# Patient Record
Sex: Female | Born: 1945
Health system: Southern US, Community
[De-identification: ages and names within clinical notes are randomized; demographics above are authoritative.]

## PROBLEM LIST (undated history)

## (undated) DIAGNOSIS — R413 Other amnesia: Secondary | ICD-10-CM

## (undated) DIAGNOSIS — N189 Chronic kidney disease, unspecified: Secondary | ICD-10-CM

## (undated) DIAGNOSIS — R519 Headache, unspecified: Secondary | ICD-10-CM

## (undated) DIAGNOSIS — I5030 Unspecified diastolic (congestive) heart failure: Secondary | ICD-10-CM

## (undated) DIAGNOSIS — I10 Essential (primary) hypertension: Secondary | ICD-10-CM

## (undated) DIAGNOSIS — G479 Sleep disorder, unspecified: Secondary | ICD-10-CM

## (undated) DIAGNOSIS — R51 Headache: Secondary | ICD-10-CM

## (undated) DIAGNOSIS — Z9289 Personal history of other medical treatment: Secondary | ICD-10-CM

## (undated) DIAGNOSIS — D126 Benign neoplasm of colon, unspecified: Secondary | ICD-10-CM

## (undated) DIAGNOSIS — D649 Anemia, unspecified: Secondary | ICD-10-CM

## (undated) DIAGNOSIS — G4700 Insomnia, unspecified: Secondary | ICD-10-CM

## (undated) DIAGNOSIS — I472 Ventricular tachycardia: Secondary | ICD-10-CM

## (undated) DIAGNOSIS — G629 Polyneuropathy, unspecified: Secondary | ICD-10-CM

## (undated) DIAGNOSIS — M069 Rheumatoid arthritis, unspecified: Secondary | ICD-10-CM

## (undated) DIAGNOSIS — E785 Hyperlipidemia, unspecified: Secondary | ICD-10-CM

## (undated) DIAGNOSIS — E119 Type 2 diabetes mellitus without complications: Secondary | ICD-10-CM

## (undated) DIAGNOSIS — F329 Major depressive disorder, single episode, unspecified: Secondary | ICD-10-CM

## (undated) DIAGNOSIS — F32A Depression, unspecified: Secondary | ICD-10-CM

## (undated) DIAGNOSIS — G2581 Restless legs syndrome: Secondary | ICD-10-CM

## (undated) DIAGNOSIS — I4721 Torsades de pointes: Secondary | ICD-10-CM

## (undated) DIAGNOSIS — I4819 Other persistent atrial fibrillation: Secondary | ICD-10-CM

## (undated) DIAGNOSIS — K219 Gastro-esophageal reflux disease without esophagitis: Secondary | ICD-10-CM

## (undated) DIAGNOSIS — I4891 Unspecified atrial fibrillation: Secondary | ICD-10-CM

## (undated) DIAGNOSIS — M1990 Unspecified osteoarthritis, unspecified site: Secondary | ICD-10-CM

## (undated) DIAGNOSIS — I519 Heart disease, unspecified: Secondary | ICD-10-CM

## (undated) HISTORY — DX: Other persistent atrial fibrillation: I48.19

## (undated) HISTORY — DX: Rheumatoid arthritis, unspecified: M06.9

## (undated) HISTORY — DX: Unspecified atrial fibrillation: I48.91

## (undated) HISTORY — DX: Chronic kidney disease, unspecified: N18.9

## (undated) HISTORY — DX: Insomnia, unspecified: G47.00

## (undated) HISTORY — DX: Other amnesia: R41.3

## (undated) HISTORY — DX: Restless legs syndrome: G25.81

## (undated) HISTORY — DX: Polyneuropathy, unspecified: G62.9

## (undated) HISTORY — PX: TUBAL LIGATION: SHX77

## (undated) HISTORY — DX: Unspecified diastolic (congestive) heart failure: I50.30

---

## 1998-02-02 ENCOUNTER — Other Ambulatory Visit: Admission: RE | Admit: 1998-02-02 | Discharge: 1998-02-02 | Payer: Self-pay | Admitting: Internal Medicine

## 2000-04-17 ENCOUNTER — Encounter: Payer: Self-pay | Admitting: Internal Medicine

## 2000-04-17 ENCOUNTER — Encounter: Admission: RE | Admit: 2000-04-17 | Discharge: 2000-04-17 | Payer: Self-pay | Admitting: Internal Medicine

## 2004-09-03 ENCOUNTER — Emergency Department (HOSPITAL_COMMUNITY): Admission: EM | Admit: 2004-09-03 | Discharge: 2004-09-04 | Payer: Self-pay | Admitting: Emergency Medicine

## 2005-06-06 ENCOUNTER — Other Ambulatory Visit: Admission: RE | Admit: 2005-06-06 | Discharge: 2005-06-06 | Payer: Self-pay | Admitting: Internal Medicine

## 2012-08-25 ENCOUNTER — Inpatient Hospital Stay (HOSPITAL_COMMUNITY)
Admission: AD | Admit: 2012-08-25 | Discharge: 2012-08-30 | DRG: 308 | Disposition: A | Discharge status: Home / Self Care | Payer: Medicare Other | Source: Ambulatory Visit | Attending: Cardiovascular Disease | Admitting: Cardiovascular Disease

## 2012-08-25 ENCOUNTER — Encounter (HOSPITAL_COMMUNITY): Payer: Self-pay | Admitting: Cardiology

## 2012-08-25 ENCOUNTER — Inpatient Hospital Stay (HOSPITAL_COMMUNITY): Payer: Medicare Other

## 2012-08-25 DIAGNOSIS — I428 Other cardiomyopathies: Secondary | ICD-10-CM | POA: Diagnosis present

## 2012-08-25 DIAGNOSIS — I10 Essential (primary) hypertension: Secondary | ICD-10-CM | POA: Diagnosis present

## 2012-08-25 DIAGNOSIS — Z833 Family history of diabetes mellitus: Secondary | ICD-10-CM

## 2012-08-25 DIAGNOSIS — I509 Heart failure, unspecified: Secondary | ICD-10-CM | POA: Diagnosis present

## 2012-08-25 DIAGNOSIS — E785 Hyperlipidemia, unspecified: Secondary | ICD-10-CM | POA: Diagnosis present

## 2012-08-25 DIAGNOSIS — I5033 Acute on chronic diastolic (congestive) heart failure: Secondary | ICD-10-CM | POA: Diagnosis present

## 2012-08-25 DIAGNOSIS — I4891 Unspecified atrial fibrillation: Principal | ICD-10-CM | POA: Diagnosis present

## 2012-08-25 DIAGNOSIS — Z79899 Other long term (current) drug therapy: Secondary | ICD-10-CM

## 2012-08-25 DIAGNOSIS — I4819 Other persistent atrial fibrillation: Secondary | ICD-10-CM | POA: Diagnosis present

## 2012-08-25 DIAGNOSIS — Z794 Long term (current) use of insulin: Secondary | ICD-10-CM

## 2012-08-25 DIAGNOSIS — E119 Type 2 diabetes mellitus without complications: Secondary | ICD-10-CM | POA: Diagnosis present

## 2012-08-25 DIAGNOSIS — Z7982 Long term (current) use of aspirin: Secondary | ICD-10-CM

## 2012-08-25 DIAGNOSIS — Z7901 Long term (current) use of anticoagulants: Secondary | ICD-10-CM

## 2012-08-25 DIAGNOSIS — Z8249 Family history of ischemic heart disease and other diseases of the circulatory system: Secondary | ICD-10-CM

## 2012-08-25 HISTORY — DX: Essential (primary) hypertension: I10

## 2012-08-25 HISTORY — DX: Type 2 diabetes mellitus without complications: E11.9

## 2012-08-25 HISTORY — DX: Depression, unspecified: F32.A

## 2012-08-25 HISTORY — DX: Hyperlipidemia, unspecified: E78.5

## 2012-08-25 HISTORY — DX: Heart disease, unspecified: I51.9

## 2012-08-25 HISTORY — DX: Major depressive disorder, single episode, unspecified: F32.9

## 2012-08-25 HISTORY — DX: Gastro-esophageal reflux disease without esophagitis: K21.9

## 2012-08-25 LAB — COMPREHENSIVE METABOLIC PANEL
ALT: 11 U/L (ref 0–35)
AST: 13 U/L (ref 0–37)
Albumin: 3.7 g/dL (ref 3.5–5.2)
Alkaline Phosphatase: 63 U/L (ref 39–117)
BUN: 15 mg/dL (ref 6–23)
CO2: 27 mEq/L (ref 19–32)
Calcium: 9.8 mg/dL (ref 8.4–10.5)
Chloride: 100 mEq/L (ref 96–112)
Creatinine, Ser: 0.82 mg/dL (ref 0.50–1.10)
GFR calc Af Amer: 83 mL/min — ABNORMAL LOW (ref >90)
GFR calc non Af Amer: 72 mL/min — ABNORMAL LOW (ref >90)
Glucose, Bld: 233 mg/dL — ABNORMAL HIGH (ref 70–99)
Potassium: 4.3 mEq/L (ref 3.5–5.1)
Sodium: 137 mEq/L (ref 135–145)
Total Bilirubin: 0.3 mg/dL (ref 0.3–1.2)
Total Protein: 7.6 g/dL (ref 6.0–8.3)

## 2012-08-25 LAB — CBC WITH DIFFERENTIAL/PLATELET
Basophils Absolute: 0 10*3/uL (ref 0.0–0.1)
Basophils Relative: 0 % (ref 0–1)
Eosinophils Absolute: 0.2 10*3/uL (ref 0.0–0.7)
Eosinophils Relative: 2 % (ref 0–5)
HCT: 31.4 % — ABNORMAL LOW (ref 36.0–46.0)
Hemoglobin: 10 g/dL — ABNORMAL LOW (ref 12.0–15.0)
Lymphocytes Relative: 15 % (ref 12–46)
Lymphs Abs: 1.4 10*3/uL (ref 0.7–4.0)
MCH: 26.2 pg (ref 26.0–34.0)
MCHC: 31.8 g/dL (ref 30.0–36.0)
MCV: 82.2 fL (ref 78.0–100.0)
Monocytes Absolute: 0.5 10*3/uL (ref 0.1–1.0)
Monocytes Relative: 5 % (ref 3–12)
Neutro Abs: 7.4 10*3/uL (ref 1.7–7.7)
Neutrophils Relative %: 78 % — ABNORMAL HIGH (ref 43–77)
Platelets: 394 10*3/uL (ref 150–400)
RBC: 3.82 MIL/uL — ABNORMAL LOW (ref 3.87–5.11)
RDW: 16.2 % — ABNORMAL HIGH (ref 11.5–15.5)
WBC: 9.5 10*3/uL (ref 4.0–10.5)

## 2012-08-25 LAB — TROPONIN I
Troponin I: <0.3 ng/mL (ref <0.30)
Troponin I: <0.3 ng/mL (ref <0.30)

## 2012-08-25 LAB — GLUCOSE, CAPILLARY
Glucose-Capillary: 112 mg/dL — ABNORMAL HIGH (ref 70–99)
Glucose-Capillary: 85 mg/dL (ref 70–99)

## 2012-08-25 LAB — MAGNESIUM: Magnesium: 1.8 mg/dL (ref 1.5–2.5)

## 2012-08-25 LAB — HEMOGLOBIN A1C
Hgb A1c MFr Bld: 6.3 % — ABNORMAL HIGH (ref <5.7)
Mean Plasma Glucose: 134 mg/dL — ABNORMAL HIGH (ref <117)

## 2012-08-25 LAB — PROTIME-INR
INR: 1.05 (ref 0.00–1.49)
Prothrombin Time: 13.6 seconds (ref 11.6–15.2)

## 2012-08-25 LAB — TSH: TSH: 0.737 u[IU]/mL (ref 0.350–4.500)

## 2012-08-25 LAB — PRO B NATRIURETIC PEPTIDE: Pro B Natriuretic peptide (BNP): 1190 pg/mL — ABNORMAL HIGH (ref 0–125)

## 2012-08-25 LAB — APTT: aPTT: 31 seconds (ref 24–37)

## 2012-08-25 MED ORDER — ASPIRIN EC 81 MG PO TBEC
81.0000 mg | DELAYED_RELEASE_TABLET | Freq: Every day | ORAL | Status: DC
  Administered 2012-08-26 – 2012-08-30 (×5): 81 mg via ORAL
  Filled 2012-08-25 (×5): qty 1

## 2012-08-25 MED ORDER — AMITRIPTYLINE HCL 25 MG PO TABS
25.0000 mg | ORAL_TABLET | Freq: Every day | ORAL | Status: DC
Start: 2012-08-25 — End: 2012-08-30
  Administered 2012-08-25 – 2012-08-29 (×5): 25 mg via ORAL
  Filled 2012-08-25 (×6): qty 1

## 2012-08-25 MED ORDER — FAMOTIDINE 40 MG PO TABS
40.0000 mg | ORAL_TABLET | Freq: Every day | ORAL | Status: DC
  Administered 2012-08-25 – 2012-08-30 (×6): 40 mg via ORAL
  Filled 2012-08-25 (×6): qty 1

## 2012-08-25 MED ORDER — METOPROLOL TARTRATE 1 MG/ML IV SOLN
5.0000 mg | Freq: Once | INTRAVENOUS | Status: AC
  Administered 2012-08-25: 5 mg via INTRAVENOUS
  Filled 2012-08-25: qty 5

## 2012-08-25 MED ORDER — SERTRALINE HCL 100 MG PO TABS: 100.0000 mg | ORAL_TABLET | Freq: Every day | ORAL | Status: DC

## 2012-08-25 MED ORDER — SODIUM CHLORIDE 0.9 % IV SOLN: 250.0000 mL | INTRAVENOUS | Status: DC | PRN

## 2012-08-25 MED ORDER — METOPROLOL TARTRATE 25 MG PO TABS
25.0000 mg | ORAL_TABLET | Freq: Two times a day (BID) | ORAL | Status: DC
  Administered 2012-08-25 – 2012-08-26 (×3): 25 mg via ORAL
  Filled 2012-08-25 (×4): qty 1

## 2012-08-25 MED ORDER — HEPARIN BOLUS VIA INFUSION
4000.0000 [IU] | Freq: Once | INTRAVENOUS | Status: AC
  Administered 2012-08-25: 4000 [IU] via INTRAVENOUS
  Filled 2012-08-25: qty 4000

## 2012-08-25 MED ORDER — SODIUM CHLORIDE 0.9 % IJ SOLN
3.0000 mL | Freq: Two times a day (BID) | INTRAMUSCULAR | Status: DC
  Administered 2012-08-25 – 2012-08-30 (×7): 3 mL via INTRAVENOUS

## 2012-08-25 MED ORDER — ALPRAZOLAM 0.25 MG PO TABS: 0.2500 mg | ORAL_TABLET | Freq: Two times a day (BID) | ORAL | Status: DC | PRN

## 2012-08-25 MED ORDER — SODIUM CHLORIDE 0.9 % IJ SOLN
3.0000 mL | INTRAMUSCULAR | Status: DC | PRN
  Administered 2012-08-26: 3 mL via INTRAVENOUS

## 2012-08-25 MED ORDER — SERTRALINE HCL 100 MG PO TABS
100.0000 mg | ORAL_TABLET | Freq: Every day | ORAL | Status: DC
  Administered 2012-08-25 – 2012-08-30 (×6): 100 mg via ORAL
  Filled 2012-08-25 (×6): qty 1

## 2012-08-25 MED ORDER — FUROSEMIDE 10 MG/ML IJ SOLN
40.0000 mg | Freq: Two times a day (BID) | INTRAMUSCULAR | Status: DC
  Administered 2012-08-25 – 2012-08-27 (×6): 40 mg via INTRAVENOUS
  Filled 2012-08-25 (×9): qty 4

## 2012-08-25 MED ORDER — ZOLPIDEM TARTRATE 5 MG PO TABS
5.0000 mg | ORAL_TABLET | Freq: Every evening | ORAL | Status: DC | PRN
  Administered 2012-08-26 – 2012-08-29 (×3): 5 mg via ORAL
  Filled 2012-08-25 (×4): qty 1

## 2012-08-25 MED ORDER — ACETAMINOPHEN 325 MG PO TABS: 650.0000 mg | ORAL_TABLET | ORAL | Status: DC | PRN

## 2012-08-25 MED ORDER — INSULIN ASPART 100 UNIT/ML ~~LOC~~ SOLN
0.0000 [IU] | Freq: Three times a day (TID) | SUBCUTANEOUS | Status: DC
  Administered 2012-08-26: 1 [IU] via SUBCUTANEOUS
  Administered 2012-08-26: 2 [IU] via SUBCUTANEOUS
  Administered 2012-08-27 (×2): 1 [IU] via SUBCUTANEOUS
  Administered 2012-08-27: 2 [IU] via SUBCUTANEOUS
  Administered 2012-08-28: 3 [IU] via SUBCUTANEOUS
  Administered 2012-08-28: 1 [IU] via SUBCUTANEOUS
  Administered 2012-08-28 – 2012-08-29 (×2): 2 [IU] via SUBCUTANEOUS
  Administered 2012-08-29 – 2012-08-30 (×3): 1 [IU] via SUBCUTANEOUS

## 2012-08-25 MED ORDER — LORATADINE 10 MG PO TABS
10.0000 mg | ORAL_TABLET | Freq: Every day | ORAL | Status: DC
  Administered 2012-08-25 – 2012-08-30 (×6): 10 mg via ORAL
  Filled 2012-08-25 (×6): qty 1

## 2012-08-25 MED ORDER — SIMVASTATIN 20 MG PO TABS
20.0000 mg | ORAL_TABLET | Freq: Every day | ORAL | Status: DC
  Administered 2012-08-25 – 2012-08-29 (×5): 20 mg via ORAL
  Filled 2012-08-25 (×6): qty 1

## 2012-08-25 MED ORDER — INSULIN ASPART PROT & ASPART (70-30 MIX) 100 UNIT/ML ~~LOC~~ SUSP
25.0000 [IU] | Freq: Two times a day (BID) | SUBCUTANEOUS | Status: DC
  Administered 2012-08-25 – 2012-08-30 (×9): 25 [IU] via SUBCUTANEOUS
  Filled 2012-08-25 (×3): qty 10

## 2012-08-25 MED ORDER — ASPIRIN 81 MG PO CHEW
324.0000 mg | CHEWABLE_TABLET | ORAL | Status: AC
  Administered 2012-08-25: 324 mg via ORAL
  Filled 2012-08-25: qty 4

## 2012-08-25 MED ORDER — ASPIRIN 300 MG RE SUPP
300.0000 mg | RECTAL | Status: AC
  Filled 2012-08-25: qty 1

## 2012-08-25 MED ORDER — ONDANSETRON HCL 4 MG/2ML IJ SOLN
4.0000 mg | Freq: Four times a day (QID) | INTRAMUSCULAR | Status: DC | PRN
Start: 2012-08-25 — End: 2012-08-30

## 2012-08-25 MED ORDER — SIMVASTATIN 20 MG PO TABS: 20.0000 mg | ORAL_TABLET | Freq: Every day | ORAL | Status: DC

## 2012-08-25 MED ORDER — HEPARIN (PORCINE) IN NACL 100-0.45 UNIT/ML-% IJ SOLN
1400.0000 [IU]/h | INTRAMUSCULAR | Status: AC
  Administered 2012-08-25: 1000 [IU]/h via INTRAVENOUS
  Administered 2012-08-26 – 2012-08-27 (×3): 1250 [IU]/h via INTRAVENOUS
  Filled 2012-08-25 (×4): qty 250

## 2012-08-25 MED ORDER — NITROGLYCERIN 0.4 MG SL SUBL: 0.4000 mg | SUBLINGUAL_TABLET | SUBLINGUAL | Status: DC | PRN

## 2012-08-25 NOTE — H&P (Signed)
Pt. Seen and examined. Agree with the NP/PA-C note as written.  Patient seen on arrival, comfortable, HR now in the 90's with a-fib. Heparin gtts has been initiated. She is on lopressor 25 mg BID for rate control. Apparently intolerant of cardizem in the past. May need TEE/Cardioversion, however, Friday would be the earliest. Plan for diuresis - BNP is 1190. Troponin negative x 1. Will need improved BG control.  Chrystie Nose, MD, [facility] Attending Cardiologist [facility]

## 2012-08-25 NOTE — H&P (Signed)
Alison Bell is an 67 y.o. female.   Chief Complaint: SOB HPI: 79 YOWF with hx of PAF and prior anticoagulation with Xarelto, but stopped due to sinus drainage with blood.  She was also intolerant to Cardizem in the past with severe fatigue.   (though no problems with norvasc)  She presented to the office today with 1 week of increasing SOB, orthopnea, DOE.  She has to rest after eating she is so fatigued.   She has been tired for about 2 months but the SOB is in the last week.  Here in the office she is found to be in atrial fib with RVR and acute diastolic HF.   She is on no anticoagulation.    She is unaware of the atrial fib.  She also has chest pain, though she believes this to be indigestion and it does improve with pepcid.     In April of 2013 with episode of atrial fib she had 2 D echo with EF > 55% with suggestion of pseudonormalization, moderately dilated LA, mild MR.    Lexiscan myoview at that time was normal.     Past Medical History  Diagnosis Date  . Dysrhythmia   . Diabetes mellitus without complication   . Acute on chronic diastolic congestive heart failure, secondary to afib with RVR 08/25/2012  . Atrial fibrillation with RVR, symptomatic 08/25/2012  . DM (diabetes mellitus) 08/25/2012  . PAF (paroxysmal atrial fibrillation) 08/25/2012  . HTN (hypertension) 08/25/2012  . Hyperlipidemia 08/25/2012    History reviewed. No pertinent past surgical history.  Family History  Problem Relation Age of Onset  . Heart attack Mother   . Diabetes type II Father    Social History:  reports that she quit smoking about 18 years ago. She does not have any smokeless tobacco history on file. She reports that she does not drink alcohol or use illicit drugs.married , 4 children, 2 grandchildren.  Active usually.  Allergies:  Allergies  Allergen Reactions  . Benicar (Olmesartan) Anaphylaxis  . Cardizem (Diltiazem Hcl) Other (See Comments)    Extreme fatique  . Shrimp (Shellfish Allergy)      Outpatient medications: Metformin 1000 mg BID Famotidine 20 mg 2 daily Amlodipine 5 mg daily sertraline 100 mg daily Simvastatin 20 mg daily Novolin 70/30 50 units am 25 units pm ASA 81 mg daily Cetrizine 10 mg daily Amitriptyline 25 mg every HS    No results found for this or any previous visit (from the past 48 hour(s)). No results found.  Labs pending.  ROS: General:no colds or fevers, no weight changes Skin:no rashes or ulcers HEENT:no blurred vision, no congestion CV:see HPI PUL:see HPI- having to get out of bed or sit on side of bed for SOB during the night GI:no diarrhea constipation or melena, no indigestion GU:no hematuria, no dysuria MS:no joint pain, no claudication Neuro:no syncope, no lightheadedness Endo:+ diabetes recently with hypoglycemia, no thyroid disease   BP: 122.70  P 119 irreg irreg  Wt 203.7 up from 197.6 in 10/2011 Ht 5'2"  BMI 37 PE: General:alert and oriented pleasant affect, obviously SOB with lying back on table  Skin:warm and dry brisk capillary refill HEENT:normocephalic sclera clear Neck:supple, no JVD, no carotid bruits Heart:irreg irreg, no obvious murmurs. Lungs:decreased breath sounds throughout, no wheezes Abd:+ BS, soft non tender Ext:tr edema today, she stated they were more swollen yesterday, 2 + pedal pulses Neuro:alert and oriented, MAE follows commands    Assessment/Plan Principal Problem:  *Acute on  chronic diastolic congestive heart failure, secondary to afib with RVR Active Problems:  Atrial fibrillation with RVR, symptomatic  DM (diabetes mellitus)  PAF (paroxysmal atrial fibrillation)  HTN (hypertension)  Hyperlipidemia  PLAN: Pt was seen and examined by Dr. Herbie Baltimore in the office.  Will admit to slow rate, trying to stay away from cardizem though IV should not be as much of a problem,  Will hold norvasc for now and begin Lopressor.  Will add IV heparin unless negative for MI then will place on eliquis.  Would ask  Dr. Allyson Sabal for input on adding Betapace vs. Rythmol for recurrent a. Fib.    For now rule out MI + family hx. Though negative nuc in April of 2014.  If troponin negative could proceed with eliquis instead of IV Heparin.   If HR not controlled with Lopressor alone ? Add IV cardiazem.   Additionally plan for TEE DCCV on Friday if A fib remains.  Will schedule in anticipation.  Glucose has been low will hold Metformin for now, and decrease dose of 70/30.  Continue to monitor glucose.  Will add IV lasix at 40 BID unless poor diuresing then would increase dose.  CXR, Labs pending.   Seng Fouts R 08/25/2012, 12:11 PM

## 2012-08-25 NOTE — Progress Notes (Signed)
ANTICOAGULATION CONSULT NOTE - Initial Consult  Pharmacy Consult for Heparin Indication: atrial fibrillation  Allergies  Allergen Reactions  . Benicar (Olmesartan) Anaphylaxis  . Cardizem (Diltiazem Hcl) Other (See Comments)    Extreme fatique  . Shrimp (Shellfish Allergy)    Patient Measurements: Height: 5\' 2"  (157.5 cm) Weight: 204 lb (92.534 kg) IBW/kg (Calculated) : 50.1  Heparin Dosing Weight: 63 kg  Vital Signs: Temp: 98.2 F (36.8 C) (02/05 1220) Temp src: Oral (02/05 1220) BP: 112/79 mmHg (02/05 1220) Pulse Rate: 109  (02/05 1220)  Labs: No results found for this basename: HGB:2,HCT:3,PLT:3,APTT:3,LABPROT:3,INR:3,HEPARINUNFRC:3,CREATININE:3,CKTOTAL:3,CKMB:3,TROPONINI:3 in the last 72 hours  CrCl is unknown because no creatinine reading has been taken.  Medical History: Past Medical History  Diagnosis Date  . Dysrhythmia   . Diabetes mellitus without complication   . Acute on chronic diastolic congestive heart failure, secondary to afib with RVR 08/25/2012  . Atrial fibrillation with RVR, symptomatic 08/25/2012  . DM (diabetes mellitus) 08/25/2012  . PAF (paroxysmal atrial fibrillation) 08/25/2012  . HTN (hypertension) 08/25/2012  . Hyperlipidemia 08/25/2012  . Depression   . Shortness of breath   . GERD (gastroesophageal reflux disease)    Assessment: 67 yo female admitted with hx. of PAF, chest pain and fatigue.  At her office visit today, she was found to be in Afib with RVR and acute diastolic HF.  She has not been receiving any anticoagulation.  Spoke with Nada Boozer regarding anticoagulation and she request that we initiate Heparin therapy until w/u for MI is complete.  No baseline labs currently, (ordered).    Goal of Therapy:  Heparin level 0.3-0.7 units/ml Monitor platelets by anticoagulation protocol: Yes   Plan:  1).  Heparin 4000 units IV bolus 2).  Start IV heparin at 1000 units/hr 3).  Obtain a heparin level in 6 hours and adjust accordingly 4).  F/U  admission labs 5).  Daily heparin level/CBC  Nadara Mustard, PharmD., MS Clinical Pharmacist Pager:  [phone] Thank you for allowing pharmacy to be part of this patients care team. 08/25/2012,1:47 PM

## 2012-08-26 LAB — BASIC METABOLIC PANEL
BUN: 20 mg/dL (ref 6–23)
CO2: 29 mEq/L (ref 19–32)
Calcium: 10 mg/dL (ref 8.4–10.5)
Chloride: 98 mEq/L (ref 96–112)
Creatinine, Ser: 1.07 mg/dL (ref 0.50–1.10)
GFR calc Af Amer: 60 mL/min — ABNORMAL LOW (ref >90)
GFR calc non Af Amer: 52 mL/min — ABNORMAL LOW (ref >90)
Glucose, Bld: 107 mg/dL — ABNORMAL HIGH (ref 70–99)
Potassium: 3.8 mEq/L (ref 3.5–5.1)
Sodium: 140 mEq/L (ref 135–145)

## 2012-08-26 LAB — CBC
HCT: 32.8 % — ABNORMAL LOW (ref 36.0–46.0)
Hemoglobin: 10.5 g/dL — ABNORMAL LOW (ref 12.0–15.0)
MCH: 26.1 pg (ref 26.0–34.0)
MCHC: 32 g/dL (ref 30.0–36.0)
MCV: 81.4 fL (ref 78.0–100.0)
Platelets: 436 10*3/uL — ABNORMAL HIGH (ref 150–400)
RBC: 4.03 MIL/uL (ref 3.87–5.11)
RDW: 16.1 % — ABNORMAL HIGH (ref 11.5–15.5)
WBC: 10.3 10*3/uL (ref 4.0–10.5)

## 2012-08-26 LAB — LIPID PANEL
Cholesterol: 112 mg/dL (ref 0–200)
HDL: 34 mg/dL — ABNORMAL LOW (ref >39)
LDL Cholesterol: 56 mg/dL (ref 0–99)
Total CHOL/HDL Ratio: 3.3 RATIO
Triglycerides: 108 mg/dL (ref <150)
VLDL: 22 mg/dL (ref 0–40)

## 2012-08-26 LAB — HEPARIN LEVEL (UNFRACTIONATED)
Heparin Unfractionated: <0.1 IU/mL — ABNORMAL LOW (ref 0.30–0.70)
Heparin Unfractionated: 0.32 IU/mL (ref 0.30–0.70)

## 2012-08-26 LAB — GLUCOSE, CAPILLARY
Glucose-Capillary: 103 mg/dL — ABNORMAL HIGH (ref 70–99)
Glucose-Capillary: 143 mg/dL — ABNORMAL HIGH (ref 70–99)
Glucose-Capillary: 172 mg/dL — ABNORMAL HIGH (ref 70–99)
Glucose-Capillary: 102 mg/dL — ABNORMAL HIGH (ref 70–99)

## 2012-08-26 LAB — PRO B NATRIURETIC PEPTIDE: Pro B Natriuretic peptide (BNP): 1841 pg/mL — ABNORMAL HIGH (ref 0–125)

## 2012-08-26 LAB — TROPONIN I: Troponin I: <0.3 ng/mL (ref <0.30)

## 2012-08-26 MED ORDER — HEPARIN BOLUS VIA INFUSION
2000.0000 [IU] | Freq: Once | INTRAVENOUS | Status: AC
  Administered 2012-08-26: 2000 [IU] via INTRAVENOUS
  Filled 2012-08-26: qty 2000

## 2012-08-26 MED ORDER — OFF THE BEAT BOOK
Freq: Once | Status: AC
  Administered 2012-08-26: 16:00:00
  Filled 2012-08-26: qty 1

## 2012-08-26 MED ORDER — METOPROLOL TARTRATE 50 MG PO TABS
50.0000 mg | ORAL_TABLET | Freq: Two times a day (BID) | ORAL | Status: DC
  Administered 2012-08-26 – 2012-08-27 (×3): 50 mg via ORAL
  Filled 2012-08-26 (×6): qty 1

## 2012-08-26 NOTE — Progress Notes (Signed)
ANTICOAGULATION CONSULT NOTE - Follow UP  Pharmacy Consult for Heparin Indication: atrial fibrillation  Allergies  Allergen Reactions  . Benicar (Olmesartan) Anaphylaxis  . Cardizem (Diltiazem Hcl) Other (See Comments)    Extreme fatique  . Other Itching and Swelling    White meat chicken  To hands.   . Shrimp (Shellfish Allergy) Swelling    Hand swelling   Patient Measurements: Height: 5\' 2"  (157.5 cm) Weight: 194 lb 3.6 oz (88.1 kg) IBW/kg (Calculated) : 50.1  Heparin Dosing Weight: 63 kg  Vital Signs: Temp: 98 F (36.7 C) (02/06 0600) Temp src: Oral (02/06 0600) BP: 103/61 mmHg (02/06 0600) Pulse Rate: 96  (02/06 0600)  Labs:  Basename 08/26/12 0904 08/26/12 0504 08/26/12 0500 08/25/12 2320 08/25/12 2319 08/25/12 1840 08/25/12 1324 08/25/12 1323  HGB -- -- 10.5* -- -- -- 10.0* --  HCT -- -- 32.8* -- -- -- 31.4* --  PLT -- -- 436* -- -- -- 394 --  APTT -- -- -- -- -- -- 31 --  LABPROT -- -- -- -- -- -- 13.6 --  INR -- -- -- -- -- -- 1.05 --  HEPARINUNFRC 0.32 -- -- -- <0.10* -- -- --  CREATININE -- 1.07 -- -- -- -- 0.82 --  CKTOTAL -- -- -- -- -- -- -- --  CKMB -- -- -- -- -- -- -- --  TROPONINI -- -- -- <0.30 -- <0.30 -- <0.30    Estimated Creatinine Clearance: 51.9 ml/min (by C-G formula based on Cr of 1.07).  Medical History: Past Medical History  Diagnosis Date  . Dysrhythmia   . Diabetes mellitus without complication   . Acute on chronic diastolic congestive heart failure, secondary to afib with RVR 08/25/2012  . Atrial fibrillation with RVR, symptomatic 08/25/2012  . DM (diabetes mellitus) 08/25/2012  . PAF (paroxysmal atrial fibrillation) 08/25/2012  . HTN (hypertension) 08/25/2012  . Hyperlipidemia 08/25/2012  . Depression   . Shortness of breath   . GERD (gastroesophageal reflux disease)    Assessment: 67 yo female admitted with hx. of PAF, chest pain and fatigue.  At her office visit today, she was found to be in Afib with RVR and acute diastolic HF.   She has not been receiving any anticoagulation.  Heparin therapy started for cardiac work-up.  Her heparin level is within goal range at 0.32 today.  No noted bleeding complications with her therapy.  Troponin is negative, her BNP is elevated.  Goal of Therapy:  Heparin level 0.3-0.7 units/ml Monitor platelets by anticoagulation protocol: Yes   Plan:  1).  Continue current IV heparin at 1250 units/hr 2).   F/U am heparin level and adjust  Nadara Mustard, PharmD., MS Clinical Pharmacist Pager:  [phone] Thank you for allowing pharmacy to be part of this patients care team. 08/26/2012,10:21 AM

## 2012-08-26 NOTE — Progress Notes (Signed)
  Echocardiogram 2D Echocardiogram has been performed.  Alison Bell 08/26/2012, 3:18 PM

## 2012-08-26 NOTE — Progress Notes (Signed)
[facility]  Subjective: No complaints. Denies palpitations, SOB, CP, feeling lightheaded or dizzy.  Objective: Vital signs in last 24 hours: Temp:  [97.5 F (36.4 C)-98.2 F (36.8 C)] 98 F (36.7 C) (02/06 0600) Pulse Rate:  [88-109] 96  (02/06 0600) Resp:  [18-20] 18  (02/05 2117) BP: (103-115)/(55-79) 103/61 mmHg (02/06 0600) SpO2:  [93 %-96 %] 96 % (02/06 0600) Weight:  [194 lb 3.6 oz (88.1 kg)-204 lb (92.534 kg)] 194 lb 3.6 oz (88.1 kg) (02/06 0600) Last BM Date: 08/25/12  Intake/Output from previous day: 02/05 0701 - 02/06 0700 In: 360 [P.O.:360] Out: 4850 [Urine:4850] Intake/Output this shift:    Medications Current Facility-Administered Medications  Medication Dose Route Frequency Provider Last Rate Last Dose  . 0.9 %  sodium chloride infusion  250 mL Intravenous PRN Nada Boozer, NP      . acetaminophen (TYLENOL) tablet 650 mg  650 mg Oral Q4H PRN Nada Boozer, NP      . ALPRAZolam Prudy Feeler) tablet 0.25 mg  0.25 mg Oral BID PRN Nada Boozer, NP      . amitriptyline (ELAVIL) tablet 25 mg  25 mg Oral QHS Nada Boozer, NP   25 mg at 08/25/12 2157  . aspirin EC tablet 81 mg  81 mg Oral Daily Nada Boozer, NP      . famotidine (PEPCID) tablet 40 mg  40 mg Oral Daily Nada Boozer, NP   40 mg at 08/25/12 1357  . furosemide (LASIX) injection 40 mg  40 mg Intravenous Q12H Nada Boozer, NP   40 mg at 08/26/12 0033  . heparin ADULT infusion 100 units/mL (25000 units/250 mL)  1,250 Units/hr Intravenous Continuous Colleen Can, PHARMD 12.5 mL/hr at 08/26/12 4098 1,250 Units/hr at 08/26/12 1191  . insulin aspart (novoLOG) injection 0-9 Units  0-9 Units Subcutaneous TID WC Nada Boozer, NP      . insulin aspart protamine-insulin aspart (NOVOLOG 70/30) injection 25 Units  25 Units Subcutaneous BID WC Nada Boozer, NP   25 Units at 08/26/12 [phone]  . loratadine (CLARITIN) tablet 10 mg  10 mg Oral Daily Nada Boozer, NP   10 mg at 08/25/12 1736  .  metoprolol tartrate (LOPRESSOR) tablet 25 mg  25 mg Oral BID Nada Boozer, NP   25 mg at 08/25/12 2157  . nitroGLYCERIN (NITROSTAT) SL tablet 0.4 mg  0.4 mg Sublingual Q5 Min x 3 PRN Nada Boozer, NP      . ondansetron [facility]) injection 4 mg  4 mg Intravenous Q6H PRN Nada Boozer, NP      . sertraline (ZOLOFT) tablet 100 mg  100 mg Oral Daily Nada Boozer, NP   100 mg at 08/25/12 1357  . simvastatin (ZOCOR) tablet 20 mg  20 mg Oral q1800 Nada Boozer, NP   20 mg at 08/25/12 1736  . sodium chloride 0.9 % injection 3 mL  3 mL Intravenous Q12H Nada Boozer, NP   3 mL at 08/25/12 1357  . sodium chloride 0.9 % injection 3 mL  3 mL Intravenous PRN Nada Boozer, NP      . zolpidem [facility]) tablet 5 mg  5 mg Oral QHS PRN Nada Boozer, NP   5 mg at 08/26/12 0205    PE: General appearance: alert, cooperative, no distress and sitting up on edge of bed Neck: no JVD Lungs: clear to auscultation bilaterally Heart: irregularly irregular rhythm Extremities: no LEE Pulses: 2+ and symmetric Skin: warm and dry Neurologic: Grossly normal  Lab Results:  Basename 08/26/12 0500 08/25/12 1324  WBC 10.3 9.5  HGB 10.5* 10.0*  HCT 32.8* 31.4*  PLT 436* 394   BMET  Basename 08/26/12 0504 08/25/12 1324  NA 140 137  K 3.8 4.3  CL 98 100  CO2 29 27  GLUCOSE 107* 233*  BUN 20 15  CREATININE 1.07 0.82  CALCIUM 10.0 9.8   PT/INR  Basename 08/25/12 1324  LABPROT 13.6  INR 1.05   Cholesterol  Basename 08/26/12 0504  CHOL 112   Cardiac Panel (last 3 results)  Basename 08/25/12 2320 08/25/12 1840 08/25/12 1323  CKTOTAL -- -- --  CKMB -- -- --  TROPONINI <0.30 <0.30 <0.30  RELINDX -- -- --   Studies:CXR 2/5 RADIOLOGY REPORT*  Clinical Data: Shortness of breath, CHF, atrial fibrillation  PORTABLE CHEST - 1 VIEW  Comparison: Portable exam 1259 hours without priors for comparison  Findings:  Enlargement of cardiac silhouette with pulmonary vascular  congestion.  Mildly tortuous thoracic  aorta.  Peribronchial thickening and accentuation perihilar markings.  Mild atelectasis versus infiltrate at medial right lung base.  Remaining lungs grossly clear.  No pleural effusion, pneumothorax or acute osseous findings.  IMPRESSION:  Enlargement of cardiac silhouette with pulmonary vascular  congestion.  Bronchitic changes with mild atelectasis versus infiltrate at  medial right lung base.  Original Report Authenticated By: Ulyses Southward, M.D.    Assessment/Plan  Principal Problem:  *Acute on chronic diastolic congestive heart failure, secondary to afib with RVR Active Problems:  Atrial fibrillation with RVR, symptomatic  DM (diabetes mellitus)  PAF (paroxysmal atrial fibrillation)  HTN (hypertension)  Hyperlipidemia   Plan: Pt was admitted directly from [facility] to [facility], 1 day ago, for A/C diastolic CHF, secondary to a-fib w/ RVR. She is on 25 mg of Lopressor BID. She has been intolerant of cardizem in the past. She remains in a-fib on telemetry. She had a max HR rate in the 170's earlier this am. She is currently in the 110's. She has been asymptomatic. Her nurse reports that her HR increases when she ambulates, then quickly comes back down. Can possibly consider increasing BB for better rate control, as BP allows. Her most recent BP was 124/73. TEE and DCCV also considered for tomorrow. Prior to this admission, she was not on any anticoagulation. She was on Xarelto in the past, but apparently stopped due to sinus drainage w/ blood. She was placed on IV heparin yesterday. She has ruled out for MI w/ negative troponins x 3. Can consider switching from IV heparin to Eliquis. Renal function is normal. Cr today is 1.07. Cr was 0.82 at time of admission, but she has received IV lasix.  With Cr <1.5, age <80 and weight >60 kg, can start regular dose of 5 mg BID. MD to make final decision regarding AC.    In regards to acute HF, she has diuresed 4.49 L since admission yesterday. Lungs sound  clear on exam and no significant LEE or JVD noted. SOB has resolved. Pro BNP is slightly up from 1 day ago at 1,841 (1,190 yesterday). ? The need to switch from IV to PO Lasix.  There was also concern yesterday about low glucose levels. Metformin was held. No hypoglycemia today with glucose of 107. Can continue to monitor. MD to follow with further recommendation.     LOS: 1 day    Brittainy M. Delmer Islam 08/26/2012 10:25 AM    Patient seen and examined. Agree with assessment and plan. No chest pain. Remains  in AF in 90's; will titrate beta-blocker 50 mg bid of metoprolol. For TEE guided cardioversion tomorrow if still in AF. BNP elevated at 1190. Continue diuresis.   Lennette Bihari, MD, [facility] 08/26/2012 11:38 AM

## 2012-08-26 NOTE — Progress Notes (Signed)
ANTICOAGULATION CONSULT NOTE - Follow Up Consult  Pharmacy Consult for heparin Indication: atrial fibrillation  Labs:  Basename 08/25/12 2320 08/25/12 2319 08/25/12 1840 08/25/12 1324 08/25/12 1323  HGB -- -- -- 10.0* --  HCT -- -- -- 31.4* --  PLT -- -- -- 394 --  APTT -- -- -- 31 --  LABPROT -- -- -- 13.6 --  INR -- -- -- 1.05 --  HEPARINUNFRC -- <0.10* -- -- --  CREATININE -- -- -- 0.82 --  CKTOTAL -- -- -- -- --  CKMB -- -- -- -- --  TROPONINI <0.30 -- <0.30 -- <0.30    Assessment: 67yo female undetectable on heparin with initial dosing for Afib; no gtt issues per RN.  Goal of Therapy:  Heparin level 0.3-0.7 units/ml   Plan:  Will rebolus with 2000 units of heparin and increase gtt by 4 units/kg/hr to 1250 units/hr and check level in 6hr.  Colleen Can PharmD BCPS 08/26/2012,1:48 AM

## 2012-08-27 LAB — CBC
HCT: 33.6 % — ABNORMAL LOW (ref 36.0–46.0)
Hemoglobin: 10.9 g/dL — ABNORMAL LOW (ref 12.0–15.0)
MCH: 26.5 pg (ref 26.0–34.0)
MCHC: 32.4 g/dL (ref 30.0–36.0)
MCV: 81.6 fL (ref 78.0–100.0)
Platelets: 447 10*3/uL — ABNORMAL HIGH (ref 150–400)
RBC: 4.12 MIL/uL (ref 3.87–5.11)
RDW: 16.3 % — ABNORMAL HIGH (ref 11.5–15.5)
WBC: 11.3 10*3/uL — ABNORMAL HIGH (ref 4.0–10.5)

## 2012-08-27 LAB — BASIC METABOLIC PANEL
BUN: 23 mg/dL (ref 6–23)
CO2: 28 mEq/L (ref 19–32)
Calcium: 9.8 mg/dL (ref 8.4–10.5)
Chloride: 95 mEq/L — ABNORMAL LOW (ref 96–112)
Creatinine, Ser: 1 mg/dL (ref 0.50–1.10)
GFR calc Af Amer: 66 mL/min — ABNORMAL LOW (ref >90)
GFR calc non Af Amer: 57 mL/min — ABNORMAL LOW (ref >90)
Glucose, Bld: 118 mg/dL — ABNORMAL HIGH (ref 70–99)
Potassium: 4 mEq/L (ref 3.5–5.1)
Sodium: 135 mEq/L (ref 135–145)

## 2012-08-27 LAB — GLUCOSE, CAPILLARY
Glucose-Capillary: 129 mg/dL — ABNORMAL HIGH (ref 70–99)
Glucose-Capillary: 137 mg/dL — ABNORMAL HIGH (ref 70–99)
Glucose-Capillary: 165 mg/dL — ABNORMAL HIGH (ref 70–99)
Glucose-Capillary: 238 mg/dL — ABNORMAL HIGH (ref 70–99)

## 2012-08-27 LAB — HEPARIN LEVEL (UNFRACTIONATED): Heparin Unfractionated: 0.23 IU/mL — ABNORMAL LOW (ref 0.30–0.70)

## 2012-08-27 MED ORDER — APIXABAN 5 MG PO TABS
5.0000 mg | ORAL_TABLET | Freq: Two times a day (BID) | ORAL | Status: DC
  Administered 2012-08-27 – 2012-08-30 (×7): 5 mg via ORAL
  Filled 2012-08-27 (×9): qty 1

## 2012-08-27 MED ORDER — DIGOXIN 250 MCG PO TABS
0.2500 mg | ORAL_TABLET | Freq: Every day | ORAL | Status: DC
  Administered 2012-08-28 – 2012-08-30 (×3): 0.25 mg via ORAL
  Filled 2012-08-27 (×3): qty 1

## 2012-08-27 MED ORDER — DIGOXIN 250 MCG PO TABS
0.5000 mg | ORAL_TABLET | Freq: Once | ORAL | Status: AC
  Administered 2012-08-27: 0.5 mg via ORAL
  Filled 2012-08-27: qty 2

## 2012-08-27 MED ORDER — POTASSIUM CHLORIDE CRYS ER 20 MEQ PO TBCR
20.0000 meq | EXTENDED_RELEASE_TABLET | Freq: Once | ORAL | Status: AC
  Administered 2012-08-27: 20 meq via ORAL
  Filled 2012-08-27: qty 1

## 2012-08-27 MED ORDER — HEPARIN BOLUS VIA INFUSION
1000.0000 [IU] | Freq: Once | INTRAVENOUS | Status: AC
  Administered 2012-08-27: 1000 [IU] via INTRAVENOUS
  Filled 2012-08-27: qty 1000

## 2012-08-27 NOTE — Progress Notes (Signed)
ANTICOAGULATION CONSULT NOTE - Follow UP  Pharmacy Consult for Heparin Indication: atrial fibrillation  Allergies  Allergen Reactions  . Benicar (Olmesartan) Anaphylaxis  . Cardizem (Diltiazem Hcl) Other (See Comments)    Extreme fatique  . Other Itching and Swelling    White meat chicken  To hands.   . Shrimp (Shellfish Allergy) Swelling    Hand swelling   Patient Measurements: Height: 5\' 2"  (157.5 cm) Weight: 194 lb 4.8 oz (88.134 kg) IBW/kg (Calculated) : 50.1  Heparin Dosing Weight: 63 kg  Vital Signs: Temp: 97.9 F (36.6 C) (02/07 0530) Temp src: Oral (02/07 0530) BP: 121/82 mmHg (02/07 0530) Pulse Rate: 94  (02/07 0530)  Labs:  Basename 08/27/12 0525 08/26/12 0904 08/26/12 0504 08/26/12 0500 08/25/12 2320 08/25/12 2319 08/25/12 1840 08/25/12 1324 08/25/12 1323  HGB 10.9* -- -- 10.5* -- -- -- -- --  HCT 33.6* -- -- 32.8* -- -- -- 31.4* --  PLT 447* -- -- 436* -- -- -- 394 --  APTT -- -- -- -- -- -- -- 31 --  LABPROT -- -- -- -- -- -- -- 13.6 --  INR -- -- -- -- -- -- -- 1.05 --  HEPARINUNFRC 0.23* 0.32 -- -- -- <0.10* -- -- --  CREATININE -- -- 1.07 -- -- -- -- 0.82 --  CKTOTAL -- -- -- -- -- -- -- -- --  CKMB -- -- -- -- -- -- -- -- --  TROPONINI -- -- -- -- <0.30 -- <0.30 -- <0.30    Estimated Creatinine Clearance: 51.9 ml/min (by C-G formula based on Cr of 1.07).  Medical History: Past Medical History  Diagnosis Date  . Dysrhythmia   . Diabetes mellitus without complication   . Acute on chronic diastolic congestive heart failure, secondary to afib with RVR 08/25/2012  . Atrial fibrillation with RVR, symptomatic 08/25/2012  . DM (diabetes mellitus) 08/25/2012  . PAF (paroxysmal atrial fibrillation) 08/25/2012  . HTN (hypertension) 08/25/2012  . Hyperlipidemia 08/25/2012  . Depression   . Shortness of breath   . GERD (gastroesophageal reflux disease)    Assessment: 67 yo female admitted with hx. of PAF, chest pain and fatigue.  At her office visit today, she  was found to be in Afib with RVR and acute diastolic HF.  She has not been receiving any anticoagulation.  Heparin therapy started for cardiac work-up.    Heparin level (0.23) is below-goal on 1250 units/hr. No problem with line / infusion and no bleeding per RN.   Goal of Therapy:  Heparin level 0.3-0.7 units/ml Monitor platelets by anticoagulation protocol: Yes   Plan:  1. Heparin IV bolus 1000 units x 1, then increase IV infusion to 1400 units/hr.  2. Heparin level in 6 hours.   Lorre Munroe, PharmD 08/27/2012,6:50 AM

## 2012-08-27 NOTE — Progress Notes (Signed)
[facility]  Subjective: No Complaints. She felt a bit lightheaded last night while ambulating to the restroom. Sensation was relieved with rest. No further episodes/ is asymptomatic.  Objective: Vital signs in last 24 hours: Temp:  [97.2 F (36.2 C)-98.2 F (36.8 C)] 97.9 F (36.6 C) (02/07 0530) Pulse Rate:  [94-113] 94  (02/07 0530) Resp:  [18] 18  (02/06 1400) BP: (113-124)/(73-82) 121/82 mmHg (02/07 0530) SpO2:  [93 %] 93 % (02/07 0530) Weight:  [194 lb 4.8 oz (88.134 kg)] 194 lb 4.8 oz (88.134 kg) (02/07 0530) Last BM Date: 08/25/12  Intake/Output from previous day: 02/06 0701 - 02/07 0700 In: 3 [I.V.:3] Out: 3550 [Urine:3550] Intake/Output this shift:    Medications Current Facility-Administered Medications  Medication Dose Route Frequency Provider Last Rate Last Dose  . 0.9 %  sodium chloride infusion  250 mL Intravenous PRN Nada Boozer, NP      . acetaminophen (TYLENOL) tablet 650 mg  650 mg Oral Q4H PRN Nada Boozer, NP      . ALPRAZolam Prudy Feeler) tablet 0.25 mg  0.25 mg Oral BID PRN Nada Boozer, NP      . amitriptyline (ELAVIL) tablet 25 mg  25 mg Oral QHS Nada Boozer, NP   25 mg at 08/26/12 2225  . aspirin EC tablet 81 mg  81 mg Oral Daily Nada Boozer, NP   81 mg at 08/27/12 0951  . famotidine (PEPCID) tablet 40 mg  40 mg Oral Daily Nada Boozer, NP   40 mg at 08/27/12 0951  . furosemide (LASIX) injection 40 mg  40 mg Intravenous Q12H Nada Boozer, NP   40 mg at 08/26/12 2356  . heparin ADULT infusion 100 units/mL (25000 units/250 mL)  1,400 Units/hr Intravenous Continuous Runell Gess, MD 14 mL/hr at 08/27/12 0728 1,400 Units/hr at 08/27/12 0728  . insulin aspart (novoLOG) injection 0-9 Units  0-9 Units Subcutaneous TID WC Nada Boozer, NP   1 Units at 08/27/12 0914  . insulin aspart protamine-insulin aspart (NOVOLOG 70/30) injection 25 Units  25 Units Subcutaneous BID WC Nada Boozer, NP   25 Units at 08/27/12 0950  .  loratadine (CLARITIN) tablet 10 mg  10 mg Oral Daily Nada Boozer, NP   10 mg at 08/27/12 0951  . metoprolol (LOPRESSOR) tablet 50 mg  50 mg Oral BID Brittainy Simmons, PA-C   50 mg at 08/26/12 2225  . nitroGLYCERIN (NITROSTAT) SL tablet 0.4 mg  0.4 mg Sublingual Q5 Min x 3 PRN Nada Boozer, NP      . ondansetron [facility]) injection 4 mg  4 mg Intravenous Q6H PRN Nada Boozer, NP      . sertraline (ZOLOFT) tablet 100 mg  100 mg Oral Daily Nada Boozer, NP   100 mg at 08/27/12 0951  . simvastatin (ZOCOR) tablet 20 mg  20 mg Oral q1800 Nada Boozer, NP   20 mg at 08/26/12 1719  . sodium chloride 0.9 % injection 3 mL  3 mL Intravenous Q12H Nada Boozer, NP   3 mL at 08/25/12 1357  . sodium chloride 0.9 % injection 3 mL  3 mL Intravenous PRN Nada Boozer, NP   3 mL at 08/26/12 1202  . zolpidem (AMBIEN) tablet 5 mg  5 mg Oral QHS PRN Nada Boozer, NP   5 mg at 08/27/12 0148    PE: General appearance: alert, cooperative and no distress Lungs: clear to auscultation bilaterally Heart: irregularly irregular rhythm Pulses: 2+ and symmetric Skin: no LEE Neurologic:  Grossly normal  Lab Results:   Basename 08/27/12 0525 08/26/12 0500 08/25/12 1324  WBC 11.3* 10.3 9.5  HGB 10.9* 10.5* 10.0*  HCT 33.6* 32.8* 31.4*  PLT 447* 436* 394   BMET  Basename 08/26/12 0504 08/25/12 1324  NA 140 137  K 3.8 4.3  CL 98 100  CO2 29 27  GLUCOSE 107* 233*  BUN 20 15  CREATININE 1.07 0.82  CALCIUM 10.0 9.8   PT/INR  Basename 08/25/12 1324  LABPROT 13.6  INR 1.05   Cholesterol  Basename 08/26/12 0504  CHOL 112   Studies - 2D Echo 2/6 Study Conclusions  - Left ventricle: The cavity size was normal. Wall thickness was normal. Systolic function was mildly reduced. The estimated ejection fraction was in the range of 45% to 50%. Diffuse hypokinesis. Although no diagnostic regional wall motion abnormality was identified, this possibility cannot be completely excluded on the basis of this study. -  Left atrium: The atrium was moderately dilated. - Right atrium: The atrium was mildly dilated. - Atrial septum: No defect or patent foramen ovale was identified. - Pulmonary arteries: PA peak pressure: 36mm Hg (S). Transthoracic echocardiography. M-mode, complete 2D, spectral Doppler, and color Doppler. Height: Height: 157.5cm. Height: 62in. Weight: Weight: 88kg. Weight: 193.6lb. Body mass index: BMI: 35.5kg/m^2. Body surface area: BSA: 1.61m^2. Blood pressure: 124/73. Patient status: Inpatient. Location: Bedside.   Assessment/Plan  Principal Problem:  *Acute on chronic diastolic congestive heart failure, secondary to afib with RVR Active Problems:  Atrial fibrillation with RVR, symptomatic  DM (diabetes mellitus)  PAF (paroxysmal atrial fibrillation)  HTN (hypertension)  Hyperlipidemia  Plan: Pt remains in A-fib on telemetry. Rate is controlled w/ rates currently in the upper 90's -low 100's. Much improved from yesterday. Lopressor was increased yesterday to 50 mg BID. Her BP has remained stable. Most recent BP was 120/60. TEE cardioversion could not be arranged for today due to scheduling. May have to anticoagulate for 4 weeks then have cardioversion done as an OP. Eliquis is being considered for [facility]. Renal function is normal. With Cr <1.5, age <80 and weight >60 kg, can start regular dose of 5 mg BID. MD to make final decision regarding AC. Overall, the patient is stable and asymptomatic. Will need to continue to monitor to ensure that HR is adequately controlled/BP stable. If ok, may be able to consider d/c home with high dose BB for rate control and anticoagulant for stroke prophylaxis. Can also consider starting on an AAD if not fully responsive to rate control. MD to follow with further recommendation.   In regards to HF, she has diuresed well with IV lasix. ProBNP was 1,841 yesterday. Will continue with IV lasix today and will switch to PO tomorrow. Will order BNP for tomorrow.  Nearly no LEE on exam. K was 3.8 yesterday. Will give 20 mEq K-dur today. Cr. yesterday was WNL at 1.07. Will recheck BMP this afternoon. 2D echo yesterday revealed mildly reduced systolic dysfunction w/ an EF of 45-50%. There was diffuse hypokinesis. The left atria was moderately dilated. With reduced systolic function, will add digoxin for a-fib.    LOS: 2 days    Brittainy M. Delmer Islam 08/27/2012 9:54 AM  I have seen and evaluated the patient this AM along with Boyce Medici. I agree with her findings, examination as well as impression recommendations. Acute on Chronic (mostly) Diastolic HF exacerbated by recurrence of Atrial Fibrillation with RVR.  Looks & feels much better.  Rate has improved, but remains in  Afib.  Diuresed very well - lungs clear & edema all but gone. Unable to get TEE scheduled for today -- ? If EF down somewhat due to Afib, or if this is her baseline.   Convert to PO Lasix in AM; replete K, was drifting down yesterday.  Recheck proBNP in AM.  Convert AC to Eliquis (pharmacy to assist with Heparin transition)  Add digoxin for additional rate control given mildly reduced EF -- if unable to adequately rate control with improvement of Sx, would need ot consider converting to Sotalol (amiodarone or dofetilide), but for now will try for rate control & anticoagulation for next 4 wks -- with plan for OP DCCV if still in Afib  BP controlled  Anticipate 2-3 more days in-pt.  Would like to see how she does with rate control & PO Lasix tomorrow.  Marykay Lex, M.D., M.S. [facility] [address] [place], [place]  [address]  [phone] Pager # [phone] 08/27/2012 10:49 AM

## 2012-08-28 ENCOUNTER — Encounter (HOSPITAL_COMMUNITY): Payer: Self-pay | Admitting: Cardiology

## 2012-08-28 DIAGNOSIS — I519 Heart disease, unspecified: Secondary | ICD-10-CM

## 2012-08-28 DIAGNOSIS — I428 Other cardiomyopathies: Secondary | ICD-10-CM | POA: Diagnosis present

## 2012-08-28 HISTORY — DX: Heart disease, unspecified: I51.9

## 2012-08-28 LAB — BASIC METABOLIC PANEL
BUN: 27 mg/dL — ABNORMAL HIGH (ref 6–23)
CO2: 22 mEq/L (ref 19–32)
Calcium: 9.7 mg/dL (ref 8.4–10.5)
Chloride: 96 mEq/L (ref 96–112)
Creatinine, Ser: 0.89 mg/dL (ref 0.50–1.10)
GFR calc Af Amer: 75 mL/min — ABNORMAL LOW (ref >90)
GFR calc non Af Amer: 65 mL/min — ABNORMAL LOW (ref >90)
Glucose, Bld: 288 mg/dL — ABNORMAL HIGH (ref 70–99)
Potassium: 4.4 mEq/L (ref 3.5–5.1)
Sodium: 134 mEq/L — ABNORMAL LOW (ref 135–145)

## 2012-08-28 LAB — CBC
HCT: 34.6 % — ABNORMAL LOW (ref 36.0–46.0)
Hemoglobin: 10.7 g/dL — ABNORMAL LOW (ref 12.0–15.0)
MCH: 25.3 pg — ABNORMAL LOW (ref 26.0–34.0)
MCHC: 30.9 g/dL (ref 30.0–36.0)
MCV: 81.8 fL (ref 78.0–100.0)
Platelets: 440 10*3/uL — ABNORMAL HIGH (ref 150–400)
RBC: 4.23 MIL/uL (ref 3.87–5.11)
RDW: 16.1 % — ABNORMAL HIGH (ref 11.5–15.5)
WBC: 9.6 10*3/uL (ref 4.0–10.5)

## 2012-08-28 LAB — GLUCOSE, CAPILLARY
Glucose-Capillary: 138 mg/dL — ABNORMAL HIGH (ref 70–99)
Glucose-Capillary: 223 mg/dL — ABNORMAL HIGH (ref 70–99)
Glucose-Capillary: 162 mg/dL — ABNORMAL HIGH (ref 70–99)
Glucose-Capillary: 88 mg/dL (ref 70–99)

## 2012-08-28 LAB — PRO B NATRIURETIC PEPTIDE: Pro B Natriuretic peptide (BNP): 1301 pg/mL — ABNORMAL HIGH (ref 0–125)

## 2012-08-28 MED ORDER — FUROSEMIDE 40 MG PO TABS: 40.0000 mg | ORAL_TABLET | Freq: Two times a day (BID) | ORAL | Status: DC

## 2012-08-28 MED ORDER — FUROSEMIDE 40 MG PO TABS
40.0000 mg | ORAL_TABLET | Freq: Two times a day (BID) | ORAL | Status: DC
  Administered 2012-08-28 – 2012-08-30 (×5): 40 mg via ORAL
  Filled 2012-08-28 (×7): qty 1

## 2012-08-28 MED ORDER — SOTALOL HCL 80 MG PO TABS
80.0000 mg | ORAL_TABLET | Freq: Two times a day (BID) | ORAL | Status: DC
  Administered 2012-08-28 – 2012-08-30 (×5): 80 mg via ORAL
  Filled 2012-08-28 (×6): qty 1

## 2012-08-28 MED ORDER — POTASSIUM CHLORIDE CRYS ER 20 MEQ PO TBCR
20.0000 meq | EXTENDED_RELEASE_TABLET | Freq: Two times a day (BID) | ORAL | Status: DC
  Administered 2012-08-28 – 2012-08-30 (×5): 20 meq via ORAL
  Filled 2012-08-28 (×6): qty 1

## 2012-08-28 MED ORDER — METFORMIN HCL 500 MG PO TABS
1000.0000 mg | ORAL_TABLET | Freq: Two times a day (BID) | ORAL | Status: DC
  Administered 2012-08-28 – 2012-08-30 (×4): 1000 mg via ORAL
  Filled 2012-08-28 (×6): qty 2

## 2012-08-28 NOTE — Progress Notes (Signed)
I have seen and evaluated the patient this AM along with Nada Boozer, NP. I agree with her findings, examination as well as impression recommendations.  She symptomatically improved from the A on C HF (diastolic), but feels quite lethargic with the increased does of BB.  HRs still jump up into the 110s-140s with moving around.   Convert to PO Lasix today, follow K+ (she notes some cramping, but levels have been stable)  Will change from Lopressor to Sotalol today @ 80mg  bid -- ECG monitoring daily to follow QTc for ~2 days; prefer to use Sotalol vs. Amiodarone or Dofetilide.  If still in Afib on Monday - will try to schedule TEE-DCCV.  Converted to Eliquis yesterday.  Ok to continue low dose ASA.  Ambulate in hall -- monitoring HR response.  Marykay Lex, M.D., M.S. [facility] [address] [place], [place]  [address]  [phone] Pager # [phone] 08/28/2012 10:35 AM

## 2012-08-28 NOTE — Progress Notes (Signed)
Subjective: overall HR improved now only to 140 with ambulation.   Feels very tired with BB  Objective: Vital signs in last 24 hours: Temp:  [97.5 F (36.4 C)-98 F (36.7 C)] 98 F (36.7 C) (02/08 0500) Pulse Rate:  [84-96] 84 (02/08 0500) Resp:  [18] 18 (02/08 0500) BP: (104-114)/(59-77) 104/59 mmHg (02/08 0500) SpO2:  [93 %-95 %] 93 % (02/08 0500) Weight:  [87.68 kg (193 lb 4.8 oz)] 87.68 kg (193 lb 4.8 oz) (02/08 0500) Weight change: -0.454 kg (-1 lb) Last BM Date: 08/25/12 Intake/Output from previous day: -850  Wt 87.6 down from 92.5 at admit  approx 9.5 pounds 02/07 0701 - 02/08 0700 In: 600 [P.O.:600] Out: 1450 [Urine:1450] Intake/Output this shift: Total I/O In: 240 [P.O.:240] Out: -   PE:  General:alert and oriented, complains of increased fatigue Heart:irreg irreg Lungs:improved breath sounds no rales Abd:+ BS soft non tender Ext:no edema     Lab Results:  Recent Labs  08/27/12 0525 08/28/12 0518  WBC 11.3* 9.6  HGB 10.9* 10.7*  HCT 33.6* 34.6*  PLT 447* 440*   BMET  Recent Labs  08/26/12 0504 08/27/12 1302  NA 140 135  K 3.8 4.0  CL 98 95*  CO2 29 28  GLUCOSE 107* 118*  BUN 20 23  CREATININE 1.07 1.00  CALCIUM 10.0 9.8    Recent Labs  08/25/12 1840 08/25/12 2320  TROPONINI <0.30 <0.30    Lab Results  Component Value Date   CHOL 112 08/26/2012   HDL 34* 08/26/2012   LDLCALC 56 08/26/2012   TRIG 108 08/26/2012   CHOLHDL 3.3 08/26/2012   Lab Results  Component Value Date   HGBA1C 6.3* 08/25/2012     Lab Results  Component Value Date   TSH 0.737 08/25/2012    Hepatic Function Panel  Recent Labs  08/25/12 1324  PROT 7.6  ALBUMIN 3.7  AST 13  ALT 11  ALKPHOS 63  BILITOT 0.3    Recent Labs  08/26/12 0504  CHOL 112     Studies/Results: Echo 08/27/12: Left ventricle: The cavity size was normal. Wall thickness was normal. Systolic function was mildly reduced. The estimated ejection fraction was in the range of 45% to 50%.  Diffuse hypokinesis. Although no diagnostic regional wall motion abnormality was identified, this possibility cannot be completely excluded on the basis of this study. - Left atrium: The atrium was moderately dilated. - Right atrium: The atrium was mildly dilated. - Atrial septum: No defect or patent foramen ovale was identified. - Pulmonary arteries: PA peak pressure: 36mm Hg (S).   Medications: I have reviewed the patient's current medications. Marland Kitchen amitriptyline  25 mg Oral QHS  . apixaban  5 mg Oral BID  . aspirin EC  81 mg Oral Daily  . digoxin  0.25 mg Oral Daily  . famotidine  40 mg Oral Daily  . furosemide  40 mg Intravenous Q12H  . insulin aspart  0-9 Units Subcutaneous TID WC  . insulin aspart protamine-insulin aspart  25 Units Subcutaneous BID WC  . loratadine  10 mg Oral Daily  . metoprolol tartrate  50 mg Oral BID  . sertraline  100 mg Oral Daily  . simvastatin  20 mg Oral q1800  . sodium chloride  3 mL Intravenous Q12H   Assessment/Plan: Principal Problem:   Acute on chronic diastolic congestive heart failure, secondary to afib with RVR Active Problems:   Atrial fibrillation with RVR, symptomatic   DM (diabetes mellitus)   PAF (  paroxysmal atrial fibrillation)   HTN (hypertension)   Hyperlipidemia   LV dysfunction, EF 45-50% due to a. fib per echo 08/27/12   PLAN: Improving control of VR with AFib.  Pro BNP today 1301 down form high of 1841 at peak.  Wt down 9.5 pounds.  On eliquis for anticoagulation.  ? Stop asa? Lasix at 40 mg IV BID, continue vs. Change to po 40 mg daily? EF down somewhat possible related to A fib with RVR -Will monitor lytes with addition of betapace. , daily EKG Stop BB.  See Dr. Elissa Hefty note.  If continues in A fib possible TEE DCCV on Monday?    LOS: 3 days   INGOLD,LAURA R 08/28/2012, 10:02 AM

## 2012-08-29 LAB — BASIC METABOLIC PANEL
BUN: 31 mg/dL — ABNORMAL HIGH (ref 6–23)
CO2: 30 mEq/L (ref 19–32)
Calcium: 10.2 mg/dL (ref 8.4–10.5)
Chloride: 94 mEq/L — ABNORMAL LOW (ref 96–112)
Creatinine, Ser: 1.07 mg/dL (ref 0.50–1.10)
GFR calc Af Amer: 60 mL/min — ABNORMAL LOW (ref >90)
GFR calc non Af Amer: 52 mL/min — ABNORMAL LOW (ref >90)
Glucose, Bld: 127 mg/dL — ABNORMAL HIGH (ref 70–99)
Potassium: 4.8 mEq/L (ref 3.5–5.1)
Sodium: 135 mEq/L (ref 135–145)

## 2012-08-29 LAB — CBC
HCT: 38.7 % (ref 36.0–46.0)
Hemoglobin: 12.6 g/dL (ref 12.0–15.0)
MCH: 26.5 pg (ref 26.0–34.0)
MCHC: 32.6 g/dL (ref 30.0–36.0)
MCV: 81.5 fL (ref 78.0–100.0)
Platelets: 516 10*3/uL — ABNORMAL HIGH (ref 150–400)
RBC: 4.75 MIL/uL (ref 3.87–5.11)
RDW: 15.8 % — ABNORMAL HIGH (ref 11.5–15.5)
WBC: 12.6 10*3/uL — ABNORMAL HIGH (ref 4.0–10.5)

## 2012-08-29 LAB — PRO B NATRIURETIC PEPTIDE: Pro B Natriuretic peptide (BNP): 932.8 pg/mL — ABNORMAL HIGH (ref 0–125)

## 2012-08-29 LAB — GLUCOSE, CAPILLARY
Glucose-Capillary: 186 mg/dL — ABNORMAL HIGH (ref 70–99)
Glucose-Capillary: 103 mg/dL — ABNORMAL HIGH (ref 70–99)
Glucose-Capillary: 143 mg/dL — ABNORMAL HIGH (ref 70–99)
Glucose-Capillary: 112 mg/dL — ABNORMAL HIGH (ref 70–99)

## 2012-08-29 MED ORDER — SODIUM CHLORIDE 0.9 % IV SOLN: INTRAVENOUS | Status: DC

## 2012-08-29 NOTE — Progress Notes (Signed)
[facility]  [name], female   DOB: [birth year], 67 y.o.   MRN: [id number] YOWF with hx of PAF and prior anticoagulation with Xarelto, but stopped due to sinus drainage with blood. She was also intolerant to Cardizem in the past with severe fatigue. (though no problems with norvasc) She presented to the office today with 1 week of increasing SOB, orthopnea, DOE. She has to rest after eating she is so fatigued. She has been tired for about 2 months but the SOB is in the last week. Found to be in Afib with RVR. Has been anticoagulated -- Heparin converted to Eliquis.  Acute on Chronic (mostly diastolic) HF - dyspnea is much improved with diuresis. Initial attempts @ rate control with increasing BB lead to extreme fatigue & lethargy, but still had rates up to 140s --> converted to Sotalol yesterday.  Subjective: Feels better this AM -- just did not sleep well.  Less lethargic.  Breathing well.    Objective: Vital signs in last 24 hours: Temp:  [97.8 F (36.6 C)-98.1 F (36.7 C)] 97.8 F (36.6 C) (02/09 0500) Pulse Rate:  [72-91] 72 (02/09 0500) Resp:  [18] 18 (02/09 0500) BP: (120-140)/(69-83) 140/69 mmHg (02/09 0500) SpO2:  [92 %-95 %] 92 % (02/09 0500) Weight:  [87.68 kg (193 lb 4.8 oz)] 87.68 kg (193 lb 4.8 oz) (02/09 0500) Last BM Date: 08/28/12  Intake/Output from previous day: 02/08 0701 - 02/09 0700 In: 363 [P.O.:360; I.V.:3] Out: 2300 [Urine:2300] Intake/Output this shift:    Medications Current Facility-Administered Medications  Medication Dose Route Frequency Provider Last Rate Last Dose  . 0.9 %  sodium chloride infusion  250 mL Intravenous PRN Nada Boozer, NP      . acetaminophen (TYLENOL) tablet 650 mg  650 mg Oral Q4H PRN Nada Boozer, NP      . ALPRAZolam Prudy Feeler) tablet 0.25 mg  0.25 mg Oral BID PRN Nada Boozer, NP      . amitriptyline (ELAVIL) tablet 25 mg  25 mg Oral QHS Nada Boozer, NP   25 mg at 08/28/12 2258  .  apixaban (ELIQUIS) tablet 5 mg  5 mg Oral BID Brittainy Simmons, PA-C   5 mg at 08/28/12 2258  . aspirin EC tablet 81 mg  81 mg Oral Daily Nada Boozer, NP   81 mg at 08/28/12 1053  . digoxin (LANOXIN) tablet 0.25 mg  0.25 mg Oral Daily Brittainy Simmons, PA-C   0.25 mg at 08/28/12 1053  . famotidine (PEPCID) tablet 40 mg  40 mg Oral Daily Nada Boozer, NP   40 mg at 08/28/12 1052  . furosemide (LASIX) tablet 40 mg  40 mg Oral BID Nada Boozer, NP   40 mg at 08/28/12 1814  . insulin aspart (novoLOG) injection 0-9 Units  0-9 Units Subcutaneous TID WC Nada Boozer, NP   2 Units at 08/28/12 1815  . insulin aspart protamine-insulin aspart (NOVOLOG 70/30) injection 25 Units  25 Units Subcutaneous BID WC Nada Boozer, NP   25 Units at 08/28/12 1814  . loratadine (CLARITIN) tablet 10 mg  10 mg Oral Daily Nada Boozer, NP   10 mg at 08/28/12 1052  . metFORMIN (GLUCOPHAGE) tablet 1,000 mg  1,000 mg Oral BID WC Nada Boozer, NP   1,000 mg at 08/28/12 1814  . nitroGLYCERIN (NITROSTAT) SL tablet 0.4 mg  0.4 mg Sublingual Q5 Min x 3 PRN Nada Boozer, NP      . ondansetron [facility]) injection  4 mg  4 mg Intravenous Q6H PRN Nada Boozer, NP      . potassium chloride SA (K-DUR,KLOR-CON) CR tablet 20 mEq  20 mEq Oral BID Nada Boozer, NP   20 mEq at 08/28/12 2257  . sertraline (ZOLOFT) tablet 100 mg  100 mg Oral Daily Nada Boozer, NP   100 mg at 08/28/12 1052  . simvastatin (ZOCOR) tablet 20 mg  20 mg Oral q1800 Nada Boozer, NP   20 mg at 08/28/12 1814  . sodium chloride 0.9 % injection 3 mL  3 mL Intravenous Q12H Nada Boozer, NP   3 mL at 08/28/12 2259  . sodium chloride 0.9 % injection 3 mL  3 mL Intravenous PRN Nada Boozer, NP   3 mL at 08/26/12 1202  . sotalol (BETAPACE) tablet 80 mg  80 mg Oral Q12H Nada Boozer, NP   80 mg at 08/28/12 2258  . zolpidem (AMBIEN) tablet 5 mg  5 mg Oral QHS PRN Nada Boozer, NP   5 mg at 08/27/12 0148   PE: General appearance: alert, cooperative, appears stated age and no  distress Neck: no adenopathy, no carotid bruit and no JVD Lungs: clear to auscultation bilaterally, normal percussion bilaterally and non-labored Heart: regular rate and rhythm, no S3 or S4, no rub and no Murmur or gallop Abdomen: soft, non-tender; bowel sounds normal; no masses,  no organomegaly and obese Extremities: extremities normal, atraumatic, no cyanosis or edema Pulses: 2+ and symmetric Neurologic: Grossly normal  Lab Results:   Recent Labs  08/27/12 0525 08/28/12 0518 08/29/12 0535  WBC 11.3* 9.6 12.6*  HGB 10.9* 10.7* 12.6  HCT 33.6* 34.6* 38.7  PLT 447* 440* 516*   BMET  Recent Labs  08/27/12 1302 08/28/12 1045 08/29/12 0535  NA 135 134* 135  K 4.0 4.4 4.8  CL 95* 96 94*  CO2 28 22 30   GLUCOSE 118* 288* 127*  BUN 23 27* 31*  CREATININE 1.00 0.89 1.07  CALCIUM 9.8 9.7 10.2   Studies - 2D Echo 2/6 -- EF 45-50% (difficult to assess with Afib & unable to assess Diastolic Function).  Assessment/Plan  Principal Problem:   Acute on chronic diastolic congestive heart failure, secondary to afib with RVR Active Problems:   Atrial fibrillation with RVR, symptomatic   DM (diabetes mellitus)   PAF (paroxysmal atrial fibrillation)   HTN (hypertension)   Hyperlipidemia   LV dysfunction, EF 45-50% due to a. fib per echo 08/27/12    LOS: 4 days   Pt remains in A-fib on telemetry. Rate is controlled w/ rates currently in the upper 70's -90's. Much improved from yesterday.  Continues to diurese on PO Lasix with stable renal Fx.   Plan is TEE DCCV in AM if still in Afib.  She symptomatically improved from the A on C HF (diastolic), but feels quite lethargic with the increased does of BB. HRs still jump up into the 110s-140s with moving around.  Convert to PO Lasix today, follow K+ (she notes some cramping, but levels have been stable)  Will change from Lopressor to Sotalol today @ 80mg  bid -- ECG monitoring daily to follow QTc for ~2 days; prefer to use Sotalol vs.  Amiodarone or Dofetilide.  If still in Afib on Monday - will try to schedule TEE-DCCV.  Converted to Eliquis yesterday. Ok to continue low dose ASA. Ambulate in hall -- monitoring HR response.  Anticipate 1-2 more days in-pt.  Would like to see how she does with rate control &  PO Lasix tomorrow.  Marykay Lex, M.D., M.S. [facility] [address] [place], [place]  [address]  [phone] Pager # [phone] 08/29/2012 8:51 AM

## 2012-08-30 LAB — GLUCOSE, CAPILLARY
Glucose-Capillary: 148 mg/dL — ABNORMAL HIGH (ref 70–99)
Glucose-Capillary: 137 mg/dL — ABNORMAL HIGH (ref 70–99)

## 2012-08-30 LAB — CBC
HCT: 38.8 % (ref 36.0–46.0)
Hemoglobin: 12.3 g/dL (ref 12.0–15.0)
MCH: 25.6 pg — ABNORMAL LOW (ref 26.0–34.0)
MCHC: 31.7 g/dL (ref 30.0–36.0)
MCV: 80.7 fL (ref 78.0–100.0)
Platelets: 490 10*3/uL — ABNORMAL HIGH (ref 150–400)
RBC: 4.81 MIL/uL (ref 3.87–5.11)
RDW: 15.6 % — ABNORMAL HIGH (ref 11.5–15.5)
WBC: 11.8 10*3/uL — ABNORMAL HIGH (ref 4.0–10.5)

## 2012-08-30 LAB — BASIC METABOLIC PANEL
BUN: 36 mg/dL — ABNORMAL HIGH (ref 6–23)
CO2: 28 mEq/L (ref 19–32)
Calcium: 10.2 mg/dL (ref 8.4–10.5)
Chloride: 97 mEq/L (ref 96–112)
Creatinine, Ser: 1.13 mg/dL — ABNORMAL HIGH (ref 0.50–1.10)
GFR calc Af Amer: 57 mL/min — ABNORMAL LOW (ref >90)
GFR calc non Af Amer: 49 mL/min — ABNORMAL LOW (ref >90)
Glucose, Bld: 147 mg/dL — ABNORMAL HIGH (ref 70–99)
Potassium: 4.6 mEq/L (ref 3.5–5.1)
Sodium: 137 mEq/L (ref 135–145)

## 2012-08-30 MED ORDER — APIXABAN 5 MG PO TABS: 5.0000 mg | ORAL_TABLET | Freq: Two times a day (BID) | ORAL | Status: DC

## 2012-08-30 MED ORDER — DIGOXIN 250 MCG PO TABS: 0.2500 mg | ORAL_TABLET | Freq: Every day | ORAL | Status: DC

## 2012-08-30 MED ORDER — FUROSEMIDE 40 MG PO TABS: 40.0000 mg | ORAL_TABLET | Freq: Every day | ORAL | Status: DC

## 2012-08-30 MED ORDER — POTASSIUM CHLORIDE CRYS ER 20 MEQ PO TBCR: 20.0000 meq | EXTENDED_RELEASE_TABLET | Freq: Every day | ORAL | Status: DC

## 2012-08-30 MED ORDER — SOTALOL HCL 80 MG PO TABS: 80.0000 mg | ORAL_TABLET | Freq: Two times a day (BID) | ORAL | Status: DC

## 2012-08-30 NOTE — Discharge Summary (Signed)
Physician Discharge Summary  [name] MRN: [id number] DOB/AGE: 01/04/1946 68 y.o.  Admit date: 08/25/2012 Discharge date: 08/31/2012  Admission Diagnoses:  Acute on chronic diastolic HF secondary to Afib RVR  Discharge Diagnoses:  Principal Problem:   Acute on chronic diastolic congestive heart failure, secondary to afib with RVR Active Problems:   Atrial fibrillation with RVR, symptomatic   DM (diabetes mellitus)   PAF (paroxysmal atrial fibrillation)   HTN (hypertension)   Hyperlipidemia   LV dysfunction, EF 45-50% due to a. fib per echo 08/27/12    Discharged Condition: stable  Hospital Course:   71 YOWF with hx of PAF and prior anticoagulation with Xarelto, but stopped due to sinus drainage with blood. She was also intolerant to Cardizem in the past with severe fatigue. (though no problems with norvasc) She presented to the office with 1 week of increasing SOB, orthopnea, DOE. She has to rest after eating she was so fatigued. She has been tired for about 2 months but the SOB is in the last week.  In the office she is found to be in atrial fib with RVR and acute diastolic HF. She is on no anticoagulation.   She was unaware of the atrial fib. She also had chest pain, though she believed this to be indigestion and it does improve with pepcid.   In April of 2013 she had an episode of atrial fib.  2 D echo showed EF > 55% with suggestion of pseudonormalization, moderately dilated LA, mild MR. Lexiscan myoview at that time was normal.   She was admitted for rate control and started on IV heparin then later placed on Eliquis.  Lopressor was added along with 40mg  of IV lasix bid.  Lopressor was titrated to 50mg  Bid was ultimately changed to Sotalol.  Hr had been getting as high as 170 with ambulation but came under control with the sotalol.  QTc prior to starting Betapace was and currently .  She was changed to PO diuretic and ultimately had net fluids of -12L, however, it  appears complete intake may not have been recorded.  TEE/DCCV will be scheduled as an outpatient procedure.  She was seen by Dr. Tresa Endo who felt she was stable for DC home.  DC weight: ~193 pounds.  Consults: None  Significant Diagnostic Studies:   CBC    Component Value Date/Time   WBC 11.8* 08/30/2012 0614   RBC 4.81 08/30/2012 0614   HGB 12.3 08/30/2012 0614   HCT 38.8 08/30/2012 0614   PLT 490* 08/30/2012 0614   MCV 80.7 08/30/2012 0614   MCH 25.6* 08/30/2012 0614   MCHC 31.7 08/30/2012 0614   RDW 15.6* 08/30/2012 0614   LYMPHSABS 1.4 08/25/2012 1324   MONOABS 0.5 08/25/2012 1324   EOSABS 0.2 08/25/2012 1324   BASOSABS 0.0 08/25/2012 1324    BMET    Component Value Date/Time   NA 137 08/30/2012 0614   K 4.6 08/30/2012 0614   CL 97 08/30/2012 0614   CO2 28 08/30/2012 0614   GLUCOSE 147* 08/30/2012 0614   BUN 36* 08/30/2012 0614   CREATININE 1.13* 08/30/2012 0614   CALCIUM 10.2 08/30/2012 0614   GFRNONAA 49* 08/30/2012 0614   GFRAA 57* 08/30/2012 0614   PORTABLE CHEST - 1 VIEW  Comparison: Portable exam 1259 hours without priors for comparison  Findings:  Enlargement of cardiac silhouette with pulmonary vascular  congestion.  Mildly tortuous thoracic aorta.  Peribronchial thickening and accentuation perihilar markings.  Mild atelectasis  versus infiltrate at medial right lung base.  Remaining lungs grossly clear.  No pleural effusion, pneumothorax or acute osseous findings.  IMPRESSION:  Enlargement of cardiac silhouette with pulmonary vascular  congestion.  Bronchitic changes with mild atelectasis versus infiltrate at  medial right lung base.    Treatments: Elliquis, Sotalol, Lasix  Discharge Exam: Blood pressure 125/76, pulse 85, temperature 97.9 F (36.6 C), temperature source Oral, resp. rate 16, height 5\' 2"  (1.575 m), weight 87.771 kg (193 lb 8 oz), SpO2 96.00%.   Disposition: 01-Home or Self Care      Discharge Orders   Future Orders Complete By Expires     Diet -  low sodium heart healthy  As directed     Increase activity slowly  As directed         Medication List    STOP taking these medications       amLODipine 5 MG tablet  Commonly known as:  NORVASC      TAKE these medications       amitriptyline 25 MG tablet  Commonly known as:  ELAVIL  Take 25 mg by mouth at bedtime.     apixaban 5 MG Tabs tablet  Commonly known as:  ELIQUIS  Take 1 tablet (5 mg total) by mouth 2 (two) times daily.     aspirin EC 81 MG tablet  Take 81 mg by mouth daily.     cetirizine 10 MG tablet  Commonly known as:  ZYRTEC  Take 10 mg by mouth daily.     digoxin 0.25 MG tablet  Commonly known as:  LANOXIN  Take 1 tablet (0.25 mg total) by mouth daily.     famotidine 20 MG tablet  Commonly known as:  PEPCID  Take 20 mg by mouth 2 (two) times daily.     furosemide 40 MG tablet  Commonly known as:  LASIX  Take 1 tablet (40 mg total) by mouth daily.     insulin aspart protamine-insulin aspart (70-30) 100 UNIT/ML injection  Commonly known as:  NOVOLOG 70/30  Inject 25-50 Units into the skin 2 (two) times daily. 50 units in the morning  25 units at night     metFORMIN 1000 MG tablet  Commonly known as:  GLUCOPHAGE  Take 1,000 mg by mouth 2 (two) times daily with a meal.     potassium chloride SA 20 MEQ tablet  Commonly known as:  K-DUR,KLOR-CON  Take 1 tablet (20 mEq total) by mouth daily.     sertraline 100 MG tablet  Commonly known as:  ZOLOFT  Take 100 mg by mouth daily.     simvastatin 20 MG tablet  Commonly known as:  ZOCOR  Take 20 mg by mouth daily.     sotalol 80 MG tablet  Commonly known as:  BETAPACE  Take 1 tablet (80 mg total) by mouth every 12 (twelve) hours.         SignedWilburt Finlay 08/31/2012, 7:46 AM

## 2012-08-30 NOTE — Progress Notes (Signed)
[facility]  Subjective: No SOB, CP, dizziness  Objective: Vital signs in last 24 hours: Temp:  [97.7 F (36.5 C)-98 F (36.7 C)] 97.7 F (36.5 C) (02/10 0500) Pulse Rate:  [75-92] 85 (02/10 0940) Resp:  [16-17] 16 (02/10 0500) BP: (107-149)/(67-80) 149/80 mmHg (02/10 0940) SpO2:  [93 %-97 %] 95 % (02/10 0500) Weight:  [87.771 kg (193 lb 8 oz)] 87.771 kg (193 lb 8 oz) (02/10 0500) Last BM Date: 08/29/12  Intake/Output from previous day: 02/09 0701 - 02/10 0700 In: 3 [I.V.:3] Out: 1800 [Urine:1800] Intake/Output this shift: Total I/O In: 360 [P.O.:360] Out: -   Medications Current Facility-Administered Medications  Medication Dose Route Frequency Provider Last Rate Last Dose  . 0.9 %  sodium chloride infusion  250 mL Intravenous PRN Nada Boozer, NP      . 0.9 %  sodium chloride infusion   Intravenous Continuous Nada Boozer, NP      . acetaminophen (TYLENOL) tablet 650 mg  650 mg Oral Q4H PRN Nada Boozer, NP      . ALPRAZolam Prudy Feeler) tablet 0.25 mg  0.25 mg Oral BID PRN Nada Boozer, NP      . amitriptyline (ELAVIL) tablet 25 mg  25 mg Oral QHS Nada Boozer, NP   25 mg at 08/29/12 2233  . apixaban (ELIQUIS) tablet 5 mg  5 mg Oral BID Brittainy Simmons, PA-C   5 mg at 08/30/12 0940  . aspirin EC tablet 81 mg  81 mg Oral Daily Nada Boozer, NP   81 mg at 08/30/12 0940  . digoxin (LANOXIN) tablet 0.25 mg  0.25 mg Oral Daily Brittainy Simmons, PA-C   0.25 mg at 08/30/12 0940  . famotidine (PEPCID) tablet 40 mg  40 mg Oral Daily Nada Boozer, NP   40 mg at 08/30/12 0940  . furosemide (LASIX) tablet 40 mg  40 mg Oral BID Nada Boozer, NP   40 mg at 08/30/12 1610  . insulin aspart (novoLOG) injection 0-9 Units  0-9 Units Subcutaneous TID WC Nada Boozer, NP   1 Units at 08/30/12 [id number]  . insulin aspart protamine-insulin aspart (NOVOLOG 70/30) injection 25 Units  25 Units Subcutaneous BID WC Nada Boozer, NP   25 Units at 08/30/12 0800  . loratadine  (CLARITIN) tablet 10 mg  10 mg Oral Daily Nada Boozer, NP   10 mg at 08/30/12 0940  . metFORMIN (GLUCOPHAGE) tablet 1,000 mg  1,000 mg Oral BID WC Nada Boozer, NP   1,000 mg at 08/30/12 0800  . nitroGLYCERIN (NITROSTAT) SL tablet 0.4 mg  0.4 mg Sublingual Q5 Min x 3 PRN Nada Boozer, NP      . ondansetron [facility]) injection 4 mg  4 mg Intravenous Q6H PRN Nada Boozer, NP      . potassium chloride SA (K-DUR,KLOR-CON) CR tablet 20 mEq  20 mEq Oral BID Nada Boozer, NP   20 mEq at 08/30/12 0940  . sertraline (ZOLOFT) tablet 100 mg  100 mg Oral Daily Nada Boozer, NP   100 mg at 08/30/12 0940  . simvastatin (ZOCOR) tablet 20 mg  20 mg Oral q1800 Nada Boozer, NP   20 mg at 08/29/12 1802  . sodium chloride 0.9 % injection 3 mL  3 mL Intravenous Q12H Nada Boozer, NP   3 mL at 08/30/12 1000  . sodium chloride 0.9 % injection 3 mL  3 mL Intravenous PRN Nada Boozer, NP   3 mL at 08/26/12 1202  . sotalol (BETAPACE) tablet 80  mg  80 mg Oral Q12H Nada Boozer, NP   80 mg at 08/30/12 0940  . zolpidem (AMBIEN) tablet 5 mg  5 mg Oral QHS PRN Nada Boozer, NP   5 mg at 08/29/12 2235    PE: General appearance: alert, cooperative and no distress Lungs: clear to auscultation bilaterally Heart: irregularly irregular rhythm and No MM Extremities: No LEE Pulses: 2+ and symmetric Skin: Warm and Dry Neurologic: Grossly normal  Lab Results:   Recent Labs  08/28/12 0518 08/29/12 0535 08/30/12 0614  WBC 9.6 12.6* 11.8*  HGB 10.7* 12.6 12.3  HCT 34.6* 38.7 38.8  PLT 440* 516* 490*   BMET  Recent Labs  08/28/12 1045 08/29/12 0535 08/30/12 0614  NA 134* 135 137  K 4.4 4.8 4.6  CL 96 94* 97  CO2 22 30 28   GLUCOSE 288* 127* 147*  BUN 27* 31* 36*  CREATININE 0.89 1.07 1.13*  CALCIUM 9.7 10.2 10.2    Assessment/Plan  Principal Problem:   Acute on chronic diastolic congestive heart failure, secondary to afib with RVR Active Problems:   Atrial fibrillation with RVR, symptomatic   DM  (diabetes mellitus)   PAF (paroxysmal atrial fibrillation)   HTN (hypertension)   Hyperlipidemia   LV dysfunction, EF 45-50% due to a. fib per echo 08/27/12   Plan:  Sotalol added and rate is well controlled.  On Elliquis.  BP stable.  Can arrange OP TEE/cardioversion.  Unable to do today.   LOS: 5 days    HAGER, BRYAN 08/30/2012 10:45 AM   Patient seen and examined. Agree with assessment and plan. Seems to be tolerating sotolol initiation.  Will check ECG today and follow QTc interval. Probably can be dc/d later today with plans for outpatient TEE guided cardioversion since schedules precludes this today versus delayed cardioversion after several weeks of eloquis anticoagulation.   Lennette Bihari, MD, [facility] 08/30/2012 11:33 AM

## 2012-08-30 NOTE — Progress Notes (Signed)
Pt discharged to home per MD order. Pt received and reviewed all discharge instructions and medication information including follow-up appointments and prescriptions. Pt also reviewed heart failure education materials.  Pt verbalized understanding.  Pt alert and oriented at discharge with no complaints of pain.  Pt escorted to private vehicle via wheelchair by nurse tech. Efraim Kaufmann

## 2012-08-30 NOTE — Progress Notes (Signed)
Patient evaluated for long-term disease management services with [facility] Care Management Program. Spoke with patient at bedside to explain [facility] Care Management services. Although appreciative of visit, patient reports she does not have any needs at this time. Agreeable to post transition of care call however. Telephone number confirmed. Left contact information and [facility] Care Management brochure with patient. Patient will receive a post discharge transition of care call.  Raiford Noble, MSN-Ed, RN,BSN, [facility], [phone]

## 2012-08-30 NOTE — Care Management Note (Signed)
    Page 1 of 1   08/30/2012     12:25:28 PM   CARE MANAGEMENT NOTE 08/30/2012  Patient:  Alison Bell, Alison Bell   Account Number:  [id number]  Date Initiated:  08/30/2012  Documentation initiated by:  GRAVES-BIGELOW,Jaliyah Fotheringham  Subjective/Objective Assessment:   Pt admitted with SOB and afib. Possible d/c today and f/u with outpatient TEE guided cardioversion.     Action/Plan:   No needs from CM at this time.   Anticipated DC Date:  08/30/2012   Anticipated DC Plan:  HOME/SELF CARE      DC Planning Services  CM consult      Choice offered to / List presented to:             Status of service:  Completed, signed off Medicare Important Message given?   (If response is "NO", the following Medicare IM given date fields will be blank) Date Medicare IM given:   Date Additional Medicare IM given:    Discharge Disposition:  HOME/SELF CARE  Per UR Regulation:  Reviewed for med. necessity/level of care/duration of stay  If discussed at Long Length of Stay Meetings, dates discussed:    Comments:

## 2012-08-31 ENCOUNTER — Encounter: Payer: Self-pay | Admitting: *Deleted

## 2012-09-05 MED ORDER — SODIUM CHLORIDE 0.9 % IV SOLN
INTRAVENOUS | Status: DC
  Administered 2012-09-06: 11:00:00 via INTRAVENOUS

## 2012-09-06 ENCOUNTER — Ambulatory Visit (HOSPITAL_COMMUNITY)
Admission: RE | Admit: 2012-09-06 | Discharge: 2012-09-06 | Disposition: A | Discharge status: Home / Self Care | Payer: Medicare Other | Source: Ambulatory Visit | Attending: Internal Medicine | Admitting: Internal Medicine

## 2012-09-06 ENCOUNTER — Ambulatory Visit (HOSPITAL_COMMUNITY): Payer: Medicare Other | Admitting: *Deleted

## 2012-09-06 ENCOUNTER — Encounter (HOSPITAL_COMMUNITY): Payer: Self-pay | Admitting: *Deleted

## 2012-09-06 ENCOUNTER — Encounter (HOSPITAL_COMMUNITY)
Admission: RE | Disposition: A | Discharge status: Home / Self Care | Payer: Self-pay | Source: Ambulatory Visit | Attending: Internal Medicine

## 2012-09-06 DIAGNOSIS — I4891 Unspecified atrial fibrillation: Secondary | ICD-10-CM | POA: Insufficient documentation

## 2012-09-06 DIAGNOSIS — I059 Rheumatic mitral valve disease, unspecified: Secondary | ICD-10-CM | POA: Insufficient documentation

## 2012-09-06 HISTORY — PX: TEE WITHOUT CARDIOVERSION: SHX5443

## 2012-09-06 HISTORY — PX: CARDIOVERSION: SHX1299

## 2012-09-06 LAB — GLUCOSE, CAPILLARY: Glucose-Capillary: 101 mg/dL — ABNORMAL HIGH (ref 70–99)

## 2012-09-06 SURGERY — ECHOCARDIOGRAM, TRANSESOPHAGEAL
Anesthesia: Monitor Anesthesia Care

## 2012-09-06 MED ORDER — LIDOCAINE VISCOUS 2 % MT SOLN
OROMUCOSAL | Status: AC
  Filled 2012-09-06: qty 15

## 2012-09-06 MED ORDER — PROPOFOL 10 MG/ML IV BOLUS
INTRAVENOUS | Status: DC | PRN
  Administered 2012-09-06: 50 mg via INTRAVENOUS

## 2012-09-06 MED ORDER — MIDAZOLAM HCL 10 MG/2ML IJ SOLN
INTRAMUSCULAR | Status: DC | PRN
  Administered 2012-09-06 (×3): 1 mg via INTRAVENOUS

## 2012-09-06 MED ORDER — SODIUM CHLORIDE 0.9 % IV SOLN
INTRAVENOUS | Status: DC
  Administered 2012-09-06: 10:00:00 via INTRAVENOUS

## 2012-09-06 MED ORDER — FENTANYL CITRATE 0.05 MG/ML IJ SOLN
INTRAMUSCULAR | Status: DC | PRN
  Administered 2012-09-06 (×2): 25 ug via INTRAVENOUS

## 2012-09-06 MED ORDER — MIDAZOLAM HCL 5 MG/ML IJ SOLN
INTRAMUSCULAR | Status: AC
  Filled 2012-09-06: qty 2

## 2012-09-06 MED ORDER — FENTANYL CITRATE 0.05 MG/ML IJ SOLN
INTRAMUSCULAR | Status: AC
  Filled 2012-09-06: qty 2

## 2012-09-06 MED ORDER — BUTAMBEN-TETRACAINE-BENZOCAINE 2-2-14 % EX AERO
INHALATION_SPRAY | CUTANEOUS | Status: DC | PRN
  Administered 2012-09-06: 2 via TOPICAL

## 2012-09-06 MED ORDER — LIDOCAINE VISCOUS 2 % MT SOLN
OROMUCOSAL | Status: DC | PRN
  Administered 2012-09-06: 10 mL via OROMUCOSAL

## 2012-09-06 NOTE — H&P (Signed)
     [facility]          INTERVAL PROCEDURE H&P   History and Physical Interval Note:  09/06/2012 10:03 AM  Alison Bell has presented today for their planned procedure. The various methods of treatment have been discussed with the patient and family. After consideration of risks, benefits and other options for treatment, the patient has consented to the procedure.  The patients' outpatient history has been reviewed, patient examined, and no change in status from most recent office note within the past 30 days. I have reviewed the patients' chart and labs and will proceed as planned. Questions were answered to the patient's satisfaction.   Alison Nose, MD, Alison Bell Attending Cardiologist [facility]  Alison Bell C 09/06/2012, 10:03 AM

## 2012-09-06 NOTE — CV Procedure (Signed)
[facility]  TEE/CARDIOVERSION NOTE   TRANSESOPHAGEAL ECHOCARDIOGRAM (TEE):  Indictation: Atrial Fibrillation  Consent:   Informed consent was obtained prior to the procedure. The risks, benefits and alternatives for the procedure were discussed and the patient comprehended these risks.  Risks include, but are not limited to, cough, sore throat, vomiting, nausea, somnolence, esophageal and stomach trauma or perforation, bleeding, low blood pressure, aspiration, pneumonia, infection, trauma to the teeth and death.    Time Out: Verified patient identification, verified procedure, site/side was marked, verified correct patient position, special equipment/implants available, medications/allergies/relevent history reviewed, required imaging and test results available. Performed  Procedure:  After a procedural time-out, the patient was given 3 mg versed and 50 mcg fentanyl for moderate sedation.  The oropharynx was anesthetized 10 cc of topical 1% viscous lidocaine and 1 cetacaine spray.  The transesophageal probe was inserted in the esophagus and stomach without difficulty and multiple views were obtained.  The patient was kept under observation until the patient left the procedure room.  The patient left the procedure room in stable condition.   Agitated microbubble saline contrast was not administered.  Complications:    Complications: None Patient did tolerate procedure well.  Findings:  1. LEFT VENTRICLE: The left ventricular wall thickness is moderately hypertrophied.  The left ventricular cavity is normal in size. Wall motion is low normal.  LVEF is 50-55%.  2. RIGHT VENTRICLE:  The right ventricle is normal in structure and function without any thrombus or masses.    3. LEFT ATRIUM:  The left atrium is dilated in size without any thrombus or masses.  There is spontaneous echo contrast ("smoke") in the left atrium consistent with a low flow  state.  4. LEFT ATRIAL APPENDAGE:  The left atrial appendage is free of any thrombus or masses. The appendage has single lobes. Pulse doppler indicates low flow in the appendage. There is coarse trabeculation of the appendage and a bright "coumadin" ridge shadow.  5. ATRIAL SEPTUM:  The atrial septum appears intact and is free of thrombus and/or masses.  There is no evidence for interatrial shunting by color doppler and saline microbubble.  6. RIGHT ATRIUM:  The right atrium is normal in size and function without any thrombus or masses.  7. MITRAL VALVE:  The mitral valve is normal in structure and function with Mild regurgitation.  There were no vegetations or stenosis.  8. AORTIC VALVE:  The aortic valve is normal in structure and function with No regurgitation.  There were no vegetations or stenosis  9. TRICUSPID VALVE:  The tricuspid valve is normal in structure and function with Trace regurgitation.  There were no vegetations or stenosis  10.  PULMONIC VALVE:  The tricuspid valve is normal in structure and function with No regurgitation.  There were no vegetations or stenosis.   11. AORTIC ARCH, ASCENDING AND DESCENDING AORTA:  The ascending aorta, aortic arch, and proximal descending aorta are tortuous and were not well visualized.  CARDIOVERSION:     Second Time Out: Verified patient identification, verified procedure, site/side was marked, verified correct patient position, special equipment/implants available, medications/allergies/relevent history reviewed, required imaging and test results available.  Performed  Procedure:  1. Patient placed on cardiac monitor, pulse oximetry, supplemental oxygen as necessary.  2. Sedation administered per anesthesia 3. Pacer pads placed anterior and posterior chest. 4. Cardioverted 2 time(s).  5. Cardioverted at 150J biphasic and 200J biphasic.  Complications:  Complications: None Patient did tolerate procedure well.  Cardioversion was  not successful at establishing sinus rhythm.  Impression:  1. No LAA thrombus. 2. LVEF 50-55% 3. Dilated LA with smoke 4. Mild MR  Recommendations:  1. Unsuccessful cardioversion. She remains in rate-controlled atrial fibrillation, however, there appears to have been interval improvement her LVEF.  She reports being fatigued "all the time", but was not aware of tachypalpitations. The fatigue may be her symptom of a-fib, therefore, we could consider other strategies to establish sinus rhythm.  Also, the sotalol may be contributing to her fatigue.  I would consider an alternative antiarrythmic such as tikosyn which is more efficacious and may not cause fatigue or referral to cardiac EP for evaluation for ablation.  Time Spent Directly with the Patient:  60 minutes   Chrystie Nose, MD, [facility] Attending Cardiologist [facility]  09/06/2012, 10:21 AM

## 2012-09-06 NOTE — Anesthesia Preprocedure Evaluation (Addendum)
Anesthesia Evaluation  Patient identified by MRN, date of birth, ID band Patient awake    Reviewed: Allergy & Precautions, H&P , NPO status , Patient's Chart, lab work & pertinent test results  Airway Mallampati: II TM Distance: >3 FB Neck ROM: Full    Dental  (+) Dental Advisory Given   Pulmonary shortness of breath,          Cardiovascular hypertension, +CHF + dysrhythmias     Neuro/Psych    GI/Hepatic GERD-  ,  Endo/Other  diabetes  Renal/GU      Musculoskeletal   Abdominal   Peds  Hematology   Anesthesia Other Findings   Reproductive/Obstetrics                          Anesthesia Physical Anesthesia Plan  ASA: III  Anesthesia Plan: MAC   Post-op Pain Management:    Induction: Intravenous  Airway Management Planned: Mask  Additional Equipment:   Intra-op Plan:   Post-operative Plan:   Informed Consent: I have reviewed the patients History and Physical, chart, labs and discussed the procedure including the risks, benefits and alternatives for the proposed anesthesia with the patient or authorized representative who has indicated his/her understanding and acceptance.   Dental advisory given  Plan Discussed with: CRNA  Anesthesia Plan Comments:         Anesthesia Quick Evaluation

## 2012-09-06 NOTE — Progress Notes (Signed)
  Echocardiogram 2D Echocardiogram has been performed.  Alison Bell 09/06/2012, 11:06 AM

## 2012-09-06 NOTE — Transfer of Care (Signed)
Immediate Anesthesia Transfer of Care Note  Patient: Alison Bell  Procedure(s) Performed: Procedure(s): TRANSESOPHAGEAL ECHOCARDIOGRAM (TEE) (N/A) CARDIOVERSION (N/A)  Patient Location: PACU  Anesthesia Type:MAC  Level of Consciousness: awake, alert  and oriented  Airway & Oxygen Therapy: Patient Spontanous Breathing and Patient connected to nasal cannula oxygen  Post-op Assessment: Report given to PACU RN and Post -op Vital signs reviewed and stable  Post vital signs: Reviewed and stable  Complications: No apparent anesthesia complications

## 2012-09-06 NOTE — Anesthesia Postprocedure Evaluation (Signed)
  Anesthesia Post-op Note  Patient: Alison Bell  Procedure(s) Performed: Procedure(s): TRANSESOPHAGEAL ECHOCARDIOGRAM (TEE) (N/A) CARDIOVERSION (N/A)  Patient Location: PACU  Anesthesia Type:MAC  Level of Consciousness: awake, alert  and oriented  Airway and Oxygen Therapy: Patient Spontanous Breathing and Patient connected to nasal cannula oxygen  Post-op Pain: none  Post-op Assessment: Post-op Vital signs reviewed  Post-op Vital Signs: Reviewed and stable  Complications: No apparent anesthesia complications

## 2012-09-06 NOTE — Preoperative (Signed)
Beta Blockers   Reason not to administer Beta Blockers:Not Applicable 

## 2012-09-07 ENCOUNTER — Encounter (HOSPITAL_COMMUNITY): Payer: Self-pay | Admitting: Internal Medicine

## 2012-10-12 ENCOUNTER — Encounter: Payer: Self-pay | Admitting: Internal Medicine

## 2012-10-20 ENCOUNTER — Ambulatory Visit (INDEPENDENT_AMBULATORY_CARE_PROVIDER_SITE_OTHER): Payer: Medicare Other | Admitting: Internal Medicine

## 2012-10-20 ENCOUNTER — Encounter: Payer: Self-pay | Admitting: Internal Medicine

## 2012-10-20 ENCOUNTER — Encounter: Payer: Self-pay | Admitting: *Deleted

## 2012-10-20 VITALS — BP 149/96 | HR 78 | Ht 62.0 in | Wt 192.8 lb

## 2012-10-20 DIAGNOSIS — I4891 Unspecified atrial fibrillation: Secondary | ICD-10-CM

## 2012-10-20 DIAGNOSIS — I4819 Other persistent atrial fibrillation: Secondary | ICD-10-CM

## 2012-10-20 DIAGNOSIS — I10 Essential (primary) hypertension: Secondary | ICD-10-CM

## 2012-10-20 NOTE — Progress Notes (Signed)
Primary Care Physician: Londell Moh, MD Referring Physician:  Dr Darden Amber P Alison Bell is a 67 y.o. female with a h/o persistent atrial fibrillation who presents today for EP consultation.  She has a known history of atrial fibrillation for which she has taken sotalol.  Recently she presented with symptoms of fatigue and was found to be in afib.  She underwent attempted cardioversion by Dr Rennis Golden which was unsuccessful.  She is adequately anticoagulated with eliquis.  Her fatigue is felt to be due to both afib and sotalol.  She is presently in sinus rhythm and reports that she feels "better".  She would like to stop sotalol long term.  Today, she denies symptoms of palpitations, chest pain, shortness of breath, orthopnea, PND, lower extremity edema, dizziness, presyncope, syncope, or neurologic sequela. The patient is tolerating medications without difficulties and is otherwise without complaint today.   Past Medical History  Diagnosis Date  . Diabetes mellitus without complication   . Persistent atrial fibrillation 08/25/2012  . HTN (hypertension) 08/25/2012  . Hyperlipidemia 08/25/2012  . Depression   . Shortness of breath   . GERD (gastroesophageal reflux disease)   . LV dysfunction, EF 45-50% due to a. fib per echo 08/27/12  08/28/2012  . Atrial enlargement, left    Past Surgical History  Procedure Laterality Date  . Tubal ligation    . Tee without cardioversion N/A 09/06/2012    Procedure: TRANSESOPHAGEAL ECHOCARDIOGRAM (TEE);  Surgeon: Chrystie Nose, MD;  Location: [facility] ENDOSCOPY;  Service: Cardiovascular;  Laterality: N/A;  . Cardioversion N/A 09/06/2012    Procedure: CARDIOVERSION;  Surgeon: Chrystie Nose, MD;  Location: [facility] ENDOSCOPY;  Service: Cardiovascular;  Laterality: N/A;    Current Outpatient Prescriptions  Medication Sig Dispense Refill  . amitriptyline (ELAVIL) 25 MG tablet Take 25 mg by mouth at bedtime.      Marland Kitchen apixaban (ELIQUIS) 5 MG TABS tablet Take 1 tablet (5  mg total) by mouth 2 (two) times daily.  60 tablet  11  . aspirin EC 81 MG tablet Take 81 mg by mouth daily.      . cetirizine (ZYRTEC) 10 MG tablet Take 10 mg by mouth daily.      . famotidine (PEPCID) 20 MG tablet Take 20 mg by mouth 2 (two) times daily.      . furosemide (LASIX) 40 MG tablet Take 1 tablet (40 mg total) by mouth daily.  30 tablet  5  . insulin aspart protamine-insulin aspart (NOVOLOG 70/30) (70-30) 100 UNIT/ML injection Inject 25-50 Units into the skin 2 (two) times daily. 50 units in the morning 25 units at night      . metFORMIN (GLUCOPHAGE) 1000 MG tablet Take 1,000 mg by mouth 2 (two) times daily with a meal.      . potassium chloride SA (K-DUR,KLOR-CON) 20 MEQ tablet Take 1 tablet (20 mEq total) by mouth daily.  30 tablet  5  . Prenatal Vit-Fe Fumarate-FA (M-VIT) tablet Take 1 tablet by mouth daily.      . sertraline (ZOLOFT) 100 MG tablet Take 100 mg by mouth daily.      . simvastatin (ZOCOR) 20 MG tablet Take 20 mg by mouth daily.      . sotalol (BETAPACE) 80 MG tablet Take 1 tablet (80 mg total) by mouth every 12 (twelve) hours.  60 tablet  5  . zolpidem (AMBIEN) 10 MG tablet        No current facility-administered medications for this visit.  Allergies  Allergen Reactions  . Benicar (Olmesartan) Anaphylaxis  . Cardizem (Diltiazem Hcl) Other (See Comments)    Extreme fatique  . Other Itching and Swelling    White meat chicken  To hands.     History   Social History  . Marital Status: Married    Spouse Name: N/A    Number of Children: N/A  . Years of Education: N/A   Occupational History  . Not on file.   Social History Main Topics  . Smoking status: Former Smoker    Quit date: 07/21/1994  . Smokeless tobacco: Never Used  . Alcohol Use: No  . Drug Use: No  . Sexually Active: Yes   Other Topics Concern  . Not on file   Social History Narrative   Lives in [place] with spouse.  Retired          Family History  Problem Relation Age  of Onset  . Heart attack Mother   . Diabetes type II Father     ROS- All systems are reviewed and negative except as per the HPI above  Physical Exam: Filed Vitals:   10/20/12 1054  BP: 149/96  Pulse: 78  Height: 5\' 2"  (1.575 m)  Weight: 192 lb 12.8 oz (87.454 kg)    GEN- The patient is well appearing, alert and oriented x 3 today.   Head- normocephalic, atraumatic Eyes-  Sclera clear, conjunctiva pink Ears- hearing intact Oropharynx- clear Neck- supple, no JVP Lymph- no cervical lymphadenopathy Lungs- Clear to ausculation bilaterally, normal work of breathing Heart- Regular rate and rhythm, no murmurs, rubs or gallops, PMI not laterally displaced GI- soft, NT, ND, + BS Extremities- no clubbing, cyanosis, or edema MS- no significant deformity or atrophy Skin- no rash or lesion Psych- euthymic mood, full affect Neuro- strength and sensation are intact  EKG today reveals sinus rhythm 66 bpm with RBBB, frequent PACs, nonspecific ST/T changes, Qtc 507 TEE 09/06/12- EF 50-55%, mild MR, dilated LA, moderate LVH myoview reveals normal perfusion,  More than 50 pages of records were reviewed from [facility]  Assessment and Plan:

## 2012-10-20 NOTE — Patient Instructions (Addendum)

## 2012-10-21 ENCOUNTER — Other Ambulatory Visit: Payer: Self-pay | Admitting: *Deleted

## 2012-10-21 DIAGNOSIS — I4891 Unspecified atrial fibrillation: Secondary | ICD-10-CM

## 2012-10-25 ENCOUNTER — Encounter (HOSPITAL_COMMUNITY): Payer: Self-pay | Admitting: Pharmacy Technician

## 2012-10-26 ENCOUNTER — Other Ambulatory Visit (INDEPENDENT_AMBULATORY_CARE_PROVIDER_SITE_OTHER): Payer: Medicare Other

## 2012-10-26 DIAGNOSIS — I4891 Unspecified atrial fibrillation: Secondary | ICD-10-CM

## 2012-10-26 LAB — CBC WITH DIFFERENTIAL/PLATELET
Basophils Absolute: 0 10*3/uL (ref 0.0–0.1)
Basophils Relative: 0.1 % (ref 0.0–3.0)
Eosinophils Absolute: 0.2 10*3/uL (ref 0.0–0.7)
Eosinophils Relative: 1.9 % (ref 0.0–5.0)
HCT: 33.9 % — ABNORMAL LOW (ref 36.0–46.0)
Hemoglobin: 11.1 g/dL — ABNORMAL LOW (ref 12.0–15.0)
Lymphocytes Relative: 23.3 % (ref 12.0–46.0)
Lymphs Abs: 2.7 10*3/uL (ref 0.7–4.0)
MCHC: 32.9 g/dL (ref 30.0–36.0)
MCV: 78.8 fl (ref 78.0–100.0)
Monocytes Absolute: 0.7 10*3/uL (ref 0.1–1.0)
Monocytes Relative: 5.9 % (ref 3.0–12.0)
Neutro Abs: 8.1 10*3/uL — ABNORMAL HIGH (ref 1.4–7.7)
Neutrophils Relative %: 68.8 % (ref 43.0–77.0)
Platelets: 485 10*3/uL — ABNORMAL HIGH (ref 150.0–400.0)
RBC: 4.3 Mil/uL (ref 3.87–5.11)
RDW: 18.1 % — ABNORMAL HIGH (ref 11.5–14.6)
WBC: 11.7 10*3/uL — ABNORMAL HIGH (ref 4.5–10.5)

## 2012-10-26 LAB — BASIC METABOLIC PANEL
BUN: 21 mg/dL (ref 6–23)
CO2: 30 mEq/L (ref 19–32)
Calcium: 9.7 mg/dL (ref 8.4–10.5)
Chloride: 99 mEq/L (ref 96–112)
Creatinine, Ser: 1 mg/dL (ref 0.4–1.2)
GFR: 58.42 mL/min — ABNORMAL LOW (ref >60.00)
Glucose, Bld: 101 mg/dL — ABNORMAL HIGH (ref 70–99)
Potassium: 4.7 mEq/L (ref 3.5–5.1)
Sodium: 138 mEq/L (ref 135–145)

## 2012-10-29 ENCOUNTER — Telehealth: Payer: Self-pay | Admitting: *Deleted

## 2012-10-29 NOTE — Telephone Encounter (Signed)
Patient called cath lab today asking about time of TEE on Monday.  According to endo, TEE scheduled for 10AM.  Patient should arrive at [facility].  NPO after midnight Sunday night.  Hold diabetes medications morning of procedure.  I spoke with Brett Canales, patient's husband, who is aware and agrees with plan.

## 2012-10-30 NOTE — Assessment & Plan Note (Signed)
Stable No change required today  

## 2012-10-30 NOTE — Assessment & Plan Note (Signed)
The patient has symptomatic persistent atrial fibrillation.  She has failed medical therapy with tikosyn.  Given her QT, I would not recommend increasing sotalol or tikosyn at this time.  She is appropriately anticoagulated with eliquis. Therapeutic strategies for afib including medicine and ablation were discussed in detail with the patient today. Risk, benefits, and alternatives to EP study and radiofrequency ablation for afib were also discussed in detail today. These risks include but are not limited to stroke, bleeding, vascular damage, tamponade, perforation, damage to the esophagus, lungs, and other structures, pulmonary vein stenosis, worsening renal function, and death. The patient understands these risk and wishes to proceed.  We will therefore proceed with catheter ablation at the next available time.  I will stop ASA as dual anticoagulation therapy has been shown to have increased bleeding risks without added benefit.

## 2012-11-01 ENCOUNTER — Encounter (HOSPITAL_COMMUNITY)
Admission: RE | Disposition: A | Discharge status: Home / Self Care | Payer: Self-pay | Source: Ambulatory Visit | Attending: Cardiology

## 2012-11-01 ENCOUNTER — Ambulatory Visit (HOSPITAL_COMMUNITY)
Admission: RE | Admit: 2012-11-01 | Discharge: 2012-11-01 | Disposition: A | Discharge status: Home / Self Care | Payer: Medicare Other | Source: Ambulatory Visit | Attending: Cardiology | Admitting: Cardiology

## 2012-11-01 ENCOUNTER — Encounter (HOSPITAL_COMMUNITY): Payer: Self-pay | Admitting: *Deleted

## 2012-11-01 DIAGNOSIS — Z794 Long term (current) use of insulin: Secondary | ICD-10-CM | POA: Insufficient documentation

## 2012-11-01 DIAGNOSIS — I079 Rheumatic tricuspid valve disease, unspecified: Secondary | ICD-10-CM | POA: Insufficient documentation

## 2012-11-01 DIAGNOSIS — Z7901 Long term (current) use of anticoagulants: Secondary | ICD-10-CM | POA: Insufficient documentation

## 2012-11-01 DIAGNOSIS — I4891 Unspecified atrial fibrillation: Secondary | ICD-10-CM

## 2012-11-01 DIAGNOSIS — Z8249 Family history of ischemic heart disease and other diseases of the circulatory system: Secondary | ICD-10-CM | POA: Insufficient documentation

## 2012-11-01 DIAGNOSIS — Z888 Allergy status to other drugs, medicaments and biological substances status: Secondary | ICD-10-CM | POA: Insufficient documentation

## 2012-11-01 DIAGNOSIS — K219 Gastro-esophageal reflux disease without esophagitis: Secondary | ICD-10-CM | POA: Insufficient documentation

## 2012-11-01 DIAGNOSIS — Z87891 Personal history of nicotine dependence: Secondary | ICD-10-CM | POA: Insufficient documentation

## 2012-11-01 DIAGNOSIS — I519 Heart disease, unspecified: Secondary | ICD-10-CM | POA: Insufficient documentation

## 2012-11-01 DIAGNOSIS — I10 Essential (primary) hypertension: Secondary | ICD-10-CM | POA: Insufficient documentation

## 2012-11-01 DIAGNOSIS — E119 Type 2 diabetes mellitus without complications: Secondary | ICD-10-CM | POA: Insufficient documentation

## 2012-11-01 DIAGNOSIS — Z79899 Other long term (current) drug therapy: Secondary | ICD-10-CM | POA: Insufficient documentation

## 2012-11-01 DIAGNOSIS — Z7982 Long term (current) use of aspirin: Secondary | ICD-10-CM | POA: Insufficient documentation

## 2012-11-01 DIAGNOSIS — I517 Cardiomegaly: Secondary | ICD-10-CM | POA: Insufficient documentation

## 2012-11-01 HISTORY — PX: TEE WITHOUT CARDIOVERSION: SHX5443

## 2012-11-01 LAB — GLUCOSE, CAPILLARY: Glucose-Capillary: 152 mg/dL — ABNORMAL HIGH (ref 70–99)

## 2012-11-01 SURGERY — ECHOCARDIOGRAM, TRANSESOPHAGEAL
Anesthesia: Moderate Sedation

## 2012-11-01 MED ORDER — MIDAZOLAM HCL 10 MG/2ML IJ SOLN
INTRAMUSCULAR | Status: DC | PRN
  Administered 2012-11-01: 2 mg via INTRAVENOUS
  Administered 2012-11-01: 1 mg via INTRAVENOUS
  Administered 2012-11-01: 2 mg via INTRAVENOUS

## 2012-11-01 MED ORDER — SODIUM CHLORIDE 0.9 % IV SOLN
INTRAVENOUS | Status: DC
  Administered 2012-11-01: 09:00:00 via INTRAVENOUS

## 2012-11-01 MED ORDER — FENTANYL CITRATE 0.05 MG/ML IJ SOLN
INTRAMUSCULAR | Status: AC
  Filled 2012-11-01: qty 2

## 2012-11-01 MED ORDER — FENTANYL CITRATE 0.05 MG/ML IJ SOLN
INTRAMUSCULAR | Status: DC | PRN
  Administered 2012-11-01 (×3): 25 ug via INTRAVENOUS

## 2012-11-01 MED ORDER — BUTAMBEN-TETRACAINE-BENZOCAINE 2-2-14 % EX AERO
INHALATION_SPRAY | CUTANEOUS | Status: DC | PRN
  Administered 2012-11-01: 2 via TOPICAL

## 2012-11-01 MED ORDER — MIDAZOLAM HCL 5 MG/ML IJ SOLN
INTRAMUSCULAR | Status: AC
  Filled 2012-11-01: qty 2

## 2012-11-01 NOTE — CV Procedure (Signed)
Procedure performed with sedation with 5 mg IV versed and 75 micrograms of IV Fentanyl. No complications.  Preliminary findings:  Mild TR, trace MR.  Otherwise normal study.   No thrombus seen.   Full report to follow.  Joneric Streight [place] MD, [facility]

## 2012-11-01 NOTE — H&P (View-Only) (Signed)
 Primary Care Physician: PHARR,WALTER DAVIDSON, MD Referring Physician:  Dr Berry   Alison Bell is a 67 y.o. female with a h/o persistent atrial fibrillation who presents today for EP consultation.  She has a known history of atrial fibrillation for which she has taken sotalol.  Recently she presented with symptoms of fatigue and was found to be in afib.  She underwent attempted cardioversion by Dr Hilty which was unsuccessful.  She is adequately anticoagulated with eliquis.  Her fatigue is felt to be due to both afib and sotalol.  She is presently in sinus rhythm and reports that she feels "better".  She would like to stop sotalol long term.  Today, she denies symptoms of palpitations, chest pain, shortness of breath, orthopnea, PND, lower extremity edema, dizziness, presyncope, syncope, or neurologic sequela. The patient is tolerating medications without difficulties and is otherwise without complaint today.   Past Medical History  Diagnosis Date  . Diabetes mellitus without complication   . Persistent atrial fibrillation 08/25/2012  . HTN (hypertension) 08/25/2012  . Hyperlipidemia 08/25/2012  . Depression   . Shortness of breath   . GERD (gastroesophageal reflux disease)   . LV dysfunction, EF 45-50% due to a. fib per echo 08/27/12  08/28/2012  . Atrial enlargement, left    Past Surgical History  Procedure Laterality Date  . Tubal ligation    . Tee without cardioversion N/A 09/06/2012    Procedure: TRANSESOPHAGEAL ECHOCARDIOGRAM (TEE);  Surgeon: Kenneth C. Hilty, MD;  Location: [facility];  Service: Cardiovascular;  Laterality: N/A;  . Cardioversion N/A 09/06/2012    Procedure: CARDIOVERSION;  Surgeon: Kenneth C. Hilty, MD;  Location: [facility];  Service: Cardiovascular;  Laterality: N/A;    Current Outpatient Prescriptions  Medication Sig Dispense Refill  . amitriptyline (ELAVIL) 25 MG tablet Take 25 mg by mouth at bedtime.      . apixaban (ELIQUIS) 5 MG TABS tablet Take 1 tablet (5  mg total) by mouth 2 (two) times daily.  60 tablet  11  . aspirin EC 81 MG tablet Take 81 mg by mouth daily.      . cetirizine (ZYRTEC) 10 MG tablet Take 10 mg by mouth daily.      . famotidine (PEPCID) 20 MG tablet Take 20 mg by mouth 2 (two) times daily.      . furosemide (LASIX) 40 MG tablet Take 1 tablet (40 mg total) by mouth daily.  30 tablet  5  . insulin aspart protamine-insulin aspart (NOVOLOG 70/30) (70-30) 100 UNIT/ML injection Inject 25-50 Units into the skin 2 (two) times daily. 50 units in the morning 25 units at night      . metFORMIN (GLUCOPHAGE) 1000 MG tablet Take 1,000 mg by mouth 2 (two) times daily with a meal.      . potassium chloride SA (K-DUR,KLOR-CON) 20 MEQ tablet Take 1 tablet (20 mEq total) by mouth daily.  30 tablet  5  . Prenatal Vit-Fe Fumarate-FA (M-VIT) tablet Take 1 tablet by mouth daily.      . sertraline (ZOLOFT) 100 MG tablet Take 100 mg by mouth daily.      . simvastatin (ZOCOR) 20 MG tablet Take 20 mg by mouth daily.      . sotalol (BETAPACE) 80 MG tablet Take 1 tablet (80 mg total) by mouth every 12 (twelve) hours.  60 tablet  5  . zolpidem (AMBIEN) 10 MG tablet        No current facility-administered medications for this visit.      Allergies  Allergen Reactions  . Benicar (Olmesartan) Anaphylaxis  . Cardizem (Diltiazem Hcl) Other (See Comments)    Extreme fatique  . Other Itching and Swelling    White meat chicken  To hands.     History   Social History  . Marital Status: Married    Spouse Name: N/A    Number of Children: N/A  . Years of Education: N/A   Occupational History  . Not on file.   Social History Main Topics  . Smoking status: Former Smoker    Quit date: 07/21/1994  . Smokeless tobacco: Never Used  . Alcohol Use: No  . Drug Use: No  . Sexually Active: Yes   Other Topics Concern  . Not on file   Social History Narrative   Lives in [place] with spouse.  Retired          Family History  Problem Relation Age  of Onset  . Heart attack Mother   . Diabetes type II Father     ROS- All systems are reviewed and negative except as per the HPI above  Physical Exam: Filed Vitals:   10/20/12 1054  BP: 149/96  Pulse: 78  Height: 5' 2" (1.575 m)  Weight: 192 lb 12.8 oz (87.454 kg)    GEN- The patient is well appearing, alert and oriented x 3 today.   Head- normocephalic, atraumatic Eyes-  Sclera clear, conjunctiva pink Ears- hearing intact Oropharynx- clear Neck- supple, no JVP Lymph- no cervical lymphadenopathy Lungs- Clear to ausculation bilaterally, normal work of breathing Heart- Regular rate and rhythm, no murmurs, rubs or gallops, PMI not laterally displaced GI- soft, NT, ND, + BS Extremities- no clubbing, cyanosis, or edema MS- no significant deformity or atrophy Skin- no rash or lesion Psych- euthymic mood, full affect Neuro- strength and sensation are intact  EKG today reveals sinus rhythm 66 bpm with RBBB, frequent PACs, nonspecific ST/T changes, Qtc 507 TEE 09/06/12- EF 50-55%, mild MR, dilated LA, moderate LVH myoview reveals normal perfusion,  More than 50 pages of records were reviewed from [facility]  Assessment and Plan:  

## 2012-11-01 NOTE — Progress Notes (Signed)
  Echocardiogram Echocardiogram Transesophageal has been performed.  Alison Bell 11/01/2012, 11:16 AM

## 2012-11-01 NOTE — Interval H&P Note (Signed)
History and Physical Interval Note:  11/01/2012 10:05 AM  Alison Bell  has presented today for surgery, with the diagnosis of pre-ablation for afib  The various methods of treatment have been discussed with the patient and family. After consideration of risks, benefits and other options for treatment, the patient has consented to  Procedure(s): TRANSESOPHAGEAL ECHOCARDIOGRAM (TEE) (N/A) as a surgical intervention .  The patient's history has been reviewed, patient examined, no change in status, stable for surgery.  I have reviewed the patient's chart and labs.  Questions were answered to the patient's satisfaction.     Theron Arista [facility] 11/01/2012 10:05 AM

## 2012-11-02 ENCOUNTER — Encounter (HOSPITAL_COMMUNITY): Payer: Self-pay | Admitting: Anesthesiology

## 2012-11-02 ENCOUNTER — Encounter (HOSPITAL_COMMUNITY): Payer: Self-pay | Admitting: Cardiology

## 2012-11-02 ENCOUNTER — Ambulatory Visit (HOSPITAL_COMMUNITY): Payer: Medicare Other | Admitting: Anesthesiology

## 2012-11-02 ENCOUNTER — Ambulatory Visit (HOSPITAL_COMMUNITY)
Admission: RE | Admit: 2012-11-02 | Discharge: 2012-11-03 | Disposition: A | Discharge status: Home / Self Care | Payer: Medicare Other | Source: Ambulatory Visit | Attending: Internal Medicine | Admitting: Internal Medicine

## 2012-11-02 ENCOUNTER — Encounter (HOSPITAL_COMMUNITY)
Admission: RE | Disposition: A | Discharge status: Home / Self Care | Payer: Self-pay | Source: Ambulatory Visit | Attending: Internal Medicine

## 2012-11-02 DIAGNOSIS — E785 Hyperlipidemia, unspecified: Secondary | ICD-10-CM | POA: Insufficient documentation

## 2012-11-02 DIAGNOSIS — I4891 Unspecified atrial fibrillation: Secondary | ICD-10-CM | POA: Diagnosis present

## 2012-11-02 DIAGNOSIS — I4819 Other persistent atrial fibrillation: Secondary | ICD-10-CM | POA: Diagnosis present

## 2012-11-02 DIAGNOSIS — R9389 Abnormal findings on diagnostic imaging of other specified body structures: Secondary | ICD-10-CM

## 2012-11-02 DIAGNOSIS — K219 Gastro-esophageal reflux disease without esophagitis: Secondary | ICD-10-CM | POA: Insufficient documentation

## 2012-11-02 DIAGNOSIS — E119 Type 2 diabetes mellitus without complications: Secondary | ICD-10-CM | POA: Insufficient documentation

## 2012-11-02 DIAGNOSIS — I10 Essential (primary) hypertension: Secondary | ICD-10-CM | POA: Insufficient documentation

## 2012-11-02 HISTORY — PX: ATRIAL FIBRILLATION ABLATION: SHX5456

## 2012-11-02 LAB — POCT ACTIVATED CLOTTING TIME
Activated Clotting Time: 241 seconds
Activated Clotting Time: 285 seconds
Activated Clotting Time: 279 seconds
Activated Clotting Time: 175 seconds

## 2012-11-02 LAB — MRSA PCR SCREENING: MRSA by PCR: NEGATIVE

## 2012-11-02 LAB — GLUCOSE, CAPILLARY
Glucose-Capillary: 86 mg/dL (ref 70–99)
Glucose-Capillary: 121 mg/dL — ABNORMAL HIGH (ref 70–99)
Glucose-Capillary: 119 mg/dL — ABNORMAL HIGH (ref 70–99)

## 2012-11-02 SURGERY — ATRIAL FIBRILLATION ABLATION
Anesthesia: Monitor Anesthesia Care

## 2012-11-02 MED ORDER — AMITRIPTYLINE HCL 25 MG PO TABS
25.0000 mg | ORAL_TABLET | Freq: Every day | ORAL | Status: DC
  Administered 2012-11-02: 25 mg via ORAL
  Filled 2012-11-02 (×2): qty 1

## 2012-11-02 MED ORDER — ARTIFICIAL TEARS OP OINT
TOPICAL_OINTMENT | OPHTHALMIC | Status: DC | PRN
  Administered 2012-11-02: 1 via OPHTHALMIC

## 2012-11-02 MED ORDER — SIMVASTATIN 20 MG PO TABS
20.0000 mg | ORAL_TABLET | Freq: Every day | ORAL | Status: DC
  Administered 2012-11-02 – 2012-11-03 (×2): 20 mg via ORAL
  Filled 2012-11-02 (×2): qty 1

## 2012-11-02 MED ORDER — LACTATED RINGERS IV SOLN
INTRAVENOUS | Status: DC | PRN
  Administered 2012-11-02 (×2): via INTRAVENOUS

## 2012-11-02 MED ORDER — SODIUM CHLORIDE 0.9 % IV SOLN: 250.0000 mL | INTRAVENOUS | Status: DC | PRN

## 2012-11-02 MED ORDER — PROTAMINE SULFATE 10 MG/ML IV SOLN
INTRAVENOUS | Status: DC | PRN
  Administered 2012-11-02 (×3): 10 mg via INTRAVENOUS

## 2012-11-02 MED ORDER — PROPOFOL 10 MG/ML IV BOLUS
INTRAVENOUS | Status: DC | PRN
  Administered 2012-11-02: 20 mg via INTRAVENOUS
  Administered 2012-11-02: 30 mg via INTRAVENOUS
  Administered 2012-11-02: 150 mg via INTRAVENOUS

## 2012-11-02 MED ORDER — SOTALOL HCL 80 MG PO TABS
80.0000 mg | ORAL_TABLET | Freq: Two times a day (BID) | ORAL | Status: DC
  Administered 2012-11-02 – 2012-11-03 (×3): 80 mg via ORAL
  Filled 2012-11-02 (×4): qty 1

## 2012-11-02 MED ORDER — HYDROXYUREA 500 MG PO CAPS
ORAL_CAPSULE | ORAL | Status: AC
  Filled 2012-11-02: qty 1

## 2012-11-02 MED ORDER — ROCURONIUM BROMIDE 100 MG/10ML IV SOLN
INTRAVENOUS | Status: DC | PRN
  Administered 2012-11-02: 20 mg via INTRAVENOUS
  Administered 2012-11-02: 3 mg via INTRAVENOUS
  Administered 2012-11-02: 30 mg via INTRAVENOUS

## 2012-11-02 MED ORDER — NEOSTIGMINE METHYLSULFATE 1 MG/ML IJ SOLN
INTRAMUSCULAR | Status: DC | PRN
  Administered 2012-11-02: 4 mg via INTRAVENOUS

## 2012-11-02 MED ORDER — SODIUM CHLORIDE 0.9 % IJ SOLN: 3.0000 mL | INTRAMUSCULAR | Status: DC | PRN

## 2012-11-02 MED ORDER — ZOLPIDEM TARTRATE 5 MG PO TABS
5.0000 mg | ORAL_TABLET | Freq: Every evening | ORAL | Status: DC | PRN
  Administered 2012-11-02: 5 mg via ORAL
  Filled 2012-11-02: qty 1

## 2012-11-02 MED ORDER — HEPARIN SODIUM (PORCINE) 1000 UNIT/ML IJ SOLN
INTRAMUSCULAR | Status: AC
  Filled 2012-11-02: qty 1

## 2012-11-02 MED ORDER — FENTANYL CITRATE 0.05 MG/ML IJ SOLN
INTRAMUSCULAR | Status: DC | PRN
  Administered 2012-11-02: 50 ug via INTRAVENOUS
  Administered 2012-11-02: 100 ug via INTRAVENOUS

## 2012-11-02 MED ORDER — ACETAMINOPHEN 325 MG PO TABS: 650.0000 mg | ORAL_TABLET | ORAL | Status: DC | PRN

## 2012-11-02 MED ORDER — ONDANSETRON HCL 4 MG/2ML IJ SOLN
INTRAMUSCULAR | Status: DC | PRN
  Administered 2012-11-02: 4 mg via INTRAVENOUS

## 2012-11-02 MED ORDER — INSULIN ASPART PROT & ASPART (70-30 MIX) 100 UNIT/ML ~~LOC~~ SUSP
50.0000 [IU] | Freq: Every day | SUBCUTANEOUS | Status: DC
  Administered 2012-11-03: 50 [IU] via SUBCUTANEOUS
  Filled 2012-11-02: qty 10

## 2012-11-02 MED ORDER — SODIUM CHLORIDE 0.9 % IV SOLN
10.0000 mg | INTRAVENOUS | Status: DC | PRN
  Administered 2012-11-02: 20 ug/min via INTRAVENOUS

## 2012-11-02 MED ORDER — FAMOTIDINE 20 MG PO TABS
20.0000 mg | ORAL_TABLET | Freq: Two times a day (BID) | ORAL | Status: DC
  Administered 2012-11-02 – 2012-11-03 (×3): 20 mg via ORAL
  Filled 2012-11-02 (×4): qty 1

## 2012-11-02 MED ORDER — SODIUM CHLORIDE 0.9 % IJ SOLN
3.0000 mL | Freq: Two times a day (BID) | INTRAMUSCULAR | Status: DC
  Administered 2012-11-02 – 2012-11-03 (×3): 3 mL via INTRAVENOUS

## 2012-11-02 MED ORDER — HEPARIN SODIUM (PORCINE) 1000 UNIT/ML IJ SOLN
INTRAMUSCULAR | Status: DC | PRN
  Administered 2012-11-02: 10000 [IU] via INTRAVENOUS
  Administered 2012-11-02: 2000 [IU] via INTRAVENOUS
  Administered 2012-11-02: 5000 [IU] via INTRAVENOUS

## 2012-11-02 MED ORDER — ONDANSETRON HCL 4 MG/2ML IJ SOLN: 4.0000 mg | Freq: Four times a day (QID) | INTRAMUSCULAR | Status: DC | PRN

## 2012-11-02 MED ORDER — HYDROCODONE-ACETAMINOPHEN 5-325 MG PO TABS
1.0000 | ORAL_TABLET | ORAL | Status: DC | PRN
  Administered 2012-11-03: 2 via ORAL
  Filled 2012-11-02: qty 2

## 2012-11-02 MED ORDER — APIXABAN 5 MG PO TABS
5.0000 mg | ORAL_TABLET | Freq: Two times a day (BID) | ORAL | Status: DC
  Administered 2012-11-02 – 2012-11-03 (×2): 5 mg via ORAL
  Filled 2012-11-02 (×3): qty 1

## 2012-11-02 MED ORDER — SERTRALINE HCL 100 MG PO TABS
100.0000 mg | ORAL_TABLET | Freq: Every day | ORAL | Status: DC
  Administered 2012-11-02 – 2012-11-03 (×2): 100 mg via ORAL
  Filled 2012-11-02 (×2): qty 1

## 2012-11-02 MED ORDER — ALBUTEROL SULFATE HFA 108 (90 BASE) MCG/ACT IN AERS
INHALATION_SPRAY | RESPIRATORY_TRACT | Status: DC | PRN
  Administered 2012-11-02: 2 via RESPIRATORY_TRACT

## 2012-11-02 MED ORDER — DEXTROSE 5 % IV SOLN
INTRAVENOUS | Status: AC
  Filled 2012-11-02: qty 250

## 2012-11-02 MED ORDER — HEPARIN SODIUM (PORCINE) 1000 UNIT/ML IJ SOLN: INTRAMUSCULAR | Status: DC | PRN

## 2012-11-02 MED ORDER — POTASSIUM CHLORIDE CRYS ER 20 MEQ PO TBCR
20.0000 meq | EXTENDED_RELEASE_TABLET | Freq: Every day | ORAL | Status: DC
  Administered 2012-11-02 – 2012-11-03 (×2): 20 meq via ORAL
  Filled 2012-11-02 (×2): qty 1

## 2012-11-02 MED ORDER — GLYCOPYRROLATE 0.2 MG/ML IJ SOLN
INTRAMUSCULAR | Status: DC | PRN
  Administered 2012-11-02: 0.6 mg via INTRAVENOUS

## 2012-11-02 MED ORDER — INSULIN ASPART PROT & ASPART (70-30 MIX) 100 UNIT/ML ~~LOC~~ SUSP
25.0000 [IU] | Freq: Every day | SUBCUTANEOUS | Status: DC
  Administered 2012-11-02: 25 [IU] via SUBCUTANEOUS
  Filled 2012-11-02: qty 10

## 2012-11-02 MED ORDER — INSULIN ASPART PROT & ASPART (70-30 MIX) 100 UNIT/ML ~~LOC~~ SUSP: 25.0000 [IU] | Freq: Two times a day (BID) | SUBCUTANEOUS | Status: DC

## 2012-11-02 NOTE — Discharge Summary (Signed)
ELECTROPHYSIOLOGY DISCHARGE SUMMARY    [name],  MRN: [id number], DOB/AGE: 11/15/1945 67 y.o.  Admit date: 11/02/2012 Discharge date: 11/03/2012  Primary Care Physician: Alison Brunette, MD Primary Cardiologist: Alison Batty, MD  Primary Discharge Diagnosis:  1. Persistent AF s/p EPS +RF ablation for AF  Secondary Discharge Diagnoses:  1. Mild LV dysfunction, EF 45-50% by echo 08/27/2012 2. HTN 3. DM 4. Dyslipidemia 5. GERD  Procedures This Admission:   1. Comprehensive electrophysiologic study  2. Coronary sinus pacing and recording 3. Three-dimensional mapping of atrial fibrillation  4. Ablation of atrial fibrillation  5. Intracardiac echocardiography  6. Transseptal puncture of an intact septum 7. Rotational Angiography with processing at an independent workstation  8. Arrhythmia induction with pacing with isuprel infusion CONCLUSIONS:  1. Sinus rhythm upon presentation.  2. Rotational Angiography reveals a moderate sized left atrium with four pulmonary veins and a common ostium to the left superior and inferior pulmonary veins.  3. Successful electrical isolation and anatomical encircling of all four pulmonary veins with radiofrequency current with a WACA technique.  4. No inducible arrhythmias following ablation both on and off of Isuprel.  5. No early apparent complications.  History and Hospital Course:  Alison Bell is a 67 year old woman with persistent atrial fibrillation who was initially diagnosed with atrial fibrillation after presenting with symptoms of fatgiue. [name] has experienced increasing frequency and duration of atrial fibrillation since that time. She has failed medical therapy with diltiazem and sotalol and therefore presented yesterday for EPS +RF ablation of atrial fibrillation. Please see details of the procedure as outlined above. [name] tolerated this procedure well without any immediate complication. She remains hemodynamically  stable and afebrile. Her groin site is intact without significant bleeding or hematoma. Telemetry shows SR. She has been ambulating without difficulty. Sotalol was discontinued otherwise there were no changes made to her medications. She has been given discharge instructions including wound care and activity restrictions. She will follow-up in clinic in 12 weeks. She has been seen, examined and deemed stable for discharge today by Dr. Hillis Bell.  Physical Exam: Vitals: Blood pressure 114/52, pulse 70, temperature 99.2 F (37.3 C), temperature source Oral, resp. rate 19, height 5\' 2"  (1.575 m), weight 192 lb (87.091 kg), SpO2 93.00%.  General: WD, WN 67 year old female in no acute distress. Heart: RRR. No murmur, rub, S3 or S4.  Lungs: CTA bilaterally. No wheezes, rales or rhonchi. Abdomen: Soft, nondistended. Extremities: No cyanosis, clubbing or edema. Right groin site intact without bleeding or hematoma.  Labs: Lab Results  Component Value Date   WBC 11.7* 10/26/2012   HGB 11.1* 10/26/2012   HCT 33.9* 10/26/2012   MCV 78.8 10/26/2012   PLT 485.0* 10/26/2012   No results found for this basename: NA, K, CL, CO2, BUN, CREATININE, CALCIUM, LABALBU, PROT, BILITOT, ALKPHOS, ALT, AST, GLUCOSE,  in the last 168 hours   Disposition:  The patient is being discharged in stable condition.  Follow-up:     Follow-up Information   Follow up with Alison Range, MD On 02/02/2013. (At 9:00 AM)    Contact information:   [address] [place] [address] [phone]     Discharge Medications:    Medication List    STOP taking these medications       sotalol 80 MG tablet  Commonly known as:  BETAPACE      TAKE these medications       amitriptyline  25 MG tablet  Commonly known as:  ELAVIL  Take 25 mg by mouth at bedtime.     apixaban 5 MG Tabs tablet  Commonly known as:  ELIQUIS  Take 1 tablet (5 mg total) by mouth 2 (two) times daily.     cetirizine 10 MG tablet    Commonly known as:  ZYRTEC  Take 10 mg by mouth daily.     famotidine 20 MG tablet  Commonly known as:  PEPCID  Take 20 mg by mouth 2 (two) times daily.     furosemide 40 MG tablet  Commonly known as:  LASIX  Take 1 tablet (40 mg total) by mouth daily.     insulin aspart protamine-insulin aspart (70-30) 100 UNIT/ML injection  Commonly known as:  NOVOLOG 70/30  Inject 25-50 Units into the skin 2 (two) times daily. 50 units in the morning  25 units at night     metFORMIN 1000 MG tablet  Commonly known as:  GLUCOPHAGE  Take 1,000 mg by mouth 2 (two) times daily with a meal.     multivitamin with minerals Tabs  Take 1 tablet by mouth daily.     potassium chloride SA 20 MEQ tablet  Commonly known as:  K-DUR,KLOR-CON  Take 1 tablet (20 mEq total) by mouth daily.     sertraline 100 MG tablet  Commonly known as:  ZOLOFT  Take 100 mg by mouth daily.     simvastatin 20 MG tablet  Commonly known as:  ZOCOR  Take 20 mg by mouth daily.     zolpidem 10 MG tablet  Commonly known as:  AMBIEN  Take 10 mg by mouth at bedtime as needed for sleep.       Duration of Discharge Encounter: Greater than 30 minutes including physician time.  Alison Patricia, PA-C 11/03/2012, 9:59 AM  Alison Range, MD

## 2012-11-02 NOTE — Preoperative (Signed)
Beta Blockers   Reason not to administer Beta Blockers:Pt states she was instructed not to take Betapace this a.m.

## 2012-11-02 NOTE — Anesthesia Postprocedure Evaluation (Signed)
  Anesthesia Post-op Note  Patient: Alison Bell  Procedure(s) Performed: Procedure(s): ATRIAL FIBRILLATION ABLATION (N/A)  Patient Location: PACU  Anesthesia Type:General  Level of Consciousness: awake and pateint uncooperative  Airway and Oxygen Therapy: Patient Spontanous Breathing and Patient connected to nasal cannula oxygen  Post-op Pain: mild  Post-op Assessment: Post-op Vital signs reviewed, Patient's Cardiovascular Status Stable, Respiratory Function Stable, Patent Airway and No signs of Nausea or vomiting  Post-op Vital Signs: Reviewed and stable  Complications: No apparent anesthesia complications

## 2012-11-02 NOTE — Op Note (Signed)
SURGEON:  Hillis Range, MD  PREPROCEDURE DIAGNOSES: 1. Persistent atrial fibrillation.  POSTPROCEDURE DIAGNOSES: 1. Persistent atrial fibrillation.  PROCEDURES: 1. Comprehensive electrophysiologic study. 2. Coronary sinus pacing and recording. 3. Three-dimensional mapping of atrial fibrillation 4. Ablation of atrial fibrillation 5. Intracardiac echocardiography. 6. Transseptal puncture of an intact septum. 7. Rotational Angiography with processing at an independent workstation 8. Arrhythmia induction with pacing with isuprel infusion  INTRODUCTION:  Alison Bell is a 67 y.o. female with a history of persistent atrial fibrillation who now presents for EP study and radiofrequency ablation.  The patient reports initially being diagnosed with atrial fibrillation after presenting with symptomaticfatgiue. The patient reports increasing frequency and duration of atrial fibrillation since that time.  The patient has failed medical therapy with diltiazem and sotalol.  The patient therefore presents today for catheter ablation of atrial fibrillation.  DESCRIPTION OF PROCEDURE:  Informed written consent was obtained, and the patient was brought to the electrophysiology lab in a fasting state.  The patient was adequately sedated with intravenous medications as outlined in the anesthesia report.  The patient's left and right groins were prepped and draped in the usual sterile fashion by the EP lab staff.  Using a percutaneous Seldinger technique, two 7-French and one 11-French hemostasis sheaths were placed into the right common femoral vein.    3 Dimensional Rotational Angiography: A 5 french pigtail catheter was introduced through the right common femoral vein and advanced into the inferior venocava.  3 demential rotational angiography was then performed by power injection of 100cc of nonionic contrast.  Reprocessing at an independent work station was then performed.   This demonstrated a moderate sized  left atrium with a common ostium to the left sided pulmonary veins.   A 3 dimensional rendering of the left atrium was then merged using [employer] onto the Well[employer] system and registered with intracardiac echo (see below).  The pigtail catheter was then removed.  Catheter Placement:  A 7-French [employer]ense [place] Decapolar coronary sinus catheter was introduced through the right common femoral vein and advanced into the coronary sinus for recording and pacing from this location.  A 6-French quadripolar Josephson catheter was introduced through the right common femoral vein and advanced into the right ventricle for recording and pacing.  This catheter was then pulled back to the His bundle location.    Initial Measurements: The patient presented to the electrophysiology lab in sinus rhythm with frequent PVC (RBB inferior axis).  Her PR interval measured with a QRS duringation of 133 msec and a QT interval of 493 msec.  The AH interval measured 92 and the HV interval measured 55 msec.     Intracardiac Echocardiography: A 10-French [employer] [place] AcuNav intracardiac echocardiography catheter was introduced through the left common femoral vein and advanced into the right atrium. Intracardiac echocardiography was performed of the left atrium, and a three-dimensional anatomical rendering of the left atrium was performed using CARTO sound technology.  The patient was noted to have a moderate sized left atrium.  The interatrial septum was prominent but not aneurysmal. All 4 pulmonary veins were visualized.  There was a common ostium to the left and right pulmonary veins.  The right superior pulmonary vein was large in size.   The left atrial appendage was visualized and did not reveal thrombus.   There was no evidence of pulmonary vein stenosis.   Transseptal Puncture: The middle right common femoral vein sheath was exchanged for an 8.5 [place] SL2 transseptal  sheath and transseptal  access was achieved in a standard fashion using a [place]rough needle under biplane fluoroscopy with intracardiac echocardiography confirmation of the transseptal puncture.  Once transseptal access had been achieved, heparin was administered intravenously and intra- arterially in order to maintain an ACT of greater than 300 seconds throughout the procedure.    3D Mapping and Ablation: The His bundle catheter was removed and in its place a 3.5 mm [employer]ense [place] EZ [employer] ablation catheter was advanced into the right atrium.  The transseptal sheath was pulled back into the IVC over a guidewire.  The ablation catheter was advanced across the transseptal hole using the wire as a guide.  The transseptal sheath was then re-advanced over the guidewire into the left atrium.  A duodecapolar Biosense [place] circular mapping catheter was introduced through the transseptal sheath and positioned over the mouth of all 4 pulmonary veins.  Catheter manipulation was difficult due to a very anterior transseptal access.  Three-dimensional electroanatomical mapping was performed using CARTO technology.  This demonstrated electrical activity within all four pulmonary veins at baseline. The patient underwent successful sequential electrical isolation and anatomical encircling of all four pulmonary veins using radiofrequency current with a WACA (wide atrial circumferential ablation technique) using the circular mapping catheter as a guide.   Measurements Following Ablation: Following ablation, Isuprel was infused up to 20 mcg/min with no inducible atrial fibrillation, atrial tachycardia, atrial flutter, or sustained PACs. In sinus rhythm with RR interval was 756, with PR 178 msec, QRS 131 msec, and Qtc 436 msec.  Following ablation the AH interval measured with an HV interval of 56 msec. Ventricular pacing was performed, which revealed midline decremental VA conduction with a VA Wenckebach cycle length of 420  msec.  Rapid atrial pacing was performed, which revealed an AV Wenckebach cycle length of 310 msec.  Electroisolation was then again confirmed in all four pulmonary veins.  The procedure was therefore considered completed.  All catheters were removed, and the sheaths were aspirated and flushed.  The patient was transferred to the recovery area for sheath removal per protocol.  A limited bedside transthoracic echocardiogram revealed no pericardial effusion.  There were no early apparent complications.  CONCLUSIONS: 1. Sinus rhythm upon presentation.   2. Rotational Angiography reveals a moderate sized left atrium with four pulmonary veins and a common ostium to the left superior and inferior pulmonary veins. 3. Successful electrical isolation and anatomical encircling of all four pulmonary veins with radiofrequency current with a WACA technique. 4. No inducible arrhythmias following ablation both on and off of Isuprel 5. No early apparent complications.   Fayrene Fearing Natanael Saladin,MD 10:51 AM 11/02/2012

## 2012-11-02 NOTE — Interval H&P Note (Signed)
History and Physical Interval Note:  11/02/2012 7:34 AM  Alison Bell  has presented today for surgery, with the diagnosis of afib  The various methods of treatment have been discussed with the patient and family. After consideration of risks, benefits and other options for treatment, the patient has consented to  Procedure(s): ATRIAL FIBRILLATION ABLATION (N/A) as a surgical intervention .  The patient's history has been reviewed, patient examined, no change in status, stable for surgery.  I have reviewed the patient's chart and labs.  Questions were answered to the patient's satisfaction.     Hillis Range

## 2012-11-02 NOTE — Progress Notes (Signed)
Utilization Review Completed.Daune Divirgilio T4/15/2014  

## 2012-11-02 NOTE — Anesthesia Procedure Notes (Signed)
Procedure Name: Intubation Date/Time: 11/02/2012 7:53 AM Performed by: Marni Griffon Pre-anesthesia Checklist: Patient identified, Emergency Drugs available, Suction available and Patient being monitored Patient Re-evaluated:Patient Re-evaluated prior to inductionOxygen Delivery Method: Circle system utilized Preoxygenation: Pre-oxygenation with 100% oxygen Intubation Type: IV induction Ventilation: Mask ventilation without difficulty and Oral airway inserted - appropriate to patient size Laryngoscope Size: Mac and 3 Grade View: Grade II Tube type: Oral Tube size: 7.5 mm Number of attempts: 1 Airway Equipment and Method: Stylet Placement Confirmation: ETT inserted through vocal cords under direct vision,  breath sounds checked- equal and bilateral and positive ETCO2 Secured at: 21 (cm at teeth) cm Tube secured with: Tape Dental Injury: Teeth and Oropharynx as per pre-operative assessment

## 2012-11-02 NOTE — Transfer of Care (Signed)
Immediate Anesthesia Transfer of Care Note  Patient: Alison Bell  Procedure(s) Performed: Procedure(s): ATRIAL FIBRILLATION ABLATION (N/A)  Patient Location: PACU  Anesthesia Type:General  Level of Consciousness: awake and pateint uncooperative  Airway & Oxygen Therapy: Patient Spontanous Breathing and Patient connected to nasal cannula oxygen; no longer wheezing, SPO2 much better.  Post-op Assessment: Report given to PACU RN, Post -op Vital signs reviewed and stable, Patient moving all extremities and restless, trying to flex legs at hips, etc.  Post vital signs: Reviewed and stable  Complications: No apparent anesthesia complications

## 2012-11-02 NOTE — H&P (View-Only) (Signed)
 Primary Care Physician: PHARR,WALTER DAVIDSON, MD Referring Physician:  Dr Berry   Alison Bell is a 67 y.o. female with a h/o persistent atrial fibrillation who presents today for EP consultation.  She has a known history of atrial fibrillation for which she has taken sotalol.  Recently she presented with symptoms of fatigue and was found to be in afib.  She underwent attempted cardioversion by Dr Hilty which was unsuccessful.  She is adequately anticoagulated with eliquis.  Her fatigue is felt to be due to both afib and sotalol.  She is presently in sinus rhythm and reports that she feels "better".  She would like to stop sotalol long term.  Today, she denies symptoms of palpitations, chest pain, shortness of breath, orthopnea, PND, lower extremity edema, dizziness, presyncope, syncope, or neurologic sequela. The patient is tolerating medications without difficulties and is otherwise without complaint today.   Past Medical History  Diagnosis Date  . Diabetes mellitus without complication   . Persistent atrial fibrillation 08/25/2012  . HTN (hypertension) 08/25/2012  . Hyperlipidemia 08/25/2012  . Depression   . Shortness of breath   . GERD (gastroesophageal reflux disease)   . LV dysfunction, EF 45-50% due to a. fib per echo 08/27/12  08/28/2012  . Atrial enlargement, left    Past Surgical History  Procedure Laterality Date  . Tubal ligation    . Tee without cardioversion N/A 09/06/2012    Procedure: TRANSESOPHAGEAL ECHOCARDIOGRAM (TEE);  Surgeon: Kenneth C. Hilty, MD;  Location: [facility] ENDOSCOPY;  Service: Cardiovascular;  Laterality: N/A;  . Cardioversion N/A 09/06/2012    Procedure: CARDIOVERSION;  Surgeon: Kenneth C. Hilty, MD;  Location: [facility] ENDOSCOPY;  Service: Cardiovascular;  Laterality: N/A;    Current Outpatient Prescriptions  Medication Sig Dispense Refill  . amitriptyline (ELAVIL) 25 MG tablet Take 25 mg by mouth at bedtime.      . apixaban (ELIQUIS) 5 MG TABS tablet Take 1 tablet (5  mg total) by mouth 2 (two) times daily.  60 tablet  11  . aspirin EC 81 MG tablet Take 81 mg by mouth daily.      . cetirizine (ZYRTEC) 10 MG tablet Take 10 mg by mouth daily.      . famotidine (PEPCID) 20 MG tablet Take 20 mg by mouth 2 (two) times daily.      . furosemide (LASIX) 40 MG tablet Take 1 tablet (40 mg total) by mouth daily.  30 tablet  5  . insulin aspart protamine-insulin aspart (NOVOLOG 70/30) (70-30) 100 UNIT/ML injection Inject 25-50 Units into the skin 2 (two) times daily. 50 units in the morning 25 units at night      . metFORMIN (GLUCOPHAGE) 1000 MG tablet Take 1,000 mg by mouth 2 (two) times daily with a meal.      . potassium chloride SA (K-DUR,KLOR-CON) 20 MEQ tablet Take 1 tablet (20 mEq total) by mouth daily.  30 tablet  5  . Prenatal Vit-Fe Fumarate-FA (M-VIT) tablet Take 1 tablet by mouth daily.      . sertraline (ZOLOFT) 100 MG tablet Take 100 mg by mouth daily.      . simvastatin (ZOCOR) 20 MG tablet Take 20 mg by mouth daily.      . sotalol (BETAPACE) 80 MG tablet Take 1 tablet (80 mg total) by mouth every 12 (twelve) hours.  60 tablet  5  . zolpidem (AMBIEN) 10 MG tablet        No current facility-administered medications for this visit.      Allergies  Allergen Reactions  . Benicar (Olmesartan) Anaphylaxis  . Cardizem (Diltiazem Hcl) Other (See Comments)    Extreme fatique  . Other Itching and Swelling    White meat chicken  To hands.     History   Social History  . Marital Status: Married    Spouse Name: N/A    Number of Children: N/A  . Years of Education: N/A   Occupational History  . Not on file.   Social History Main Topics  . Smoking status: Former Smoker    Quit date: 07/21/1994  . Smokeless tobacco: Never Used  . Alcohol Use: No  . Drug Use: No  . Sexually Active: Yes   Other Topics Concern  . Not on file   Social History Narrative   Lives in [place] with spouse.  Retired          Family History  Problem Relation Age  of Onset  . Heart attack Mother   . Diabetes type II Father     ROS- All systems are reviewed and negative except as per the HPI above  Physical Exam: Filed Vitals:   10/20/12 1054  BP: 149/96  Pulse: 78  Height: 5' 2" (1.575 m)  Weight: 192 lb 12.8 oz (87.454 kg)    GEN- The patient is well appearing, alert and oriented x 3 today.   Head- normocephalic, atraumatic Eyes-  Sclera clear, conjunctiva pink Ears- hearing intact Oropharynx- clear Neck- supple, no JVP Lymph- no cervical lymphadenopathy Lungs- Clear to ausculation bilaterally, normal work of breathing Heart- Regular rate and rhythm, no murmurs, rubs or gallops, PMI not laterally displaced GI- soft, NT, ND, + BS Extremities- no clubbing, cyanosis, or edema MS- no significant deformity or atrophy Skin- no rash or lesion Psych- euthymic mood, full affect Neuro- strength and sensation are intact  EKG today reveals sinus rhythm 66 bpm with RBBB, frequent PACs, nonspecific ST/T changes, Qtc 507 TEE 09/06/12- EF 50-55%, mild MR, dilated LA, moderate LVH myoview reveals normal perfusion,  More than 50 pages of records were reviewed from [facility]  Assessment and Plan:  

## 2012-11-02 NOTE — Anesthesia Preprocedure Evaluation (Addendum)
Anesthesia Evaluation  Patient identified by MRN, date of birth, ID band Patient awake    Reviewed: Allergy & Precautions, H&P , NPO status , Patient's Chart, lab work & pertinent test results, reviewed documented beta blocker date and time   Airway Mallampati: I TM Distance: >3 FB Neck ROM: Full    Dental  (+) Teeth Intact and Dental Advisory Given   Pulmonary          Cardiovascular hypertension, Pt. on home beta blockers +CHF + dysrhythmias Atrial Fibrillation Rhythm:irregular Rate:Normal     Neuro/Psych    GI/Hepatic GERD-  Controlled and Medicated,  Endo/Other  diabetes, Well Controlled, Type 2, Oral Hypoglycemic Agents and Insulin Dependent  Renal/GU      Musculoskeletal   Abdominal   Peds  Hematology   Anesthesia Other Findings   Reproductive/Obstetrics                         Anesthesia Physical Anesthesia Plan  ASA: III  Anesthesia Plan: General   Post-op Pain Management:    Induction: Intravenous  Airway Management Planned: Oral ETT  Additional Equipment:   Intra-op Plan:   Post-operative Plan: Extubation in OR  Informed Consent: I have reviewed the patients History and Physical, chart, labs and discussed the procedure including the risks, benefits and alternatives for the proposed anesthesia with the patient or authorized representative who has indicated his/her understanding and acceptance.     Plan Discussed with: CRNA, Anesthesiologist and Surgeon  Anesthesia Plan Comments:         Anesthesia Quick Evaluation

## 2012-11-03 DIAGNOSIS — I4891 Unspecified atrial fibrillation: Secondary | ICD-10-CM

## 2012-11-03 LAB — GLUCOSE, CAPILLARY: Glucose-Capillary: 152 mg/dL — ABNORMAL HIGH (ref 70–99)

## 2013-01-06 ENCOUNTER — Encounter: Payer: Self-pay | Admitting: Cardiovascular Disease

## 2013-01-06 ENCOUNTER — Other Ambulatory Visit: Payer: Self-pay | Admitting: *Deleted

## 2013-01-06 ENCOUNTER — Ambulatory Visit
Admission: RE | Admit: 2013-01-06 | Discharge: 2013-01-06 | Disposition: A | Discharge status: Home / Self Care | Payer: Medicare Other | Source: Ambulatory Visit | Attending: Cardiovascular Disease | Admitting: Cardiovascular Disease

## 2013-01-06 ENCOUNTER — Ambulatory Visit (INDEPENDENT_AMBULATORY_CARE_PROVIDER_SITE_OTHER): Payer: Medicare Other | Admitting: Cardiovascular Disease

## 2013-01-06 VITALS — BP 112/78 | HR 119 | Ht 62.0 in | Wt 193.2 lb

## 2013-01-06 DIAGNOSIS — I5033 Acute on chronic diastolic (congestive) heart failure: Secondary | ICD-10-CM

## 2013-01-06 DIAGNOSIS — I4819 Other persistent atrial fibrillation: Secondary | ICD-10-CM

## 2013-01-06 DIAGNOSIS — Z79899 Other long term (current) drug therapy: Secondary | ICD-10-CM

## 2013-01-06 DIAGNOSIS — I509 Heart failure, unspecified: Secondary | ICD-10-CM

## 2013-01-06 DIAGNOSIS — D689 Coagulation defect, unspecified: Secondary | ICD-10-CM

## 2013-01-06 DIAGNOSIS — Z01810 Encounter for preprocedural cardiovascular examination: Secondary | ICD-10-CM

## 2013-01-06 DIAGNOSIS — I4891 Unspecified atrial fibrillation: Secondary | ICD-10-CM

## 2013-01-06 LAB — COMPREHENSIVE METABOLIC PANEL
ALT: 15 U/L (ref 0–35)
AST: 15 U/L (ref 0–37)
Albumin: 4.5 g/dL (ref 3.5–5.2)
Alkaline Phosphatase: 53 U/L (ref 39–117)
BUN: 20 mg/dL (ref 6–23)
CO2: 28 mEq/L (ref 19–32)
Calcium: 10.4 mg/dL (ref 8.4–10.5)
Chloride: 100 mEq/L (ref 96–112)
Creat: 1.03 mg/dL (ref 0.50–1.10)
Glucose, Bld: 72 mg/dL (ref 70–99)
Potassium: 4.6 mEq/L (ref 3.5–5.3)
Sodium: 139 mEq/L (ref 135–145)
Total Bilirubin: 0.1 mg/dL — ABNORMAL LOW (ref 0.3–1.2)
Total Protein: 8.1 g/dL (ref 6.0–8.3)

## 2013-01-06 LAB — PROTIME-INR
INR: 1.25 (ref <1.50)
Prothrombin Time: 15.6 seconds — ABNORMAL HIGH (ref 11.6–15.2)

## 2013-01-06 LAB — CBC
HCT: 35.6 % — ABNORMAL LOW (ref 36.0–46.0)
Hemoglobin: 11.7 g/dL — ABNORMAL LOW (ref 12.0–15.0)
MCH: 27.2 pg (ref 26.0–34.0)
MCHC: 32.9 g/dL (ref 30.0–36.0)
MCV: 82.8 fL (ref 78.0–100.0)
Platelets: 520 10*3/uL — ABNORMAL HIGH (ref 150–400)
RBC: 4.3 MIL/uL (ref 3.87–5.11)
RDW: 17.1 % — ABNORMAL HIGH (ref 11.5–15.5)
WBC: 11.4 10*3/uL — ABNORMAL HIGH (ref 4.0–10.5)

## 2013-01-06 LAB — APTT: aPTT: 39 seconds — ABNORMAL HIGH (ref 24–37)

## 2013-01-06 MED ORDER — DILTIAZEM HCL ER COATED BEADS 120 MG PO CP24: 120.0000 mg | ORAL_CAPSULE | Freq: Every day | ORAL | Status: DC

## 2013-01-06 NOTE — Progress Notes (Signed)
01/06/2013 Alison Bell   [birth year]  [id number]  Primary Physician Alison Moh, MD Primary Cardiologist: Alison Gess MD Alison Bell   HPI:  The patient is a 67 year old moderately overweight married Caucasian female, mother of 4, grandmother of 12 grandchildren who I saw a year ago and was seen by Alison Bell, Registered Nurse Practitioner, approximately 3 weeks ago. She has a history of paroxysmal atrial fibrillation, hypertension, hyperlipidemia, diabetes, as well as family history of heart disease. She has had a normal 2D echo except for some left atrial smoke and a Myoview stress test in the past. Because of some fatigue and some recent atrial fibrillation she underwent unsuccessful transesophageal echo-guided DC cardioversion by Dr. Rennis Bell on February 17. Her major complaints are of fatigue and she is on sotalol. Her digoxin was discontinued. She did not tolerate Xarelto and is currently on Eliquis. Over the past 2 weeks she's noticed increasing fatigue and shortness of breath. She empirically increased her furosemide from 40-80 mg a day because of progressive weight gain. Today in the office she is in a flutter with a ventricular response of 119. I spoke with Dr. Johney Bell and the consensus is to perform outpatient DC cardioversion on her tomorrow. She does not need transesophageal guided cardioversion since since she has been on Eliquis anticoagulation uninterrupted since her ablation.     Current Outpatient Prescriptions  Medication Sig Dispense Refill  . amitriptyline (ELAVIL) 25 MG tablet Take 25 mg by mouth at bedtime.      Marland Kitchen apixaban (ELIQUIS) 5 MG TABS tablet Take 1 tablet (5 mg total) by mouth 2 (two) times daily.  60 tablet  11  . cetirizine (ZYRTEC) 10 MG tablet Take 10 mg by mouth daily.      . famotidine (PEPCID) 20 MG tablet Take 20 mg by mouth 2 (two) times daily.      . furosemide (LASIX) 40 MG tablet Take 1 tablet (40 mg total) by mouth daily.  30  tablet  5  . insulin aspart protamine-insulin aspart (NOVOLOG 70/30) (70-30) 100 UNIT/ML injection Inject 25-50 Units into the skin 2 (two) times daily. 50 units in the morning 25 units at night      . metFORMIN (GLUCOPHAGE) 1000 MG tablet Take 1,000 mg by mouth 2 (two) times daily with a meal.      . Multiple Vitamin (MULTIVITAMIN WITH MINERALS) TABS Take 1 tablet by mouth daily.      . potassium chloride SA (K-DUR,KLOR-CON) 20 MEQ tablet Take 1 tablet (20 mEq total) by mouth daily.  30 tablet  5  . sertraline (ZOLOFT) 100 MG tablet Take 100 mg by mouth daily.      . simvastatin (ZOCOR) 20 MG tablet Take 20 mg by mouth daily.      Marland Kitchen zolpidem (AMBIEN) 10 MG tablet Take 10 mg by mouth at bedtime as needed for sleep.        No current facility-administered medications for this visit.    Allergies  Allergen Reactions  . Benicar (Olmesartan) Anaphylaxis  . Cardizem (Diltiazem Hcl) Other (See Comments)    Extreme fatique  . Other Itching and Swelling    White meat chicke causes hands and feet to itch and swell.    History   Social History  . Marital Status: Married    Spouse Name: N/A    Number of Children: N/A  . Years of Education: N/A   Occupational History  . Not on file.   Social History Main  Topics  . Smoking status: Former Smoker    Quit date: 07/21/1994  . Smokeless tobacco: Never Used  . Alcohol Use: No  . Drug Use: No  . Sexually Active: Yes   Other Topics Concern  . Not on file   Social History Narrative   Lives in [place] with spouse.  Retired           Review of Systems: General: negative for chills, fever, night sweats or weight changes.  Cardiovascular: negative for chest pain, dyspnea on exertion, edema, orthopnea, palpitations, paroxysmal nocturnal dyspnea or shortness of breath Dermatological: negative for rash Respiratory: negative for cough or wheezing Urologic: negative for hematuria Abdominal: negative for nausea, vomiting, diarrhea, bright  red blood per rectum, melena, or hematemesis Neurologic: negative for visual changes, syncope, or dizziness All other systems reviewed and are otherwise negative except as noted above.    Blood pressure 112/78, pulse 119, height 5\' 2"  (1.575 m), weight 193 lb 3.2 oz (87.635 kg).  General appearance: alert and no distress Neck: no adenopathy, no carotid bruit, no JVD, supple, symmetrical, trachea midline and thyroid not enlarged, symmetric, no tenderness/mass/nodules Lungs: clear to auscultation bilaterally Heart: irregularly irregular rhythm Extremities: extremities normal, atraumatic, no cyanosis or edema  EKG atrial flutter with a ventricular response of 119 and right bundle branch block  ASSESSMENT AND PLAN:   Persistent atrial fibrillation The patient underwent transesophageal guided DC cardioversion by Dr. [place] Bell February 17 back to sinus rhythm. She is currently improved after that. She underwent A. Fib ablation by Alison Bell 4/14 successfully. Over the last 2 weeks she stopped weak and short of breath. Today she is back in a flutter with a heart rate of 120. She is doubled her Lasix dose but continues to gain weight and is dyspneic. She is on Eliquis 5 mg  twice a day since her ablation. I'm going to arrange outpatient direct current cardioversion by Dr. Royann Bell tomorrow. I have spoken with Dr. Johney Bell will see her in in his office 7/14.      Alison Gess MD FACP,FACC,FAHA, [facility] 01/06/2013 11:26 AM

## 2013-01-06 NOTE — Assessment & Plan Note (Signed)
The patient underwent transesophageal guided DC cardioversion by Dr. [place] Hilty February 17 back to sinus rhythm. She is currently improved after that. She underwent A. Fib ablation by Dr. Hillis Range 4/14 successfully. Over the last 2 weeks she stopped weak and short of breath. Today she is back in a flutter with a heart rate of 120. She is doubled her Lasix dose but continues to gain weight and is dyspneic. She is on Eliquis 5 mg  twice a day since her ablation. I'm going to arrange outpatient direct current cardioversion by Dr. Royann Shivers tomorrow. I have spoken with Dr. Johney Frame will see her in in his office 7/14.

## 2013-01-06 NOTE — Patient Instructions (Addendum)
Your physician recommends that you return for lab work in: CMP, CBC, PT,PTT  Your physician has recommended that you have a Cardioversion (DCCV). Electrical Cardioversion uses a jolt of electricity to your heart either through paddles or wired patches attached to your chest. This is a controlled, usually prescheduled, procedure. Defibrillation is done under light anesthesia in the hospital, and you usually go home the day of the procedure. This is done to get your heart back into a normal rhythm. You are not awake for the procedure. Please see the instruction sheet given to you today.  A chest x-ray takes a picture of the organs and structures inside the chest, including the heart, lungs, and blood vessels. This test can show several things, including, whether the heart is enlarges; whether fluid is building up in the lungs; and whether pacemaker / defibrillator leads are still in place.  Your physician has recommended you make the following change in your medication: start Cardizem 120 mg daily

## 2013-01-07 ENCOUNTER — Ambulatory Visit (HOSPITAL_COMMUNITY)
Admission: RE | Admit: 2013-01-07 | Discharge: 2013-01-07 | Disposition: A | Discharge status: Home / Self Care | Payer: Medicare Other | Source: Ambulatory Visit | Attending: Cardiovascular Disease | Admitting: Cardiovascular Disease

## 2013-01-07 ENCOUNTER — Encounter (HOSPITAL_COMMUNITY)
Admission: RE | Disposition: A | Discharge status: Home / Self Care | Payer: Self-pay | Source: Ambulatory Visit | Attending: Cardiovascular Disease

## 2013-01-07 ENCOUNTER — Ambulatory Visit (HOSPITAL_COMMUNITY): Payer: Medicare Other | Admitting: Certified Registered Nurse Anesthetist

## 2013-01-07 ENCOUNTER — Encounter (HOSPITAL_COMMUNITY): Payer: Self-pay | Admitting: Certified Registered Nurse Anesthetist

## 2013-01-07 ENCOUNTER — Encounter (HOSPITAL_COMMUNITY): Payer: Self-pay | Admitting: *Deleted

## 2013-01-07 DIAGNOSIS — Z01810 Encounter for preprocedural cardiovascular examination: Secondary | ICD-10-CM

## 2013-01-07 DIAGNOSIS — Z7901 Long term (current) use of anticoagulants: Secondary | ICD-10-CM | POA: Insufficient documentation

## 2013-01-07 DIAGNOSIS — I4510 Unspecified right bundle-branch block: Secondary | ICD-10-CM | POA: Insufficient documentation

## 2013-01-07 DIAGNOSIS — E785 Hyperlipidemia, unspecified: Secondary | ICD-10-CM | POA: Insufficient documentation

## 2013-01-07 DIAGNOSIS — I10 Essential (primary) hypertension: Secondary | ICD-10-CM | POA: Insufficient documentation

## 2013-01-07 DIAGNOSIS — I4891 Unspecified atrial fibrillation: Secondary | ICD-10-CM

## 2013-01-07 DIAGNOSIS — E663 Overweight: Secondary | ICD-10-CM | POA: Insufficient documentation

## 2013-01-07 DIAGNOSIS — Z91018 Allergy to other foods: Secondary | ICD-10-CM | POA: Insufficient documentation

## 2013-01-07 DIAGNOSIS — Z87891 Personal history of nicotine dependence: Secondary | ICD-10-CM | POA: Insufficient documentation

## 2013-01-07 DIAGNOSIS — Z79899 Other long term (current) drug therapy: Secondary | ICD-10-CM | POA: Insufficient documentation

## 2013-01-07 DIAGNOSIS — Z794 Long term (current) use of insulin: Secondary | ICD-10-CM | POA: Insufficient documentation

## 2013-01-07 DIAGNOSIS — Z888 Allergy status to other drugs, medicaments and biological substances status: Secondary | ICD-10-CM | POA: Insufficient documentation

## 2013-01-07 DIAGNOSIS — Z8249 Family history of ischemic heart disease and other diseases of the circulatory system: Secondary | ICD-10-CM | POA: Insufficient documentation

## 2013-01-07 DIAGNOSIS — E119 Type 2 diabetes mellitus without complications: Secondary | ICD-10-CM | POA: Insufficient documentation

## 2013-01-07 HISTORY — PX: CARDIOVERSION: SHX1299

## 2013-01-07 LAB — GLUCOSE, CAPILLARY: Glucose-Capillary: 109 mg/dL — ABNORMAL HIGH (ref 70–99)

## 2013-01-07 SURGERY — CARDIOVERSION
Anesthesia: General

## 2013-01-07 MED ORDER — SODIUM CHLORIDE 0.9 % IV SOLN: INTRAVENOUS | Status: DC

## 2013-01-07 MED ORDER — SODIUM CHLORIDE 0.9 % IV SOLN
INTRAVENOUS | Status: DC | PRN
  Administered 2013-01-07: 12:00:00 via INTRAVENOUS

## 2013-01-07 MED ORDER — PROPOFOL 10 MG/ML IV BOLUS
INTRAVENOUS | Status: DC | PRN
  Administered 2013-01-07: 100 mg via INTRAVENOUS

## 2013-01-07 NOTE — Op Note (Signed)
Procedure: Electrical Cardioversion Indications:  Atrial Fibrillation  Procedure Details:  Consent: Risks of procedure as well as the alternatives and risks of each were explained to the (patient/caregiver).  Consent for procedure obtained.  Time Out: Verified patient identification, verified procedure, site/side was marked, verified correct patient position, special equipment/implants available, medications/allergies/relevent history reviewed, required imaging and test results available.  Performed  Patient placed on cardiac monitor, pulse oximetry, supplemental oxygen as necessary.  Sedation given: Propofol 100 mg IV, Dr. Kathline Magic Pacer pads placed anterior and posterior chest.  Cardioverted 1 time(s).  Cardioverted at 150J. Synchronized biphasic  Evaluation: Findings: Post procedure EKG shows: NSR, frequent PACs and brief episode of PAT Complications: None Patient did tolerate procedure well.  Time Spent Directly with the Patient:    Alison Bell 01/07/2013, 12:10 PM

## 2013-01-07 NOTE — Anesthesia Preprocedure Evaluation (Addendum)
Anesthesia Evaluation  Patient identified by MRN, date of birth, ID band Patient awake    Reviewed: Allergy & Precautions, H&P , NPO status , Patient's Chart, lab work & pertinent test results  Airway Mallampati: II TM Distance: >3 FB Neck ROM: Full    Dental  (+) Dental Advisory Given   Pulmonary shortness of breath,          Cardiovascular hypertension, Pt. on medications +CHF + dysrhythmias Atrial Fibrillation     Neuro/Psych Depression    GI/Hepatic GERD-  Medicated and Controlled,  Endo/Other  diabetes, Type 2, Oral Hypoglycemic Agents  Renal/GU      Musculoskeletal   Abdominal   Peds  Hematology   Anesthesia Other Findings   Reproductive/Obstetrics                           Anesthesia Physical Anesthesia Plan  ASA: III  Anesthesia Plan: General   Post-op Pain Management:    Induction: Intravenous  Airway Management Planned: Mask  Additional Equipment:   Intra-op Plan:   Post-operative Plan:   Informed Consent: I have reviewed the patients History and Physical, chart, labs and discussed the procedure including the risks, benefits and alternatives for the proposed anesthesia with the patient or authorized representative who has indicated his/her understanding and acceptance.   Dental advisory given  Plan Discussed with: CRNA and Surgeon  Anesthesia Plan Comments:        Anesthesia Quick Evaluation

## 2013-01-07 NOTE — Anesthesia Postprocedure Evaluation (Signed)
Anesthesia Post Note  Patient: Alison Bell  Procedure(s) Performed: Procedure(s) (LRB): CARDIOVERSION (N/A)  Anesthesia type: general  Patient location: PACU  Post pain: Pain level controlled  Post assessment: Patient's Cardiovascular Status Stable  Last Vitals:  Filed Vitals:   01/07/13 1236  BP:   Pulse:   Temp: 36.6 C  Resp:     Post vital signs: Reviewed and stable  Level of consciousness: sedated  Complications: No apparent anesthesia complications

## 2013-01-07 NOTE — Transfer of Care (Signed)
Immediate Anesthesia Transfer of Care Note  Patient: Alison Bell  Procedure(s) Performed: Procedure(s): CARDIOVERSION (N/A)  Patient Location: PACU and Endoscopy Unit  Anesthesia Type:General  Level of Consciousness: awake, alert , oriented and patient cooperative  Airway & Oxygen Therapy: Patient Spontanous Breathing and Patient connected to nasal cannula oxygen  Post-op Assessment: Report given to PACU RN, Post -op Vital signs reviewed and stable and Patient moving all extremities X 4  Post vital signs: Reviewed and stable  Complications: No apparent anesthesia complications

## 2013-01-07 NOTE — H&P (Signed)
  Date of Initial H&P: 01/06/13, Dr. Allyson Sabal  History reviewed, patient examined, no change in status, stable for surgery.  Post ablation recurrence of symptomatic AFib, here for elective synchronized cardioversion. On anticoagulation without interruption in last 30 days.  This procedure has been fully reviewed with the patient and written informed consent has been obtained.  Thurmon Fair, MD, [facility] [phone] office [phone] pager

## 2013-01-07 NOTE — Preoperative (Signed)
Beta Blockers   Reason not to administer Beta Blockers:Not Applicable 

## 2013-01-10 ENCOUNTER — Telehealth: Payer: Self-pay | Admitting: Cardiovascular Disease

## 2013-01-10 ENCOUNTER — Encounter (HOSPITAL_COMMUNITY): Payer: Self-pay | Admitting: Cardiovascular Disease

## 2013-01-10 NOTE — Telephone Encounter (Signed)
Mr.Hopkin is calling because his wife is on (Cardizem) and that makes her very fatique Dr.Berry did take her off of it and Dr.Croitoru put her back on it last Thursday before her procedure . Should she be taking this medication .Marland Kitchen   Thanks

## 2013-01-10 NOTE — Telephone Encounter (Signed)
Returned call and spoke w/ Brett Canales, pt's husband.  Stated when pt was seen last Thursday, pt was started back on diltiazem by Dr. Salena Saner.  Stated Dr. Allyson Sabal had pt on this before and it made her extremely fatigued. Chart reviewed and diltiazem restarted at visit on Thursday by Dr. Allyson Sabal and husband informed.  Denied pt has had any problems on the medicine and he doesn't want her to have any.  Husband wanted to know if pt still needs to take it since she had the cardioversion on Friday.  Husband advised to continue diltiazem as directed unless further instructions given and informed Dr. Allyson Sabal will be notified.  Husband verbalized understanding and agreed w/ plan.  Message forwarded to Dr. Allyson Sabal for review and further instructions

## 2013-01-14 NOTE — Telephone Encounter (Signed)
Continue diltiazem as directed

## 2013-01-17 NOTE — Telephone Encounter (Signed)
Returned call and informed pt per instructions by MD/PA.  Pt verbalized understanding and agreed w/ plan.  

## 2013-02-02 ENCOUNTER — Encounter: Payer: Self-pay | Admitting: Internal Medicine

## 2013-02-02 ENCOUNTER — Ambulatory Visit (INDEPENDENT_AMBULATORY_CARE_PROVIDER_SITE_OTHER): Payer: Medicare Other | Admitting: Internal Medicine

## 2013-02-02 VITALS — BP 125/69 | HR 77 | Ht 62.0 in | Wt 190.8 lb

## 2013-02-02 DIAGNOSIS — I4819 Other persistent atrial fibrillation: Secondary | ICD-10-CM

## 2013-02-02 DIAGNOSIS — I4891 Unspecified atrial fibrillation: Secondary | ICD-10-CM

## 2013-02-02 MED ORDER — APIXABAN 5 MG PO TABS: 5.0000 mg | ORAL_TABLET | Freq: Two times a day (BID) | ORAL | Status: DC

## 2013-02-02 MED ORDER — DILTIAZEM HCL ER COATED BEADS 120 MG PO CP24: 120.0000 mg | ORAL_CAPSULE | Freq: Every day | ORAL | Status: DC

## 2013-02-02 NOTE — Patient Instructions (Addendum)
Your physician wants you to follow-up in: 3 MONTHS WITH DR ALLRED You will receive a reminder letter in the mail two months in advance. If you don't receive a letter, please call our office to schedule the follow-up appointment.  

## 2013-02-02 NOTE — Progress Notes (Signed)
PCP: Londell Moh, MD Primary Cardiologist:  Dr Darden Amber P Alison Bell is a 67 y.o. female who presents today for routine electrophysiology followup.  Since her recent afib ablation, the patient reports doing very well. She had ERAF in June requiring cardioversion but no afib since.  She reports that her energy is much improved. Today, she denies symptoms of palpitations, chest pain, shortness of breath,  lower extremity edema, dizziness, presyncope, or syncope.  The patient is otherwise without complaint today.   Past Medical History  Diagnosis Date  . Diabetes mellitus without complication   . Persistent atrial fibrillation 08/25/2012  . HTN (hypertension) 08/25/2012  . Hyperlipidemia 08/25/2012  . Depression   . Shortness of breath   . GERD (gastroesophageal reflux disease)   . LV dysfunction, EF 45-50% due to a. fib per echo 08/27/12  08/28/2012  . Atrial enlargement, left    Past Surgical History  Procedure Laterality Date  . Tubal ligation    . Tee without cardioversion N/A 09/06/2012    Procedure: TRANSESOPHAGEAL ECHOCARDIOGRAM (TEE);  Surgeon: Chrystie Nose, MD;  [facility] ENDOSCOPY;  Service: Cardiovascular;  Laterality: N/A;  . Cardioversion N/A 09/06/2012    Procedure: CARDIOVERSION;  Surgeon: Chrystie Nose, MD;  Location: [facility] ENDOSCOPY;  Service: Cardiovascular;  Laterality: N/A;  . Tee without cardioversion N/A 11/01/2012    Procedure: TRANSESOPHAGEAL ECHOCARDIOGRAM (TEE);  Surgeon: Peter M Swaziland, MD;  Location: [facility] ENDOSCOPY;  Service: Cardiovascular;  Laterality: N/A;  . Cardioversion N/A 01/07/2013    Procedure: CARDIOVERSION;  Surgeon: Thurmon Fair, MD;  Location: [facility] ENDOSCOPY;  Service: Cardiovascular;  Laterality: N/A;  . Atrial fibrillation ablation  11/02/12    PVI by Dr Johney Frame    Current Outpatient Prescriptions  Medication Sig Dispense Refill  . amitriptyline (ELAVIL) 25 MG tablet Take 25 mg by mouth at bedtime.      Marland Kitchen apixaban (ELIQUIS) 5 MG TABS tablet Take  1 tablet (5 mg total) by mouth 2 (two) times daily.  60 tablet  11  . cetirizine (ZYRTEC) 10 MG tablet Take 10 mg by mouth daily.      Marland Kitchen diltiazem (CARDIZEM CD) 120 MG 24 hr capsule Take 1 capsule (120 mg total) by mouth daily.  30 capsule  6  . famotidine (PEPCID) 20 MG tablet Take 20 mg by mouth 2 (two) times daily.      . furosemide (LASIX) 40 MG tablet Take 1 tablet (40 mg total) by mouth daily.  30 tablet  5  . insulin aspart protamine-insulin aspart (NOVOLOG 70/30) (70-30) 100 UNIT/ML injection Inject 25-50 Units into the skin 2 (two) times daily. 50 units in the morning 25 units at night      . metFORMIN (GLUCOPHAGE) 1000 MG tablet Take 1,000 mg by mouth 2 (two) times daily with a meal.      . Multiple Vitamin (MULTIVITAMIN WITH MINERALS) TABS Take 1 tablet by mouth daily.      . potassium chloride SA (K-DUR,KLOR-CON) 20 MEQ tablet Take 1 tablet (20 mEq total) by mouth daily.  30 tablet  5  . sertraline (ZOLOFT) 100 MG tablet Take 100 mg by mouth daily.      . simvastatin (ZOCOR) 20 MG tablet Take 20 mg by mouth daily.      Marland Kitchen zolpidem (AMBIEN) 10 MG tablet Take 10 mg by mouth at bedtime as needed for sleep.        No current facility-administered medications for this visit.    Physical Exam: Ceasar Mons  Vitals:   02/02/13 0903  BP: 125/69  Pulse: 77  Height: 5\' 2"  (1.575 m)  Weight: 190 lb 12.8 oz (86.546 kg)    GEN- The patient is well appearing, alert and oriented x 3 today.   Head- normocephalic, atraumatic Eyes-  Sclera clear, conjunctiva pink Ears- hearing intact Oropharynx- clear Lungs- Clear to ausculation bilaterally, normal work of breathing Heart- Regular rate and rhythm, no murmurs, rubs or gallops, PMI not laterally displaced GI- soft, NT, ND, + BS Extremities- no clubbing, cyanosis, or edema  ekg today reveals sinus rhythm with PVCs, incomplete RBBB, nonspecific ST/T changes  Assessment and Plan:  1. afib Doing well sp ablation off of AAD She is just now at the  end of her 90 day healing phase Continue current medicines and monitor going forward  Return in 3 months

## 2013-03-09 ENCOUNTER — Telehealth: Payer: Self-pay | Admitting: Physician Assistant

## 2013-03-09 MED ORDER — POTASSIUM CHLORIDE CRYS ER 20 MEQ PO TBCR: 20.0000 meq | EXTENDED_RELEASE_TABLET | Freq: Every day | ORAL | Status: DC

## 2013-03-09 NOTE — Telephone Encounter (Signed)
Refill on her Potassium  #30

## 2013-05-05 ENCOUNTER — Ambulatory Visit (INDEPENDENT_AMBULATORY_CARE_PROVIDER_SITE_OTHER): Payer: Medicare Other | Admitting: Internal Medicine

## 2013-05-05 ENCOUNTER — Encounter: Payer: Self-pay | Admitting: Internal Medicine

## 2013-05-05 VITALS — BP 128/82 | HR 76 | Ht 62.0 in | Wt 192.2 lb

## 2013-05-05 DIAGNOSIS — I4891 Unspecified atrial fibrillation: Secondary | ICD-10-CM

## 2013-05-05 DIAGNOSIS — I4819 Other persistent atrial fibrillation: Secondary | ICD-10-CM

## 2013-05-05 NOTE — Patient Instructions (Signed)
Your physician recommends that you schedule a follow-up appointment in 3 months with Dr Allred    

## 2013-05-05 NOTE — Progress Notes (Signed)
PCP: Londell Moh, MD Primary Cardiologist:  Dr Darden Amber P Woodfin is a 67 y.o. female who presents today for routine electrophysiology followup.  Since her last visit, the patient reports doing very well. She has had no further afib. Today, she denies symptoms of palpitations, chest pain, shortness of breath,  lower extremity edema, dizziness, presyncope, or syncope.  The patient is otherwise without complaint today.   Past Medical History  Diagnosis Date  . Diabetes mellitus without complication   . Persistent atrial fibrillation 08/25/2012  . HTN (hypertension) 08/25/2012  . Hyperlipidemia 08/25/2012  . Depression   . Shortness of breath   . GERD (gastroesophageal reflux disease)   . LV dysfunction, EF 45-50% due to a. fib per echo 08/27/12  08/28/2012  . Atrial enlargement, left    Past Surgical History  Procedure Laterality Date  . Tubal ligation    . Tee without cardioversion N/A 09/06/2012    Procedure: TRANSESOPHAGEAL ECHOCARDIOGRAM (TEE);  Surgeon: Chrystie Nose, MD;  Location: [facility] ENDOSCOPY;  Service: Cardiovascular;  Laterality: N/A;  . Cardioversion N/A 09/06/2012    Procedure: CARDIOVERSION;  Surgeon: Chrystie Nose, MD;  Location: [facility] ENDOSCOPY;  Service: Cardiovascular;  Laterality: N/A;  . Tee without cardioversion N/A 11/01/2012    Procedure: TRANSESOPHAGEAL ECHOCARDIOGRAM (TEE);  Surgeon: Peter M Swaziland, MD;  Location: [facility] ENDOSCOPY;  Service: Cardiovascular;  Laterality: N/A;  . Cardioversion N/A 01/07/2013    Procedure: CARDIOVERSION;  Surgeon: Thurmon Fair, MD;  Location: [facility] ENDOSCOPY;  Service: Cardiovascular;  Laterality: N/A;  . Atrial fibrillation ablation  11/02/12    PVI by Dr Johney Frame    Current Outpatient Prescriptions  Medication Sig Dispense Refill  . amitriptyline (ELAVIL) 25 MG tablet Take 25 mg by mouth at bedtime.      Marland Kitchen apixaban (ELIQUIS) 5 MG TABS tablet Take 1 tablet (5 mg total) by mouth 2 (two) times daily.  180 tablet  4  . atorvastatin  (LIPITOR) 10 MG tablet Take 10 mg by mouth daily.      . cetirizine (ZYRTEC) 10 MG tablet Take 10 mg by mouth daily.      Marland Kitchen diltiazem (CARDIZEM CD) 120 MG 24 hr capsule Take 1 capsule (120 mg total) by mouth daily.  90 capsule  4  . famotidine (PEPCID) 20 MG tablet Take 20 mg by mouth 2 (two) times daily.      . furosemide (LASIX) 40 MG tablet Take 1 tablet (40 mg total) by mouth daily.  30 tablet  5  . insulin aspart protamine-insulin aspart (NOVOLOG 70/30) (70-30) 100 UNIT/ML injection Inject 25-50 Units into the skin 2 (two) times daily. 50 units in the morning 25 units at night      . metFORMIN (GLUCOPHAGE) 1000 MG tablet Take 1,000 mg by mouth 2 (two) times daily with a meal.      . Multiple Vitamin (MULTIVITAMIN WITH MINERALS) TABS Take 1 tablet by mouth daily.      Marland Kitchen NOVOLIN 70/30 RELION (70-30) 100 UNIT/ML injection Take 40 unit/ml in the morning and at night      . potassium chloride SA (K-DUR,KLOR-CON) 20 MEQ tablet Take 1 tablet (20 mEq total) by mouth daily.  30 tablet  9  . sertraline (ZOLOFT) 100 MG tablet Take 100 mg by mouth daily.      Marland Kitchen zolpidem (AMBIEN) 10 MG tablet Take 10 mg by mouth at bedtime as needed for sleep.        No current facility-administered medications for this  visit.    Physical Exam: Filed Vitals:   05/05/13 0947  BP: 128/82  Pulse: 76  Height: 5\' 2"  (1.575 m)  Weight: 192 lb 3.2 oz (87.181 kg)    GEN- The patient is well appearing, alert and oriented x 3 today.   Head- normocephalic, atraumatic Eyes-  Sclera clear, conjunctiva pink Ears- hearing intact Oropharynx- clear Lungs- Clear to ausculation bilaterally, normal work of breathing Heart- Regular rate and rhythm, no murmurs, rubs or gallops, PMI not laterally displaced GI- soft, NT, ND, + BS Extremities- no clubbing, cyanosis, or edema  ekg today reveals sinus rhythm with PVCs, incomplete RBBB, nonspecific ST/T changes  Assessment and Plan:  1. afib Doing well sp ablation off of  AAD No changes  Return in 3 months

## 2013-08-15 ENCOUNTER — Other Ambulatory Visit: Payer: Self-pay

## 2013-08-15 MED ORDER — FUROSEMIDE 40 MG PO TABS: 40.0000 mg | ORAL_TABLET | Freq: Every day | ORAL | Status: DC

## 2013-08-15 NOTE — Telephone Encounter (Signed)
Rx was sent to pharmacy electronically. 

## 2013-08-23 ENCOUNTER — Encounter: Payer: Self-pay | Admitting: Cardiology

## 2013-08-23 ENCOUNTER — Ambulatory Visit (INDEPENDENT_AMBULATORY_CARE_PROVIDER_SITE_OTHER): Payer: Medicare HMO | Admitting: Cardiology

## 2013-08-23 VITALS — BP 130/82 | HR 76 | Wt 184.0 lb

## 2013-08-23 DIAGNOSIS — I4891 Unspecified atrial fibrillation: Secondary | ICD-10-CM

## 2013-08-23 NOTE — Patient Instructions (Signed)
Your physician recommends that you schedule a follow-up appointment in: 3 months with Dr. Allred.  Your physician recommends that you continue on your current medications as directed. Please refer to the Current Medication list given to you today.    

## 2013-08-23 NOTE — Progress Notes (Signed)
[name] MRN: [id number], DOB/AGE: 70/16/1947   Date of Visit: 08/23/2013  Primary Physician: Alison Pel, MD Primary Cardiologist: Alison Burow, MD Primary EP: Alison Grayer, MD Reason for Visit: EP follow-up for atrial fibrillation  History of Present Illness  Alison Bell is a 68 y.o. female with atrial fibrillation s/p AF ablation April 2014 who presents today for routine electrophysiology followup. She was last seen by Dr Alison Bell 3 months ago at which time she was doing well, maintaining SR off Sotalol.   Since last being seen in our clinic, she reports she is doing well and has no complaints. She denies chest pain or shortness of breath. She denies palpitations, dizziness, near syncope or syncope. She denies LE swelling, orthopnea, PND or recent weight gain. She is compliant and tolerating medications without difficulty.  Past Medical History Past Medical History  Diagnosis Date  . Diabetes mellitus without complication   . Persistent atrial fibrillation 08/25/2012  . HTN (hypertension) 08/25/2012  . Hyperlipidemia 08/25/2012  . Depression   . Shortness of breath   . GERD (gastroesophageal reflux disease)   . LV dysfunction, EF 45-50% due to a. fib per echo 08/27/12  08/28/2012  . Atrial enlargement, left     Past Surgical History Past Surgical History  Procedure Laterality Date  . Tubal ligation    . Tee without cardioversion N/A 09/06/2012    Procedure: TRANSESOPHAGEAL ECHOCARDIOGRAM (TEE);  Surgeon: Alison Casino, MD;  Location: [facility] ENDOSCOPY;  Service: Cardiovascular;  Laterality: N/A;  . Cardioversion N/A 09/06/2012    Procedure: CARDIOVERSION;  Surgeon: Alison Casino, MD;  Location: [facility] ENDOSCOPY;  Service: Cardiovascular;  Laterality: N/A;  . Tee without cardioversion N/A 11/01/2012    Procedure: TRANSESOPHAGEAL ECHOCARDIOGRAM (TEE);  Surgeon: Alison M Martinique, MD;  Location: [place];  Service: Cardiovascular;  Laterality: N/A;  . Cardioversion N/A  01/07/2013    Procedure: CARDIOVERSION;  Surgeon: Alison Klein, MD;  Location: [facility] ENDOSCOPY;  Service: Cardiovascular;  Laterality: N/A;  . Atrial fibrillation ablation  11/02/12    PVI by Dr Alison Bell    Allergies/Intolerances Allergies  Allergen Reactions  . Benicar [Olmesartan] Anaphylaxis  . Cardizem [Diltiazem Hcl] Other (See Comments)    Extreme fatique  . Other Itching and Swelling    White meat chicke causes hands and feet to itch and swell.    Current Home Medications Current Outpatient Prescriptions  Medication Sig Dispense Refill  . amitriptyline (ELAVIL) 25 MG tablet Take 25 mg by mouth at bedtime.      Marland Kitchen apixaban (ELIQUIS) 5 MG TABS tablet Take 1 tablet (5 mg total) by mouth 2 (two) times daily.  180 tablet  4  . atorvastatin (LIPITOR) 10 MG tablet Take 10 mg by mouth daily.      . cetirizine (ZYRTEC) 10 MG tablet Take 10 mg by mouth daily.      Marland Kitchen diltiazem (CARDIZEM CD) 120 MG 24 hr capsule Take 1 capsule (120 mg total) by mouth daily.  90 capsule  4  . famotidine (PEPCID) 20 MG tablet Take 20 mg by mouth 2 (two) times daily.      . furosemide (LASIX) 40 MG tablet Take 1 tablet (40 mg total) by mouth daily.  30 tablet  5  . insulin aspart protamine-insulin aspart (NOVOLOG 70/30) (70-30) 100 UNIT/ML injection Inject 25-50 Units into the skin 2 (two) times daily. 50 units in the morning 25 units at night      . metFORMIN (GLUCOPHAGE) 1000  MG tablet Take 1,000 mg by mouth 2 (two) times daily with a meal.      . Multiple Vitamin (MULTIVITAMIN WITH MINERALS) TABS Take 1 tablet by mouth daily.      Marland Kitchen NOVOLIN 70/30 RELION (70-30) 100 UNIT/ML injection Take 40 unit/ml in the morning and at night      . potassium chloride SA (K-DUR,KLOR-CON) 20 MEQ tablet Take 1 tablet (20 mEq total) by mouth daily.  30 tablet  9  . sertraline (ZOLOFT) 100 MG tablet Take 100 mg by mouth daily.      Marland Kitchen zolpidem (AMBIEN) 10 MG tablet Take 10 mg by mouth at bedtime as needed for sleep.        No  current facility-administered medications for this visit.    Social History History   Social History  . Marital Status: Married    Spouse Name: N/A    Number of Children: N/A  . Years of Education: N/A   Occupational History  . Not on file.   Social History Main Topics  . Smoking status: Former Smoker    Quit date: 07/21/1994  . Smokeless tobacco: Never Used  . Alcohol Use: No  . Drug Use: No  . Sexual Activity: Yes   Other Topics Concern  . Not on file   Social History Narrative   Lives in [place] with spouse.  Retired           Review of Systems General: No chills, fever, night sweats or weight changes Cardiovascular: No chest pain, dyspnea on exertion, edema, orthopnea, palpitations, paroxysmal nocturnal dyspnea Dermatological: No rash, lesions or masses Respiratory: No cough, dyspnea Urologic: No hematuria, dysuria Abdominal: No nausea, vomiting, diarrhea, bright red blood per rectum, melena, or hematemesis Neurologic: No visual changes, weakness, changes in mental status All other systems reviewed and are otherwise negative except as noted above.  Physical Exam Vitals: Blood pressure 130/82, pulse 76, weight 184 lb (83.462 kg).  General: Well developed, well appearing 68 y.o. female in no acute distress. HEENT: Normocephalic, atraumatic. EOMs intact. Sclera nonicteric. Oropharynx clear.  Neck: Supple. No JVD. Lungs: Respirations regular and unlabored, CTA bilaterally. No wheezes, rales or rhonchi. Heart: RRR. S1, S2 present. No murmurs, rub, S3 or S4. Abdomen: Soft, non-distended.  Extremities: No clubbing, cyanosis or edema. PT/Radials 2+ and equal bilaterally. Psych: Normal affect. Neuro: Alert and oriented X 3. Moves all extremities spontaneously.   Diagnostics 12-lead ECG today - SR at 72 bpm with RBBB and one PVC; unchanged from previous ECGs  Assessment and Plan 1. Atrial fibrillation - maintaining SR off Sotalol - continue current regimen; no  changes made today - continue Eliquis for stroke prevention - return for follow-up with Dr. Rayann Bell in 3 months  Signed, Ileene Hutchinson, PA-C 08/23/2013, 11:30 AM

## 2013-10-19 ENCOUNTER — Telehealth: Payer: Self-pay | Admitting: *Deleted

## 2013-10-19 NOTE — Telephone Encounter (Signed)
Dr [name] reviewed the chart and gave clearance for cataract extraction.  Form faxed to [facility] eye

## 2013-11-07 ENCOUNTER — Telehealth: Payer: Self-pay | Admitting: *Deleted

## 2013-11-07 ENCOUNTER — Encounter: Payer: Self-pay | Admitting: Internal Medicine

## 2013-11-07 NOTE — Telephone Encounter (Signed)
Stanton Kidney, pa with dr Thayer Jew pharr's office called, she is seeing this pt today and she is having recurrent atrial fib. The pt is uncertain if she is taking sotalol but according to our last note she is not.  The pa did not want to speak to the DOD, she requested the pt be seen this week due to recurrent atrial fib and elevated heart rate. Will discuss with dr allred nurse and call the pt at home with an appt. Kelley agreed with this plan.

## 2013-11-07 NOTE — Telephone Encounter (Signed)
Spoke with pt son, dr allred can see the pt Friday this week at 4:30 PM. Today her heart rate was 126, there were no changes made in her meds. Will discuss with dr [name] tomorrow.

## 2013-11-08 ENCOUNTER — Telehealth: Payer: Self-pay | Admitting: Internal Medicine

## 2013-11-08 DIAGNOSIS — I4891 Unspecified atrial fibrillation: Secondary | ICD-10-CM

## 2013-11-08 NOTE — Telephone Encounter (Signed)
New message     Son said someone was going to check with Dr [name] regarding mom's afib and see if he was going to change her medication and call them back today

## 2013-11-08 NOTE — Telephone Encounter (Signed)
Spoke with husband and let him know I would show Dr [name] the EKG and call them tomorrow

## 2013-11-09 ENCOUNTER — Encounter: Payer: Self-pay | Admitting: Cardiovascular Disease

## 2013-11-10 ENCOUNTER — Other Ambulatory Visit (INDEPENDENT_AMBULATORY_CARE_PROVIDER_SITE_OTHER): Payer: Medicare HMO

## 2013-11-10 ENCOUNTER — Encounter: Payer: Self-pay | Admitting: Cardiovascular Disease

## 2013-11-10 ENCOUNTER — Other Ambulatory Visit: Payer: Self-pay | Admitting: *Deleted

## 2013-11-10 ENCOUNTER — Encounter: Payer: Self-pay | Admitting: *Deleted

## 2013-11-10 DIAGNOSIS — I4891 Unspecified atrial fibrillation: Secondary | ICD-10-CM

## 2013-11-10 LAB — BASIC METABOLIC PANEL
BUN: 24 mg/dL — ABNORMAL HIGH (ref 6–23)
CO2: 31 mEq/L (ref 19–32)
Calcium: 9.5 mg/dL (ref 8.4–10.5)
Chloride: 100 mEq/L (ref 96–112)
Creatinine, Ser: 0.9 mg/dL (ref 0.4–1.2)
GFR: 62.56 mL/min (ref >60.00)
Glucose, Bld: 84 mg/dL (ref 70–99)
Potassium: 4 mEq/L (ref 3.5–5.1)
Sodium: 140 mEq/L (ref 135–145)

## 2013-11-10 LAB — CBC WITH DIFFERENTIAL/PLATELET
Basophils Absolute: 0 10*3/uL (ref 0.0–0.1)
Basophils Relative: 0.2 % (ref 0.0–3.0)
Eosinophils Absolute: 0.2 10*3/uL (ref 0.0–0.7)
Eosinophils Relative: 1.9 % (ref 0.0–5.0)
HCT: 32.4 % — ABNORMAL LOW (ref 36.0–46.0)
Hemoglobin: 10.6 g/dL — ABNORMAL LOW (ref 12.0–15.0)
Lymphocytes Relative: 24.8 % (ref 12.0–46.0)
Lymphs Abs: 2.6 10*3/uL (ref 0.7–4.0)
MCHC: 32.9 g/dL (ref 30.0–36.0)
MCV: 81.3 fl (ref 78.0–100.0)
Monocytes Absolute: 0.7 10*3/uL (ref 0.1–1.0)
Monocytes Relative: 7.1 % (ref 3.0–12.0)
Neutro Abs: 6.9 10*3/uL (ref 1.4–7.7)
Neutrophils Relative %: 66 % (ref 43.0–77.0)
Platelets: 509 10*3/uL — ABNORMAL HIGH (ref 150.0–400.0)
RBC: 3.99 Mil/uL (ref 3.87–5.11)
RDW: 17.2 % — ABNORMAL HIGH (ref 11.5–14.6)
WBC: 10.4 10*3/uL (ref 4.5–10.5)

## 2013-11-10 NOTE — Telephone Encounter (Signed)
Set up for DDCV per Dr [name]  Set up for Mon at 42

## 2013-11-11 ENCOUNTER — Ambulatory Visit: Payer: Medicare HMO | Admitting: Internal Medicine

## 2013-11-14 ENCOUNTER — Encounter (HOSPITAL_COMMUNITY)
Admission: RE | Disposition: A | Discharge status: Home / Self Care | Payer: Self-pay | Source: Ambulatory Visit | Attending: Internal Medicine

## 2013-11-14 ENCOUNTER — Encounter (HOSPITAL_COMMUNITY): Payer: Self-pay | Admitting: Certified Registered Nurse Anesthetist

## 2013-11-14 ENCOUNTER — Ambulatory Visit (HOSPITAL_COMMUNITY): Payer: Medicare HMO | Admitting: Certified Registered Nurse Anesthetist

## 2013-11-14 ENCOUNTER — Encounter (HOSPITAL_COMMUNITY): Payer: Medicare HMO | Admitting: Certified Registered Nurse Anesthetist

## 2013-11-14 ENCOUNTER — Ambulatory Visit (HOSPITAL_COMMUNITY)
Admission: RE | Admit: 2013-11-14 | Discharge: 2013-11-14 | Disposition: A | Discharge status: Home / Self Care | Payer: Medicare HMO | Source: Ambulatory Visit | Attending: Internal Medicine | Admitting: Internal Medicine

## 2013-11-14 DIAGNOSIS — F329 Major depressive disorder, single episode, unspecified: Secondary | ICD-10-CM | POA: Insufficient documentation

## 2013-11-14 DIAGNOSIS — E119 Type 2 diabetes mellitus without complications: Secondary | ICD-10-CM | POA: Insufficient documentation

## 2013-11-14 DIAGNOSIS — I4891 Unspecified atrial fibrillation: Secondary | ICD-10-CM | POA: Insufficient documentation

## 2013-11-14 DIAGNOSIS — I509 Heart failure, unspecified: Secondary | ICD-10-CM | POA: Insufficient documentation

## 2013-11-14 DIAGNOSIS — Z87891 Personal history of nicotine dependence: Secondary | ICD-10-CM | POA: Insufficient documentation

## 2013-11-14 DIAGNOSIS — K219 Gastro-esophageal reflux disease without esophagitis: Secondary | ICD-10-CM | POA: Insufficient documentation

## 2013-11-14 DIAGNOSIS — I10 Essential (primary) hypertension: Secondary | ICD-10-CM | POA: Insufficient documentation

## 2013-11-14 DIAGNOSIS — F3289 Other specified depressive episodes: Secondary | ICD-10-CM | POA: Insufficient documentation

## 2013-11-14 HISTORY — PX: CARDIOVERSION: SHX1299

## 2013-11-14 LAB — GLUCOSE, CAPILLARY
Glucose-Capillary: 62 mg/dL — ABNORMAL LOW (ref 70–99)
Glucose-Capillary: 143 mg/dL — ABNORMAL HIGH (ref 70–99)
Glucose-Capillary: 86 mg/dL (ref 70–99)

## 2013-11-14 SURGERY — CARDIOVERSION
Anesthesia: Monitor Anesthesia Care

## 2013-11-14 MED ORDER — SODIUM CHLORIDE 0.9 % IV SOLN
INTRAVENOUS | Status: DC | PRN
  Administered 2013-11-14: 10:00:00 via INTRAVENOUS

## 2013-11-14 MED ORDER — PROPOFOL 10 MG/ML IV BOLUS
INTRAVENOUS | Status: DC | PRN
  Administered 2013-11-14: 70 mg via INTRAVENOUS

## 2013-11-14 MED ORDER — APIXABAN 5 MG PO TABS
5.0000 mg | ORAL_TABLET | Freq: Once | ORAL | Status: AC
  Administered 2013-11-14: 5 mg via ORAL
  Filled 2013-11-14: qty 1

## 2013-11-14 MED ORDER — DILTIAZEM HCL ER COATED BEADS 120 MG PO CP24
120.0000 mg | ORAL_CAPSULE | Freq: Every day | ORAL | Status: AC
  Administered 2013-11-14: 120 mg via ORAL
  Filled 2013-11-14: qty 1

## 2013-11-14 MED ORDER — SODIUM CHLORIDE 0.9 % IV SOLN
Freq: Once | INTRAVENOUS | Status: AC
  Administered 2013-11-14: 500 mL via INTRAVENOUS

## 2013-11-14 MED ORDER — LIDOCAINE HCL (CARDIAC) 20 MG/ML IV SOLN
INTRAVENOUS | Status: AC
  Filled 2013-11-14: qty 5

## 2013-11-14 MED ORDER — SOTALOL HCL 80 MG PO TABS: 80.0000 mg | ORAL_TABLET | Freq: Two times a day (BID) | ORAL | Status: DC

## 2013-11-14 MED ORDER — LIDOCAINE HCL (CARDIAC) 20 MG/ML IV SOLN
INTRAVENOUS | Status: DC | PRN
  Administered 2013-11-14: 50 mg via INTRAVENOUS

## 2013-11-14 MED ORDER — PROPOFOL 10 MG/ML IV BOLUS
INTRAVENOUS | Status: AC
  Filled 2013-11-14: qty 20

## 2013-11-14 NOTE — Anesthesia Preprocedure Evaluation (Addendum)
Anesthesia Evaluation  Patient identified by MRN, date of birth, ID band Patient awake    Reviewed: Allergy & Precautions, NPO status , Patient's Chart, lab work & pertinent test results, reviewed documented beta blocker date and time   Airway Mallampati: II TM Distance: >3 FB Neck ROM: Full    Dental  (+) Teeth Intact   Pulmonary former smoker,    Pulmonary exam normal       Cardiovascular hypertension, +CHF Rhythm:Irregular     Neuro/Psych Depression    GI/Hepatic GERD-  ,  Endo/Other  diabetes, Well Controlled  Renal/GU      Musculoskeletal   Abdominal Normal abdominal exam  (+)   Peds  Hematology   Anesthesia Other Findings   Reproductive/Obstetrics                         Anesthesia Physical Anesthesia Plan  ASA: III  Anesthesia Plan: MAC   Post-op Pain Management:    Induction: Intravenous  Airway Management Planned: Simple Face Mask  Additional Equipment:   Intra-op Plan:   Post-operative Plan:   Informed Consent: I have reviewed the patients History and Physical, chart, labs and discussed the procedure including the risks, benefits and alternatives for the proposed anesthesia with the patient or authorized representative who has indicated his/her understanding and acceptance.     Plan Discussed with:   Anesthesia Plan Comments:         Anesthesia Quick Evaluation

## 2013-11-14 NOTE — Transfer of Care (Signed)
Immediate Anesthesia Transfer of Care Note  Patient: Alison Bell  Procedure(s) Performed: Procedure(s): CARDIOVERSION (N/A)  Patient Location: PACU and Endoscopy Unit  Anesthesia Type:MAC  Level of Consciousness: awake, alert  and oriented  Airway & Oxygen Therapy: Patient Spontanous Breathing and Patient connected to nasal cannula oxygen  Post-op Assessment: Report given to PACU RN and Post -op Vital signs reviewed and stable  Post vital signs: Reviewed and stable  Complications: No apparent anesthesia complications

## 2013-11-14 NOTE — Discharge Instructions (Signed)
Electrical Cardioversion °Electrical cardioversion is the delivery of a jolt of electricity to change the rhythm of the heart. Sticky patches or metal paddles are placed on the chest to deliver the electricity from a device. This is done to restore a normal rhythm. A rhythm that is too fast or not regular keeps the heart from pumping well. °Electrical cardioversion is done in an emergency if:  °· There is low or no blood pressure as a result of the heart rhythm.   °· Normal rhythm must be restored as fast as possible to protect the brain and heart from further damage.   °· It may save a life. °Cardioversion may be done for heart rhythms that are not immediately life-threatening, such as atrial fibrillation or flutter, in which:  °· The heart is beating too fast or is not regular.   °· Medicine to change the rhythm has not worked.   °· It is safe to wait in order to allow time for preparation. °· Symptoms of the abnormal rhythm are bothersome. °· The risk of stroke and other serious complications can be reduced. °LET YOUR CAREGIVER KNOW ABOUT:  °· All medicines you are taking, including vitamins, herbs, eye drops, creams, and over-the-counter medicines.   °· Previous problems you or members of your family have had with the use of anesthetics.   °· Any blood disorders you have.   °· Previous surgeries you have had.   °· Medical conditions you have. °RISKS AND COMPLICATIONS  °Generally, this is a safe procedure. However, as with any procedure, complications can occur. Possible complications include:  °· Breathing problems related to the anesthetic used. °· Cardiac arrest This risk is rare. °· A blood clot that breaks free and travels to other parts of your body. This could cause a stroke or other problems. The risk of this is lowered by use of blood thinning medicine (anticoagulant) prior to the procedure. °BEFORE THE PROCEDURE  °· You may have tests to detect blood clots in your heart and evaluate heart  function.  °· You may start taking anticoagulants so your blood does not clot as easily.   °· Medicines may be given to help stabilize your heart rate and rhythm. °PROCEDURE °· You will be given medicine through an IV tube to reduce discomfort and make you sleepy (sedative).   °· An electrical shock will be delivered. °AFTER THE PROCEDURE °Your heart rhythm will be watched to make sure it does not change. You may be able to go home within a few hours.  °Document Released: 06/27/2002 Document Revised: 04/27/2013 Document Reviewed: 01/19/2013 °Exit[employer]® Patient Information ©2014 [employer]. ° °

## 2013-11-14 NOTE — H&P (Signed)
     INTERVAL PROCEDURE H&P  History and Physical Interval Note:  11/14/2013 8:35 AM  Alison Bell has presented today for their planned procedure. The various methods of treatment have been discussed with the patient and family. After consideration of risks, benefits and other options for treatment, the patient has consented to the procedure.  The patients' outpatient history has been reviewed, patient examined, and no change in status from most recent office note within the past 30 days. I have reviewed the patients' chart and labs and will proceed as planned. Questions were answered to the patient's satisfaction.   [name], MD, [facility] Attending Cardiologist [place] 11/14/2013, 8:35 AM

## 2013-11-14 NOTE — CV Procedure (Signed)
    CARDIOVERSION NOTE  Procedure: Electrical Cardioversion Indications:  Atrial Fibrillation  Procedure Details:  Consent: Risks of procedure as well as the alternatives and risks of each were explained to the (patient/caregiver).  Consent for procedure obtained.  Time Out: Verified patient identification, verified procedure, site/side was marked, verified correct patient position, special equipment/implants available, medications/allergies/relevent history reviewed, required imaging and test results available.  Performed  Patient placed on cardiac monitor, pulse oximetry, supplemental oxygen as necessary.  Sedation given: Propofol per anesthesia Pacer pads placed anterior and posterior chest.  Cardioverted 2 time(s).  Cardioverted at 150J and 200J biphasic.  Impression: Findings: Post procedure EKG shows: Atrial Fibrillation Complications: None Patient did tolerate procedure well.  Plan: 1. Unsuccessful DCCV with 2 shocks. 2. Given prior AF ablation, will likely need to restart sotalol to increase likelihood of successful cardioversion. 3.   Will need follow-up with Dr. [name].  Time Spent Directly with the Patient:  30 minutes   [name], MD, [facility] Attending Cardiologist [place] 11/14/2013, 10:13 AM

## 2013-11-14 NOTE — Progress Notes (Signed)
Alison Bell converted spontaneously to NSR about 30 minutes after failed cardioversion. I had ordered her to have her home diltiazem dose here. I discussed her case with Dr. [name] prior to conversion and he recommended that we start her on sotalol with a follow-up appointment and EKG to evaluate QTc on Thursday 4/30 at 10:00 am. I think we should continue with this plan as there is a high likelihood of reversion back to a-fib. She understands this. [place] for d/c 1-2 hours after cardizem is given.  [name], MD, [facility] Attending Cardiologist [place]

## 2013-11-14 NOTE — Anesthesia Postprocedure Evaluation (Signed)
  Anesthesia Post-op Note  Patient: Alison Bell  Procedure(s) Performed: Procedure(s): CARDIOVERSION (N/A)  Patient Location: PACU and Endoscopy Unit  Anesthesia Type:MAC  Level of Consciousness: awake  Airway and Oxygen Therapy: Patient Spontanous Breathing  Post-op Pain: mild  Post-op Assessment: Post-op Vital signs reviewed  Post-op Vital Signs: Reviewed  Last Vitals:  Filed Vitals:   11/14/13 1004  BP: 140/68  Pulse: 102  Temp:   Resp: 12    Complications: No apparent anesthesia complications

## 2013-11-15 ENCOUNTER — Encounter (HOSPITAL_COMMUNITY): Payer: Self-pay | Admitting: Internal Medicine

## 2013-11-17 ENCOUNTER — Encounter: Payer: Self-pay | Admitting: Internal Medicine

## 2013-11-17 ENCOUNTER — Ambulatory Visit (INDEPENDENT_AMBULATORY_CARE_PROVIDER_SITE_OTHER): Payer: Medicare HMO | Admitting: Internal Medicine

## 2013-11-17 VITALS — BP 102/58 | HR 52 | Ht 62.0 in | Wt 184.0 lb

## 2013-11-17 DIAGNOSIS — I4819 Other persistent atrial fibrillation: Secondary | ICD-10-CM

## 2013-11-17 DIAGNOSIS — I4891 Unspecified atrial fibrillation: Secondary | ICD-10-CM

## 2013-11-17 DIAGNOSIS — I498 Other specified cardiac arrhythmias: Secondary | ICD-10-CM

## 2013-11-17 DIAGNOSIS — R001 Bradycardia, unspecified: Secondary | ICD-10-CM | POA: Insufficient documentation

## 2013-11-17 DIAGNOSIS — I10 Essential (primary) hypertension: Secondary | ICD-10-CM

## 2013-11-17 NOTE — Patient Instructions (Signed)
Your physician recommends that you schedule a follow-up appointment in: 4-5 weeks with Dr [name]    Your physician has recommended you make the following change in your medication:  1) Stop Sotalol

## 2013-11-17 NOTE — Progress Notes (Signed)
PCP:  [name], MD  The patient presents today for routine electrophysiology followup.  Earlier this week, she underwent attempted DCCV which failed but then spontaneously converted to SR while in recovery.  She reports significant fatigue and exercise intolerance since resuming Sotalol.  She had similar symptoms when she was on Sotalol in 2014.    Today, she denies symptoms of palpitations, chest pain,  orthopnea, PND, lower extremity edema, dizziness, presyncope, syncope, or neurologic sequela.  The patient feels that she is tolerating medications without difficulties and is otherwise without complaint today.   Past Medical History  Diagnosis Date  . Diabetes mellitus without complication   . Persistent atrial fibrillation 08/25/2012  . HTN (hypertension) 08/25/2012  . Hyperlipidemia 08/25/2012  . Depression   . Shortness of breath   . GERD (gastroesophageal reflux disease)   . LV dysfunction, EF 45-50% due to a. fib per echo 08/27/12  08/28/2012  . Atrial enlargement, left    Past Surgical History  Procedure Laterality Date  . Tubal ligation    . Tee without cardioversion N/A 09/06/2012    Procedure: TRANSESOPHAGEAL ECHOCARDIOGRAM (TEE);  Surgeon: [name], MD;  Location: [facility] ENDOSCOPY;  Service: Cardiovascular;  Laterality: N/A;  . Cardioversion N/A 09/06/2012    Procedure: CARDIOVERSION;  Surgeon: [name], MD;  Location: [facility] ENDOSCOPY;  Service: Cardiovascular;  Laterality: N/A;  . Tee without cardioversion N/A 11/01/2012    Procedure: TRANSESOPHAGEAL ECHOCARDIOGRAM (TEE);  Surgeon: [name], MD;  Location: [place];  Service: Cardiovascular;  Laterality: N/A;  . Cardioversion N/A 01/07/2013    Procedure: CARDIOVERSION;  Surgeon: [name], MD;  Location: [facility] ENDOSCOPY;  Service: Cardiovascular;  Laterality: N/A;  . Atrial fibrillation ablation  11/02/12    PVI by Dr [name]  . Cardioversion N/A 11/14/2013    Procedure: CARDIOVERSION;  Surgeon:  [name], MD;  Location: [facility] ENDOSCOPY;  Service: Cardiovascular;  Laterality: N/A;    Current Outpatient Prescriptions  Medication Sig Dispense Refill  . amitriptyline (ELAVIL) 25 MG tablet Take 25 mg by mouth at bedtime.      Marland Kitchen apixaban (ELIQUIS) 5 MG TABS tablet Take 1 tablet (5 mg total) by mouth 2 (two) times daily.  180 tablet  4  . atorvastatin (LIPITOR) 10 MG tablet Take 10 mg by mouth daily.      Marland Kitchen Besifloxacin HCl (BESIVANCE) 0.6 % SUSP Place 1 drop into the left eye daily.      . cetirizine (ZYRTEC) 10 MG tablet Take 10 mg by mouth daily.      . Difluprednate (DUREZOL OP) Place 1 drop into the left eye daily.      Marland Kitchen diltiazem (CARDIZEM CD) 120 MG 24 hr capsule Take 1 capsule (120 mg total) by mouth daily.  90 capsule  4  . famotidine (PEPCID) 20 MG tablet Take 20 mg by mouth 2 (two) times daily.      . folic acid (FOLVITE) 1 MG tablet Take 1 mg by mouth daily.      . furosemide (LASIX) 40 MG tablet Take 1 tablet (40 mg total) by mouth daily.  30 tablet  5  . insulin aspart protamine-insulin aspart (NOVOLOG 70/30) (70-30) 100 UNIT/ML injection Inject 25-50 Units into the skin 2 (two) times daily. 50 units in the morning 25 units at night      . metFORMIN (GLUCOPHAGE) 1000 MG tablet Take 1,000 mg by mouth 2 (two) times daily with a meal.      .  Methotrexate Sodium, PF, 250 MG/10ML SOLN Inject 0.4 mLs as directed every 7 (seven) days. Thursdays      . Multiple Vitamin (MULTIVITAMIN WITH MINERALS) TABS Take 1 tablet by mouth daily.      . Nepafenac (ILEVRO OP) Place 1 drop into the left eye daily.      Marland Kitchen NOVOLIN 70/30 RELION (70-30) 100 UNIT/ML injection Inject 40 units in the morning and 25 units at night      . potassium chloride SA (K-DUR,KLOR-CON) 20 MEQ tablet Take 1 tablet (20 mEq total) by mouth daily.  30 tablet  9  . prednisoLONE 5 MG TABS tablet Take 5 mg by mouth daily.      . predniSONE (DELTASONE) 5 MG tablet Take 1 tablet by mouth daily.      . sertraline (ZOLOFT)  100 MG tablet Take 100 mg by mouth daily.      . traMADol (ULTRAM) 50 MG tablet Take 50 mg by mouth every 6 (six) hours as needed.       . vitamin B-12 (CYANOCOBALAMIN) 1000 MCG tablet Take 1,000 mcg by mouth daily.      Marland Kitchen zolpidem (AMBIEN) 10 MG tablet Take 10 mg by mouth at bedtime as needed for sleep.        No current facility-administered medications for this visit.    Allergies  Allergen Reactions  . Benicar [Olmesartan] Anaphylaxis  . Latex Hives  . Cardizem [Diltiazem Hcl] Other (See Comments)    Extreme fatique  . Other Itching and Swelling    White meat chicken causes hands and feet to itch and swell.    History   Social History  . Marital Status: Married    Spouse Name: N/A    Number of Children: N/A  . Years of Education: N/A   Occupational History  . Not on file.   Social History Main Topics  . Smoking status: Former Smoker    Quit date: 07/21/1994  . Smokeless tobacco: Never Used  . Alcohol Use: No  . Drug Use: No  . Sexual Activity: Yes   Other Topics Concern  . Not on file   Social History Narrative   Lives in [place] with spouse.  Retired          Family History  Problem Relation Age of Onset  . Heart attack Mother   . Diabetes type II Father     ROS-  All systems are reviewed and are negative except as outlined in the HPI above  Physical Exam: Filed Vitals:   11/17/13 1012  BP: 102/58  Pulse: 52  Height: 5\' 2"  (1.575 m)  Weight: 184 lb (83.462 kg)    GEN- The patient is well appearing, alert and oriented x 3 today.   Head- normocephalic, atraumatic Eyes-  Sclera clear, conjunctiva pink Ears- hearing intact Oropharynx- clear Neck- supple, no JVP Lymph- no cervical lymphadenopathy Lungs- Clear to ausculation bilaterally, normal work of breathing Heart- bradycardic regular rhythm, no murmurs, rubs or gallops, PMI not laterally displaced GI- soft, NT, ND, + BS Extremities- no clubbing, cyanosis, or edema MS- no significant  deformity or atrophy Skin- no rash or lesion Psych- euthymic mood, full affect Neuro- strength and sensation are intact  EKG today reveals sinus bradycardia 52 bpm, PR 166, RBBB  Assessment and Plan:  1. Persistent atrial fibrillation Recently cardioverted appropriately anticoagulated She is symptomatic with bradycardia at this time.  I will stop sotalol and monitor for recurrent afib. If she has recurrent afib off  of sotalol then we may consider tikosyn vs hybrid ablation  2. Bradycardia As above She would like to avoid pacing if possibl  3. HTN Stable No change required today  Return in 4-6 weeks

## 2013-12-11 ENCOUNTER — Other Ambulatory Visit: Payer: Self-pay | Admitting: Cardiovascular Disease

## 2013-12-11 NOTE — Telephone Encounter (Signed)
Rx was sent to pharmacy electronically. 

## 2013-12-23 ENCOUNTER — Other Ambulatory Visit: Payer: Self-pay

## 2013-12-26 ENCOUNTER — Ambulatory Visit: Payer: Medicare HMO | Admitting: Internal Medicine

## 2014-01-23 ENCOUNTER — Other Ambulatory Visit: Payer: Self-pay

## 2014-01-26 ENCOUNTER — Ambulatory Visit (INDEPENDENT_AMBULATORY_CARE_PROVIDER_SITE_OTHER): Payer: Medicare HMO | Admitting: Internal Medicine

## 2014-01-26 VITALS — BP 148/69 | HR 65 | Ht 62.0 in | Wt 186.8 lb

## 2014-01-26 DIAGNOSIS — I4891 Unspecified atrial fibrillation: Secondary | ICD-10-CM

## 2014-01-26 DIAGNOSIS — I10 Essential (primary) hypertension: Secondary | ICD-10-CM

## 2014-01-26 DIAGNOSIS — I4819 Other persistent atrial fibrillation: Secondary | ICD-10-CM

## 2014-01-26 NOTE — Progress Notes (Signed)
PCP:  [name], MD Referring physician: Dr. [name]  The patient presents today for routine electrophysiology followup.   In April, she underwent attempted DCCV which failed but then spontaneously converted to SR while in recovery. Sotalol was discontinued due to side effects. She has been unaware of any return of atrial fibrillation since April, on diltiazem for rate control.  EKG  today reveals sinus rhythm with a rare PVC. Chads2vasc score is at least 4,  is appropriately anticoagulated. No Bleeding issues . Last basic metabolic panel showed nml kidney function with a creatinine of 0.9, GFR 62.56. Previous visit, she was experiencing some bradycardia, resolved off sotalol.   Today, she denies symptoms of palpitations, chest pain,  orthopnea, PND, lower extremity edema, dizziness, presyncope, syncope, or neurologic sequela.  The patient feels that she is tolerating medications without difficulties and is otherwise without complaint today.   Past Medical History  Diagnosis Date  . Diabetes mellitus without complication   . Persistent atrial fibrillation 08/25/2012  . HTN (hypertension) 08/25/2012  . Hyperlipidemia 08/25/2012  . Depression   . Shortness of breath   . GERD (gastroesophageal reflux disease)   . LV dysfunction, EF 45-50% due to a. fib per echo 08/27/12  08/28/2012  . Atrial enlargement, left    Past Surgical History  Procedure Laterality Date  . Tubal ligation    . Tee without cardioversion N/A 09/06/2012    Procedure: TRANSESOPHAGEAL ECHOCARDIOGRAM (TEE);  Surgeon: [name], MD;  Location: [facility] ENDOSCOPY;  Service: Cardiovascular;  Laterality: N/A;  . Cardioversion N/A 09/06/2012    Procedure: CARDIOVERSION;  Surgeon: [name], MD;  Location: [facility] ENDOSCOPY;  Service: Cardiovascular;  Laterality: N/A;  . Tee without cardioversion N/A 11/01/2012    Procedure: TRANSESOPHAGEAL ECHOCARDIOGRAM (TEE);  Surgeon: [name], MD;  Location: [place];  Service:  Cardiovascular;  Laterality: N/A;  . Cardioversion N/A 01/07/2013    Procedure: CARDIOVERSION;  Surgeon: [name], MD;  Location: [facility] ENDOSCOPY;  Service: Cardiovascular;  Laterality: N/A;  . Atrial fibrillation ablation  11/02/12    PVI by Dr [name]  . Cardioversion N/A 11/14/2013    Procedure: CARDIOVERSION;  Surgeon: [name], MD;  Location: [facility] ENDOSCOPY;  Service: Cardiovascular;  Laterality: N/A;    Current Outpatient Prescriptions  Medication Sig Dispense Refill  . amitriptyline (ELAVIL) 25 MG tablet Take 25 mg by mouth at bedtime.      Marland Kitchen apixaban (ELIQUIS) 5 MG TABS tablet Take 1 tablet (5 mg total) by mouth 2 (two) times daily.  180 tablet  4  . atorvastatin (LIPITOR) 10 MG tablet Take 10 mg by mouth daily.      . cetirizine (ZYRTEC) 10 MG tablet Take 10 mg by mouth daily.      Marland Kitchen diltiazem (CARDIZEM CD) 120 MG 24 hr capsule Take 1 capsule (120 mg total) by mouth daily.  90 capsule  4  . famotidine (PEPCID) 20 MG tablet Take 20 mg by mouth 2 (two) times daily.      . folic acid (FOLVITE) 1 MG tablet Take 1 mg by mouth daily.      . furosemide (LASIX) 40 MG tablet Take 1 tablet (40 mg total) by mouth daily.  30 tablet  5  . metFORMIN (GLUCOPHAGE) 1000 MG tablet Take 1,000 mg by mouth 2 (two) times daily with a meal.      . Methotrexate Sodium, PF, 250 MG/10ML SOLN Inject 0.6 mLs as directed every 7 (seven) days. Thursdays      .  Multiple Vitamin (MULTIVITAMIN WITH MINERALS) TABS Take 1 tablet by mouth daily.      Marland Kitchen NOVOLIN 70/30 RELION (70-30) 100 UNIT/ML injection Inject 40 units in the morning and 25 units at night      . potassium chloride SA (K-DUR,KLOR-CON) 20 MEQ tablet TAKE 1 TABLET BY MOUTH EVERY DAY  30 tablet  1  . predniSONE (DELTASONE) 5 MG tablet Take 1 tablet by mouth daily.      . sertraline (ZOLOFT) 100 MG tablet Take 100 mg by mouth daily.      . traMADol (ULTRAM) 50 MG tablet Take 50 mg by mouth every 6 (six) hours as needed.       . vitamin B-12  (CYANOCOBALAMIN) 1000 MCG tablet Take 1,000 mcg by mouth daily.      Marland Kitchen zolpidem (AMBIEN) 10 MG tablet Take 10 mg by mouth at bedtime as needed for sleep.       Marland Kitchen HYDROcodone-acetaminophen (NORCO/VICODIN) 5-325 MG per tablet Take 1 tablet by mouth every 6 (six) hours as needed.       No current facility-administered medications for this visit.    Allergies  Allergen Reactions  . Benicar [Olmesartan] Anaphylaxis  . Latex Hives  . Cardizem [Diltiazem Hcl] Other (See Comments)    Extreme fatique  . Other Itching and Swelling    White meat chicken causes hands and feet to itch and swell.    History   Social History  . Marital Status: Married    Spouse Name: N/A    Number of Children: N/A  . Years of Education: N/A   Occupational History  . Not on file.   Social History Main Topics  . Smoking status: Former Smoker    Quit date: 07/21/1994  . Smokeless tobacco: Never Used  . Alcohol Use: No  . Drug Use: No  . Sexual Activity: Yes   Other Topics Concern  . Not on file   Social History Narrative   Lives in [place] with spouse.  Retired          Family History  Problem Relation Age of Onset  . Heart attack Mother   . Diabetes type II Father     ROS-  All systems are reviewed and are negative except as outlined in the HPI above  Physical Exam: Filed Vitals:   01/26/14 1330  BP: 148/69  Pulse: 65  Height: 5\' 2"  (1.575 m)  Weight: 84.732 kg (186 lb 12.8 oz)    GEN- The patient is well appearing, alert and oriented x 3 today.   Head- normocephalic, atraumatic Eyes-  Sclera clear, conjunctiva pink Ears- hearing intact Oropharynx- clear Neck- supple, no JVP Lymph- no cervical lymphadenopathy Lungs- Clear to ausculation bilaterally, normal work of breathing Heart- bradycardic regular rhythm, no murmurs, rubs or gallops, PMI not laterally displaced GI- soft, NT, ND, + BS Extremities- no clubbing, cyanosis, or edema MS- no significant deformity or  atrophy Skin- no rash or lesion Psych- euthymic mood, full affect Neuro- strength and sensation are intact  EKG today reveals sinus rhythm at 65 beats per minute with frequent premature ventricular complexes and a right bundle branch block. QTC is 455 ms  Assessment and Plan:  1.  Persistant atrial fibrillation Last cardioverted  4/15 maintaining sinus rhythm and is appropriately anticoagulated. If she has recurrent afib,  then we may consider tikosyn vs repeat catheter ablation  2. HTN Stable No change required today  Return in 3 months in the afib clinic. Repeat 2D  echo upon return to better characterize LA size.

## 2014-01-26 NOTE — Patient Instructions (Signed)
Your physician recommends that you continue on your current medications as directed. Please refer to the Current Medication list given to you today. Your physician recommends that you schedule a follow-up appointment in: [place], NP AT [place] [facility].

## 2014-02-12 ENCOUNTER — Other Ambulatory Visit: Payer: Self-pay | Admitting: Cardiovascular Disease

## 2014-02-14 NOTE — Telephone Encounter (Signed)
Rx was sent to pharmacy electronically. Patient needs

## 2014-03-15 ENCOUNTER — Other Ambulatory Visit: Payer: Self-pay | Admitting: Cardiovascular Disease

## 2014-03-15 ENCOUNTER — Other Ambulatory Visit: Payer: Self-pay | Admitting: *Deleted

## 2014-03-15 MED ORDER — POTASSIUM CHLORIDE CRYS ER 20 MEQ PO TBCR: 20.0000 meq | EXTENDED_RELEASE_TABLET | Freq: Every day | ORAL | Status: AC

## 2014-03-15 NOTE — Telephone Encounter (Signed)
Rx refill sent to patient pharmacy   

## 2014-03-20 ENCOUNTER — Telehealth: Payer: Self-pay | Admitting: Cardiovascular Disease

## 2014-03-20 NOTE — Telephone Encounter (Signed)
Pt need a new prescription for her Potassium Chloride 20 meq #90

## 2014-03-22 IMAGING — CR DG CHEST 1V PORT
1 series · 1 of 1 positions shown · non-contrast
Comparison: Portable exam 0708 hours without priors for comparison

CLINICAL DATA: Shortness of breath, CHF, atrial fibrillation

PORTABLE CHEST - 1 VIEW

[AP]
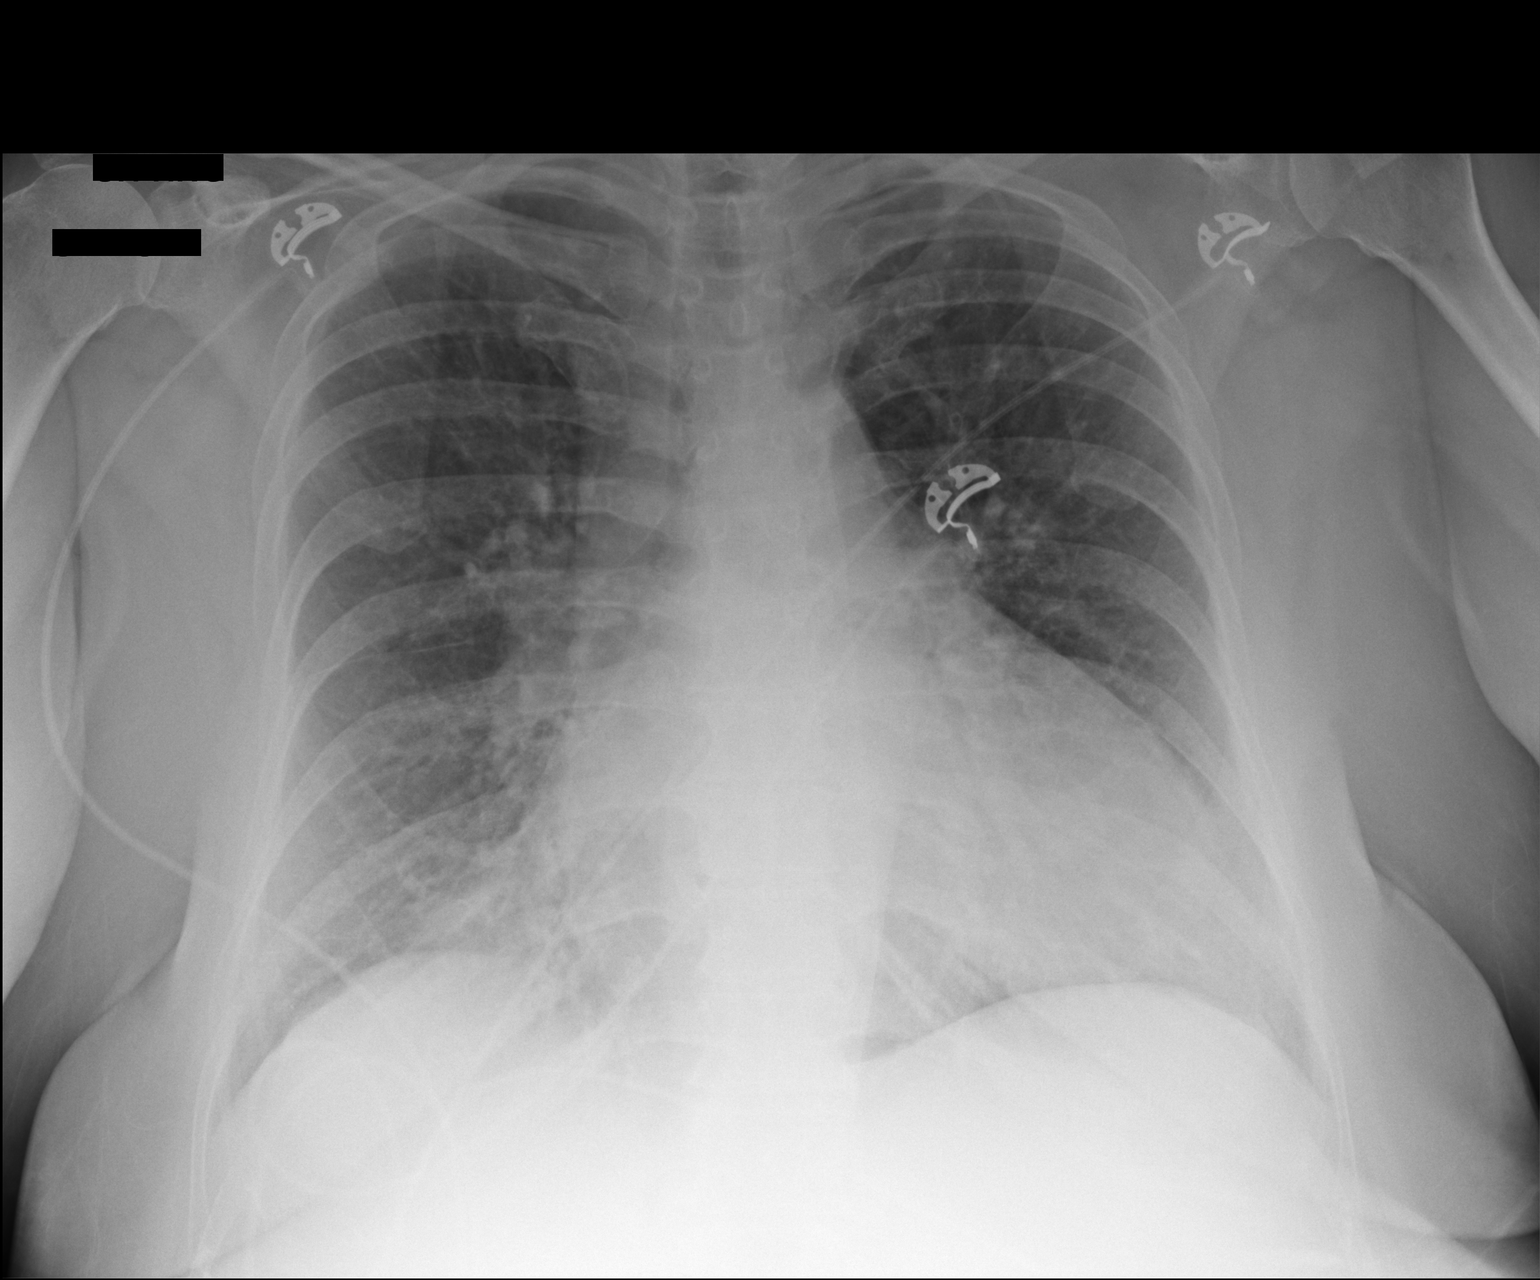

[1 of 1 positions shown; findings below may reference images not displayed]

FINDINGS: Enlargement of cardiac silhouette with pulmonary vascular
congestion.
Mildly tortuous thoracic aorta.
Peribronchial thickening and accentuation perihilar markings.
Mild atelectasis versus infiltrate at medial right lung base.
Remaining lungs grossly clear.
No pleural effusion, pneumothorax or acute osseous findings.
IMPRESSION: Enlargement of cardiac silhouette with pulmonary vascular
congestion.
Bronchitic changes with mild atelectasis versus infiltrate at
medial right lung base.

## 2014-04-26 ENCOUNTER — Other Ambulatory Visit: Payer: Self-pay | Admitting: Internal Medicine

## 2014-04-28 ENCOUNTER — Inpatient Hospital Stay (HOSPITAL_COMMUNITY): Admission: RE | Admit: 2014-04-28 | Payer: Medicare HMO | Source: Ambulatory Visit | Admitting: Nurse Practitioner

## 2014-05-01 ENCOUNTER — Emergency Department (HOSPITAL_COMMUNITY): Payer: Medicare HMO

## 2014-05-01 ENCOUNTER — Inpatient Hospital Stay (HOSPITAL_COMMUNITY)
Admission: EM | Admit: 2014-05-01 | Discharge: 2014-05-06 | DRG: 308 | Disposition: A | Discharge status: Home / Self Care | Payer: Medicare HMO | Attending: Cardiology | Admitting: Cardiology

## 2014-05-01 ENCOUNTER — Encounter (HOSPITAL_COMMUNITY): Payer: Self-pay | Admitting: Emergency Medicine

## 2014-05-01 DIAGNOSIS — I110 Hypertensive heart disease with heart failure: Secondary | ICD-10-CM | POA: Diagnosis present

## 2014-05-01 DIAGNOSIS — M79604 Pain in right leg: Secondary | ICD-10-CM

## 2014-05-01 DIAGNOSIS — F32A Depression, unspecified: Secondary | ICD-10-CM

## 2014-05-01 DIAGNOSIS — E669 Obesity, unspecified: Secondary | ICD-10-CM | POA: Diagnosis present

## 2014-05-01 DIAGNOSIS — D72829 Elevated white blood cell count, unspecified: Secondary | ICD-10-CM | POA: Diagnosis present

## 2014-05-01 DIAGNOSIS — I081 Rheumatic disorders of both mitral and tricuspid valves: Secondary | ICD-10-CM | POA: Diagnosis present

## 2014-05-01 DIAGNOSIS — I482 Chronic atrial fibrillation: Principal | ICD-10-CM | POA: Diagnosis present

## 2014-05-01 DIAGNOSIS — F329 Major depressive disorder, single episode, unspecified: Secondary | ICD-10-CM | POA: Diagnosis present

## 2014-05-01 DIAGNOSIS — W19XXXA Unspecified fall, initial encounter: Secondary | ICD-10-CM | POA: Diagnosis present

## 2014-05-01 DIAGNOSIS — E119 Type 2 diabetes mellitus without complications: Secondary | ICD-10-CM | POA: Diagnosis present

## 2014-05-01 DIAGNOSIS — I4819 Other persistent atrial fibrillation: Secondary | ICD-10-CM

## 2014-05-01 DIAGNOSIS — I10 Essential (primary) hypertension: Secondary | ICD-10-CM | POA: Diagnosis present

## 2014-05-01 DIAGNOSIS — I5033 Acute on chronic diastolic (congestive) heart failure: Secondary | ICD-10-CM | POA: Diagnosis present

## 2014-05-01 DIAGNOSIS — K219 Gastro-esophageal reflux disease without esophagitis: Secondary | ICD-10-CM | POA: Diagnosis present

## 2014-05-01 DIAGNOSIS — I428 Other cardiomyopathies: Secondary | ICD-10-CM

## 2014-05-01 DIAGNOSIS — M25559 Pain in unspecified hip: Secondary | ICD-10-CM

## 2014-05-01 DIAGNOSIS — I4891 Unspecified atrial fibrillation: Secondary | ICD-10-CM | POA: Diagnosis present

## 2014-05-01 DIAGNOSIS — D649 Anemia, unspecified: Secondary | ICD-10-CM

## 2014-05-01 DIAGNOSIS — Z6835 Body mass index (BMI) 35.0-35.9, adult: Secondary | ICD-10-CM

## 2014-05-01 DIAGNOSIS — Z87891 Personal history of nicotine dependence: Secondary | ICD-10-CM

## 2014-05-01 DIAGNOSIS — I4721 Torsades de pointes: Secondary | ICD-10-CM

## 2014-05-01 DIAGNOSIS — Z8249 Family history of ischemic heart disease and other diseases of the circulatory system: Secondary | ICD-10-CM

## 2014-05-01 DIAGNOSIS — I429 Cardiomyopathy, unspecified: Secondary | ICD-10-CM | POA: Diagnosis present

## 2014-05-01 DIAGNOSIS — I472 Ventricular tachycardia: Secondary | ICD-10-CM | POA: Diagnosis not present

## 2014-05-01 DIAGNOSIS — M25551 Pain in right hip: Secondary | ICD-10-CM | POA: Diagnosis present

## 2014-05-01 DIAGNOSIS — E785 Hyperlipidemia, unspecified: Secondary | ICD-10-CM | POA: Diagnosis present

## 2014-05-01 DIAGNOSIS — W19XXXS Unspecified fall, sequela: Secondary | ICD-10-CM

## 2014-05-01 DIAGNOSIS — Z833 Family history of diabetes mellitus: Secondary | ICD-10-CM

## 2014-05-01 HISTORY — DX: Ventricular tachycardia: I47.2

## 2014-05-01 HISTORY — DX: Torsades de pointes: I47.21

## 2014-05-01 LAB — CBC
HCT: 31.9 % — ABNORMAL LOW (ref 36.0–46.0)
Hemoglobin: 10 g/dL — ABNORMAL LOW (ref 12.0–15.0)
MCH: 25.1 pg — ABNORMAL LOW (ref 26.0–34.0)
MCHC: 31.3 g/dL (ref 30.0–36.0)
MCV: 79.9 fL (ref 78.0–100.0)
Platelets: 499 10*3/uL — ABNORMAL HIGH (ref 150–400)
RBC: 3.99 MIL/uL (ref 3.87–5.11)
RDW: 19.5 % — ABNORMAL HIGH (ref 11.5–15.5)
WBC: 17.8 10*3/uL — ABNORMAL HIGH (ref 4.0–10.5)

## 2014-05-01 LAB — BASIC METABOLIC PANEL
Anion gap: 24 — ABNORMAL HIGH (ref 5–15)
BUN: 13 mg/dL (ref 6–23)
CO2: 20 mEq/L (ref 19–32)
Calcium: 9.7 mg/dL (ref 8.4–10.5)
Chloride: 89 mEq/L — ABNORMAL LOW (ref 96–112)
Creatinine, Ser: 0.92 mg/dL (ref 0.50–1.10)
GFR calc Af Amer: 71 mL/min — ABNORMAL LOW (ref >90)
GFR calc non Af Amer: 62 mL/min — ABNORMAL LOW (ref >90)
Glucose, Bld: 217 mg/dL — ABNORMAL HIGH (ref 70–99)
Potassium: 4.1 mEq/L (ref 3.7–5.3)
Sodium: 133 mEq/L — ABNORMAL LOW (ref 137–147)

## 2014-05-01 LAB — I-STAT TROPONIN, ED: Troponin i, poc: 0 ng/mL (ref 0.00–0.08)

## 2014-05-01 LAB — TYPE AND SCREEN
ABO/RH(D): O POS
Antibody Screen: NEGATIVE

## 2014-05-01 LAB — PROTIME-INR
INR: 1.22 (ref 0.00–1.49)
Prothrombin Time: 15.4 seconds — ABNORMAL HIGH (ref 11.6–15.2)

## 2014-05-01 MED ORDER — MORPHINE SULFATE 4 MG/ML IJ SOLN
4.0000 mg | INTRAMUSCULAR | Status: DC | PRN
  Administered 2014-05-01: 4 mg via INTRAVENOUS
  Filled 2014-05-01: qty 1

## 2014-05-01 MED ORDER — MORPHINE SULFATE 4 MG/ML IJ SOLN
4.0000 mg | INTRAMUSCULAR | Status: DC | PRN
  Administered 2014-05-01 – 2014-05-02 (×4): 4 mg via INTRAVENOUS
  Filled 2014-05-01 (×5): qty 1

## 2014-05-01 MED ORDER — DILTIAZEM HCL 25 MG/5ML IV SOLN
20.0000 mg | INTRAVENOUS | Status: DC | PRN
  Administered 2014-05-01 (×2): 20 mg via INTRAVENOUS
  Filled 2014-05-01 (×2): qty 5

## 2014-05-01 MED ORDER — ONDANSETRON HCL 4 MG/2ML IJ SOLN
4.0000 mg | Freq: Once | INTRAMUSCULAR | Status: AC
  Administered 2014-05-01: 4 mg via INTRAVENOUS
  Filled 2014-05-01: qty 2

## 2014-05-01 NOTE — ED Notes (Signed)
Family at bedside. 

## 2014-05-01 NOTE — ED Notes (Signed)
Pt arrives via EMS from home. States she fell off of her bed onto hardwood floor this morning at 2a. States that she took 3 hydrocodone throughout the day (prescribed for arthritis) for rt hip pain. States that it did not help. States that she has walked on it all day with the assistance of a walker.No bruising noted. Upon EMS arrival they found pt to be afib with RVR (pt states this is chronic), HR 120-160. BP 163/90 RR 18 98% RA CBG 256. EMS gave pt 100 Fentanyl. Pain went from 10/10 to 8/10.

## 2014-05-02 ENCOUNTER — Observation Stay (HOSPITAL_COMMUNITY): Payer: Medicare HMO

## 2014-05-02 DIAGNOSIS — I10 Essential (primary) hypertension: Secondary | ICD-10-CM

## 2014-05-02 DIAGNOSIS — F329 Major depressive disorder, single episode, unspecified: Secondary | ICD-10-CM | POA: Diagnosis present

## 2014-05-02 DIAGNOSIS — Z833 Family history of diabetes mellitus: Secondary | ICD-10-CM | POA: Diagnosis not present

## 2014-05-02 DIAGNOSIS — M25551 Pain in right hip: Secondary | ICD-10-CM | POA: Diagnosis present

## 2014-05-02 DIAGNOSIS — I5033 Acute on chronic diastolic (congestive) heart failure: Secondary | ICD-10-CM

## 2014-05-02 DIAGNOSIS — E785 Hyperlipidemia, unspecified: Secondary | ICD-10-CM | POA: Diagnosis present

## 2014-05-02 DIAGNOSIS — E669 Obesity, unspecified: Secondary | ICD-10-CM | POA: Diagnosis present

## 2014-05-02 DIAGNOSIS — K219 Gastro-esophageal reflux disease without esophagitis: Secondary | ICD-10-CM | POA: Diagnosis present

## 2014-05-02 DIAGNOSIS — E119 Type 2 diabetes mellitus without complications: Secondary | ICD-10-CM

## 2014-05-02 DIAGNOSIS — I429 Cardiomyopathy, unspecified: Secondary | ICD-10-CM | POA: Diagnosis present

## 2014-05-02 DIAGNOSIS — Z6835 Body mass index (BMI) 35.0-35.9, adult: Secondary | ICD-10-CM | POA: Diagnosis not present

## 2014-05-02 DIAGNOSIS — I482 Chronic atrial fibrillation: Secondary | ICD-10-CM | POA: Diagnosis present

## 2014-05-02 DIAGNOSIS — I4891 Unspecified atrial fibrillation: Secondary | ICD-10-CM

## 2014-05-02 DIAGNOSIS — I472 Ventricular tachycardia: Secondary | ICD-10-CM | POA: Diagnosis not present

## 2014-05-02 DIAGNOSIS — Z87891 Personal history of nicotine dependence: Secondary | ICD-10-CM | POA: Diagnosis not present

## 2014-05-02 DIAGNOSIS — I481 Persistent atrial fibrillation: Secondary | ICD-10-CM

## 2014-05-02 DIAGNOSIS — D72829 Elevated white blood cell count, unspecified: Secondary | ICD-10-CM | POA: Diagnosis present

## 2014-05-02 DIAGNOSIS — I110 Hypertensive heart disease with heart failure: Secondary | ICD-10-CM | POA: Diagnosis present

## 2014-05-02 DIAGNOSIS — Z8249 Family history of ischemic heart disease and other diseases of the circulatory system: Secondary | ICD-10-CM | POA: Diagnosis not present

## 2014-05-02 DIAGNOSIS — W19XXXA Unspecified fall, initial encounter: Secondary | ICD-10-CM | POA: Diagnosis present

## 2014-05-02 DIAGNOSIS — M25559 Pain in unspecified hip: Secondary | ICD-10-CM | POA: Diagnosis present

## 2014-05-02 DIAGNOSIS — I081 Rheumatic disorders of both mitral and tricuspid valves: Secondary | ICD-10-CM | POA: Diagnosis present

## 2014-05-02 LAB — TROPONIN I
Troponin I: <0.3 ng/mL (ref <0.30)
Troponin I: <0.3 ng/mL (ref <0.30)
Troponin I: <0.3 ng/mL (ref <0.30)

## 2014-05-02 LAB — LIPID PANEL
Cholesterol: 153 mg/dL (ref 0–200)
HDL: 56 mg/dL (ref >39)
LDL Cholesterol: 74 mg/dL (ref 0–99)
Total CHOL/HDL Ratio: 2.7 RATIO
Triglycerides: 116 mg/dL (ref <150)
VLDL: 23 mg/dL (ref 0–40)

## 2014-05-02 LAB — COMPREHENSIVE METABOLIC PANEL
ALT: 17 U/L (ref 0–35)
AST: 17 U/L (ref 0–37)
Albumin: 3.6 g/dL (ref 3.5–5.2)
Alkaline Phosphatase: 67 U/L (ref 39–117)
Anion gap: 15 (ref 5–15)
BUN: 18 mg/dL (ref 6–23)
CO2: 27 mEq/L (ref 19–32)
Calcium: 9.5 mg/dL (ref 8.4–10.5)
Chloride: 92 mEq/L — ABNORMAL LOW (ref 96–112)
Creatinine, Ser: 1.13 mg/dL — ABNORMAL HIGH (ref 0.50–1.10)
GFR calc Af Amer: 56 mL/min — ABNORMAL LOW (ref >90)
GFR calc non Af Amer: 48 mL/min — ABNORMAL LOW (ref >90)
Glucose, Bld: 213 mg/dL — ABNORMAL HIGH (ref 70–99)
Potassium: 4.2 mEq/L (ref 3.7–5.3)
Sodium: 134 mEq/L — ABNORMAL LOW (ref 137–147)
Total Bilirubin: 0.5 mg/dL (ref 0.3–1.2)
Total Protein: 7.9 g/dL (ref 6.0–8.3)

## 2014-05-02 LAB — URINALYSIS, ROUTINE W REFLEX MICROSCOPIC
Bilirubin Urine: NEGATIVE
Glucose, UA: NEGATIVE mg/dL
Hgb urine dipstick: NEGATIVE
Ketones, ur: NEGATIVE mg/dL
Leukocytes, UA: NEGATIVE
Nitrite: NEGATIVE
Protein, ur: NEGATIVE mg/dL
Specific Gravity, Urine: 1.018 (ref 1.005–1.030)
Urobilinogen, UA: 0.2 mg/dL (ref 0.0–1.0)
pH: 5 (ref 5.0–8.0)

## 2014-05-02 LAB — BASIC METABOLIC PANEL
Anion gap: 18 — ABNORMAL HIGH (ref 5–15)
BUN: 14 mg/dL (ref 6–23)
CO2: 24 mEq/L (ref 19–32)
Calcium: 9.3 mg/dL (ref 8.4–10.5)
Chloride: 90 mEq/L — ABNORMAL LOW (ref 96–112)
Creatinine, Ser: 0.94 mg/dL (ref 0.50–1.10)
GFR calc Af Amer: 70 mL/min — ABNORMAL LOW (ref >90)
GFR calc non Af Amer: 60 mL/min — ABNORMAL LOW (ref >90)
Glucose, Bld: 221 mg/dL — ABNORMAL HIGH (ref 70–99)
Potassium: 4.1 mEq/L (ref 3.7–5.3)
Sodium: 132 mEq/L — ABNORMAL LOW (ref 137–147)

## 2014-05-02 LAB — CBC
HCT: 30.2 % — ABNORMAL LOW (ref 36.0–46.0)
Hemoglobin: 9.5 g/dL — ABNORMAL LOW (ref 12.0–15.0)
MCH: 25.1 pg — ABNORMAL LOW (ref 26.0–34.0)
MCHC: 31.5 g/dL (ref 30.0–36.0)
MCV: 79.9 fL (ref 78.0–100.0)
Platelets: 454 10*3/uL — ABNORMAL HIGH (ref 150–400)
RBC: 3.78 MIL/uL — ABNORMAL LOW (ref 3.87–5.11)
RDW: 19.3 % — ABNORMAL HIGH (ref 11.5–15.5)
WBC: 15.4 10*3/uL — ABNORMAL HIGH (ref 4.0–10.5)

## 2014-05-02 LAB — CBC WITH DIFFERENTIAL/PLATELET
Basophils Absolute: 0 10*3/uL (ref 0.0–0.1)
Basophils Relative: 0 % (ref 0–1)
Eosinophils Absolute: 0.1 10*3/uL (ref 0.0–0.7)
Eosinophils Relative: 1 % (ref 0–5)
HCT: 28.6 % — ABNORMAL LOW (ref 36.0–46.0)
Hemoglobin: 9 g/dL — ABNORMAL LOW (ref 12.0–15.0)
Lymphocytes Relative: 9 % — ABNORMAL LOW (ref 12–46)
Lymphs Abs: 1.1 10*3/uL (ref 0.7–4.0)
MCH: 25.6 pg — ABNORMAL LOW (ref 26.0–34.0)
MCHC: 31.5 g/dL (ref 30.0–36.0)
MCV: 81.5 fL (ref 78.0–100.0)
Monocytes Absolute: 1.1 10*3/uL — ABNORMAL HIGH (ref 0.1–1.0)
Monocytes Relative: 9 % (ref 3–12)
Neutro Abs: 9.4 10*3/uL — ABNORMAL HIGH (ref 1.7–7.7)
Neutrophils Relative %: 81 % — ABNORMAL HIGH (ref 43–77)
Platelets: 455 10*3/uL — ABNORMAL HIGH (ref 150–400)
RBC: 3.51 MIL/uL — ABNORMAL LOW (ref 3.87–5.11)
RDW: 20.1 % — ABNORMAL HIGH (ref 11.5–15.5)
WBC: 11.7 10*3/uL — ABNORMAL HIGH (ref 4.0–10.5)

## 2014-05-02 LAB — CBG MONITORING, ED
Glucose-Capillary: 221 mg/dL — ABNORMAL HIGH (ref 70–99)
Glucose-Capillary: 183 mg/dL — ABNORMAL HIGH (ref 70–99)
Glucose-Capillary: 220 mg/dL — ABNORMAL HIGH (ref 70–99)

## 2014-05-02 LAB — MAGNESIUM: Magnesium: 1.7 mg/dL (ref 1.5–2.5)

## 2014-05-02 LAB — MRSA PCR SCREENING: MRSA by PCR: NEGATIVE

## 2014-05-02 LAB — ABO/RH: ABO/RH(D): O POS

## 2014-05-02 LAB — GLUCOSE, CAPILLARY: Glucose-Capillary: 224 mg/dL — ABNORMAL HIGH (ref 70–99)

## 2014-05-02 LAB — TSH: TSH: 1.95 u[IU]/mL (ref 0.350–4.500)

## 2014-05-02 MED ORDER — ZOLPIDEM TARTRATE 5 MG PO TABS: 10.0000 mg | ORAL_TABLET | Freq: Every day | ORAL | Status: DC

## 2014-05-02 MED ORDER — ONDANSETRON HCL 4 MG/2ML IJ SOLN: 4.0000 mg | Freq: Four times a day (QID) | INTRAMUSCULAR | Status: DC | PRN

## 2014-05-02 MED ORDER — APIXABAN 5 MG PO TABS
5.0000 mg | ORAL_TABLET | Freq: Two times a day (BID) | ORAL | Status: DC
  Administered 2014-05-02 – 2014-05-06 (×9): 5 mg via ORAL
  Filled 2014-05-02 (×11): qty 1

## 2014-05-02 MED ORDER — FUROSEMIDE 10 MG/ML IJ SOLN
40.0000 mg | Freq: Once | INTRAMUSCULAR | Status: AC
  Administered 2014-05-02: 40 mg via INTRAVENOUS
  Filled 2014-05-02: qty 4

## 2014-05-02 MED ORDER — ADULT MULTIVITAMIN W/MINERALS CH
1.0000 | ORAL_TABLET | Freq: Every day | ORAL | Status: DC
  Administered 2014-05-02 – 2014-05-06 (×5): 1 via ORAL
  Filled 2014-05-02 (×5): qty 1

## 2014-05-02 MED ORDER — HYDROMORPHONE HCL 1 MG/ML IJ SOLN
INTRAMUSCULAR | Status: AC
  Administered 2014-05-02: 1 mg via INTRAVENOUS
  Filled 2014-05-02: qty 1

## 2014-05-02 MED ORDER — HYDROMORPHONE HCL 1 MG/ML IJ SOLN
1.0000 mg | INTRAMUSCULAR | Status: DC | PRN
  Administered 2014-05-02 (×2): 1 mg via INTRAVENOUS
  Filled 2014-05-02: qty 1

## 2014-05-02 MED ORDER — SERTRALINE HCL 100 MG PO TABS
100.0000 mg | ORAL_TABLET | Freq: Every day | ORAL | Status: DC
  Administered 2014-05-02 – 2014-05-04 (×3): 100 mg via ORAL
  Filled 2014-05-02 (×4): qty 1
  Filled 2014-05-02: qty 2

## 2014-05-02 MED ORDER — DILTIAZEM HCL 100 MG IV SOLR
5.0000 mg/h | INTRAVENOUS | Status: DC
  Administered 2014-05-02: 5 mg/h via INTRAVENOUS
  Administered 2014-05-02 – 2014-05-03 (×4): 15 mg/h via INTRAVENOUS
  Administered 2014-05-03 – 2014-05-04 (×4): 10 mg/h via INTRAVENOUS
  Filled 2014-05-02: qty 100

## 2014-05-02 MED ORDER — AMITRIPTYLINE HCL 25 MG PO TABS
25.0000 mg | ORAL_TABLET | Freq: Every day | ORAL | Status: DC
  Administered 2014-05-02: 25 mg via ORAL
  Filled 2014-05-02 (×2): qty 1

## 2014-05-02 MED ORDER — FOLIC ACID 1 MG PO TABS
1.0000 mg | ORAL_TABLET | Freq: Every day | ORAL | Status: DC
  Administered 2014-05-02 – 2014-05-06 (×5): 1 mg via ORAL
  Filled 2014-05-02 (×5): qty 1

## 2014-05-02 MED ORDER — LORATADINE 10 MG PO TABS
10.0000 mg | ORAL_TABLET | Freq: Every day | ORAL | Status: DC
  Administered 2014-05-02 – 2014-05-06 (×5): 10 mg via ORAL
  Filled 2014-05-02 (×5): qty 1

## 2014-05-02 MED ORDER — METOPROLOL TARTRATE 1 MG/ML IV SOLN: 2.5000 mg | INTRAVENOUS | Status: DC | PRN

## 2014-05-02 MED ORDER — FUROSEMIDE 40 MG PO TABS
40.0000 mg | ORAL_TABLET | Freq: Every day | ORAL | Status: DC
  Administered 2014-05-02 – 2014-05-06 (×5): 40 mg via ORAL
  Filled 2014-05-02 (×4): qty 1
  Filled 2014-05-02: qty 2

## 2014-05-02 MED ORDER — ZOLPIDEM TARTRATE 5 MG PO TABS: 5.0000 mg | ORAL_TABLET | Freq: Every evening | ORAL | Status: DC | PRN

## 2014-05-02 MED ORDER — FAMOTIDINE 20 MG PO TABS
20.0000 mg | ORAL_TABLET | Freq: Two times a day (BID) | ORAL | Status: DC
  Administered 2014-05-02 – 2014-05-06 (×9): 20 mg via ORAL
  Filled 2014-05-02 (×10): qty 1

## 2014-05-02 MED ORDER — SODIUM CHLORIDE 0.9 % IJ SOLN: 3.0000 mL | INTRAMUSCULAR | Status: DC | PRN

## 2014-05-02 MED ORDER — ONDANSETRON HCL 4 MG PO TABS: 4.0000 mg | ORAL_TABLET | Freq: Four times a day (QID) | ORAL | Status: DC | PRN

## 2014-05-02 MED ORDER — POTASSIUM CHLORIDE CRYS ER 20 MEQ PO TBCR
20.0000 meq | EXTENDED_RELEASE_TABLET | Freq: Every day | ORAL | Status: DC
  Administered 2014-05-02 – 2014-05-06 (×5): 20 meq via ORAL
  Filled 2014-05-02 (×7): qty 1

## 2014-05-02 MED ORDER — ATORVASTATIN CALCIUM 10 MG PO TABS
10.0000 mg | ORAL_TABLET | Freq: Every day | ORAL | Status: DC
  Administered 2014-05-02 – 2014-05-06 (×5): 10 mg via ORAL
  Filled 2014-05-02 (×5): qty 1

## 2014-05-02 MED ORDER — IBUPROFEN 600 MG PO TABS
600.0000 mg | ORAL_TABLET | Freq: Three times a day (TID) | ORAL | Status: DC
  Administered 2014-05-02 – 2014-05-06 (×11): 600 mg via ORAL
  Filled 2014-05-02 (×14): qty 1

## 2014-05-02 MED ORDER — SODIUM CHLORIDE 0.9 % IJ SOLN
3.0000 mL | Freq: Two times a day (BID) | INTRAMUSCULAR | Status: DC
  Administered 2014-05-02: 3 mL via INTRAVENOUS

## 2014-05-02 MED ORDER — ACETAMINOPHEN 325 MG PO TABS
650.0000 mg | ORAL_TABLET | Freq: Four times a day (QID) | ORAL | Status: DC | PRN
  Administered 2014-05-02: 650 mg via ORAL
  Filled 2014-05-02: qty 2

## 2014-05-02 MED ORDER — INSULIN ASPART 100 UNIT/ML ~~LOC~~ SOLN
0.0000 [IU] | Freq: Three times a day (TID) | SUBCUTANEOUS | Status: DC
  Administered 2014-05-02: 3 [IU] via SUBCUTANEOUS
  Administered 2014-05-02: 2 [IU] via SUBCUTANEOUS
  Administered 2014-05-02 – 2014-05-03 (×3): 3 [IU] via SUBCUTANEOUS
  Administered 2014-05-03: 7 [IU] via SUBCUTANEOUS
  Administered 2014-05-04: 2 [IU] via SUBCUTANEOUS
  Administered 2014-05-04: 5 [IU] via SUBCUTANEOUS
  Administered 2014-05-05: 3 [IU] via SUBCUTANEOUS
  Administered 2014-05-05: 5 [IU] via SUBCUTANEOUS
  Administered 2014-05-06 (×2): 2 [IU] via SUBCUTANEOUS
  Filled 2014-05-02 (×2): qty 1

## 2014-05-02 MED ORDER — MORPHINE SULFATE 2 MG/ML IJ SOLN: 2.0000 mg | INTRAMUSCULAR | Status: DC | PRN

## 2014-05-02 MED ORDER — ACETAMINOPHEN 650 MG RE SUPP: 650.0000 mg | Freq: Four times a day (QID) | RECTAL | Status: DC | PRN

## 2014-05-02 MED ORDER — METOPROLOL TARTRATE 1 MG/ML IV SOLN: 5.0000 mg | INTRAVENOUS | Status: DC | PRN

## 2014-05-02 MED ORDER — PREDNISONE 2.5 MG PO TABS
2.5000 mg | ORAL_TABLET | Freq: Every day | ORAL | Status: DC
  Administered 2014-05-02 – 2014-05-06 (×5): 2.5 mg via ORAL
  Filled 2014-05-02 (×5): qty 1

## 2014-05-02 MED ORDER — INFLUENZA VAC SPLIT QUAD 0.5 ML IM SUSY
0.5000 mL | PREFILLED_SYRINGE | INTRAMUSCULAR | Status: AC
  Administered 2014-05-03: 0.5 mL via INTRAMUSCULAR
  Filled 2014-05-02: qty 0.5

## 2014-05-02 MED ORDER — SODIUM CHLORIDE 0.9 % IV SOLN: 250.0000 mL | INTRAVENOUS | Status: DC | PRN

## 2014-05-02 MED ORDER — OXYCODONE HCL 5 MG PO TABS
5.0000 mg | ORAL_TABLET | ORAL | Status: DC | PRN
  Administered 2014-05-02: 5 mg via ORAL
  Filled 2014-05-02: qty 1

## 2014-05-02 NOTE — H&P (Signed)
[facility]                                                                                    Patient Demographics  Alison Bell, is a 68 y.o. female  CSN: [id number]  MRN: [id number]  DOB - [birth year]  Admit Date - 05/01/2014  Outpatient Primary MD for the patient is [name], MD   With History of -  Past Medical History  Diagnosis Date  . Diabetes mellitus without complication   . Persistent atrial fibrillation 08/25/2012  . HTN (hypertension) 08/25/2012  . Hyperlipidemia 08/25/2012  . Depression   . Shortness of breath   . GERD (gastroesophageal reflux disease)   . LV dysfunction, EF 45-50% due to a. fib per echo 08/27/12  08/28/2012  . Atrial enlargement, left       Past Surgical History  Procedure Laterality Date  . Tubal ligation    . Tee without cardioversion N/A 09/06/2012    Procedure: TRANSESOPHAGEAL ECHOCARDIOGRAM (TEE);  Surgeon: [name], MD;  Location: [facility] ENDOSCOPY;  Service: Cardiovascular;  Laterality: N/A;  . Cardioversion N/A 09/06/2012    Procedure: CARDIOVERSION;  Surgeon: [name], MD;  Location: [facility] ENDOSCOPY;  Service: Cardiovascular;  Laterality: N/A;  . Tee without cardioversion N/A 11/01/2012    Procedure: TRANSESOPHAGEAL ECHOCARDIOGRAM (TEE);  Surgeon: [name], MD;  Location: [place];  Service: Cardiovascular;  Laterality: N/A;  . Cardioversion N/A 01/07/2013    Procedure: CARDIOVERSION;  Surgeon: [name], MD;  Location: [facility] ENDOSCOPY;  Service: Cardiovascular;  Laterality: N/A;  . Atrial fibrillation ablation  11/02/12    PVI by Dr [name]  . Cardioversion N/A 11/14/2013    Procedure: CARDIOVERSION;  Surgeon: [name], MD;  Location: [facility] ENDOSCOPY;  Service: Cardiovascular;  Laterality: N/A;    in for   Chief Complaint  Patient presents with  . Fall  . Atrial Fibrillation     HPI  Alison Bell  is a 68 y.o. female, with past medical history significant for atrial fibrillation status  post 2 failed attempts of cardioversion, presenting after a fall with right hip pain. No preceding chest pain or headache, she just slipped. In the emergency room her heart rate was in the 150s-160s Patient denies any chest pains but reports shortness of breath now . Patient received 2 doses of Cardizem IV 10 mg and her heart rate continued to be in the 120s to 130s probably contributed to by pain. No nausea vomiting, no fever or chills. Her right hip workup including an x-ray and CT were both negative, MRI of the hip is still pending .    Review of Systems    In addition to the HPI above,  No Fever-chills, No Headache, No changes with Vision or hearing, No problems swallowing food or Liquids, No Chest pain, Cough  No Abdominal pain, No Nausea or Vommitting, Bowel movements are regular, No Blood in stool or Urine, No dysuria, No new skin rashes or bruises, No new weakness, tingling, numbness in any extremity, No recent weight gain or loss, No polyuria, polydypsia or polyphagia, No significant Mental Stressors.  A full 10 point Review of Systems  was done, except as stated above, all other Review of Systems were negative.   Social History History  Substance Use Topics  . Smoking status: Former Smoker    Quit date: 07/21/1994  . Smokeless tobacco: Never Used  . Alcohol Use: No     Family History Family History  Problem Relation Age of Onset  . Heart attack Mother   . Diabetes type II Father      Prior to Admission medications   Medication Sig Start Date End Date Taking? Authorizing Provider  amitriptyline (ELAVIL) 25 MG tablet Take 25 mg by mouth at bedtime.   Yes Historical Provider, MD  apixaban (ELIQUIS) 5 MG TABS tablet Take 1 tablet (5 mg total) by mouth 2 (two) times daily. 02/02/13  Yes [name], MD  atorvastatin (LIPITOR) 10 MG tablet Take 10 mg by mouth daily. 04/24/13  Yes Historical Provider, MD  cetirizine (ZYRTEC) 10 MG tablet Take 10 mg by mouth daily.   Yes  Historical Provider, MD  CHOLECALCIFEROL PO Take 1 tablet by mouth daily.   Yes Historical Provider, MD  diltiazem (CARTIA XT) 120 MG 24 hr capsule Take 120 mg by mouth daily.   Yes Historical Provider, MD  famotidine (PEPCID) 20 MG tablet Take 20 mg by mouth 2 (two) times daily.   Yes Historical Provider, MD  folic acid (FOLVITE) 1 MG tablet Take 1 mg by mouth daily.   Yes Historical Provider, MD  furosemide (LASIX) 40 MG tablet Take 40 mg by mouth daily.   Yes Historical Provider, MD  HYDROcodone-acetaminophen (NORCO/VICODIN) 5-325 MG per tablet Take 1 tablet by mouth every 6 (six) hours as needed. 12/26/13  Yes Historical Provider, MD  metFORMIN (GLUCOPHAGE) 1000 MG tablet Take 1,000 mg by mouth 2 (two) times daily with a meal.   Yes Historical Provider, MD  Methotrexate Sodium, PF, 250 MG/10ML SOLN Inject 0.6 mLs as directed every 7 (seven) days. Thursdays   Yes Historical Provider, MD  Multiple Vitamin (MULTIVITAMIN WITH MINERALS) TABS Take 1 tablet by mouth daily.   Yes Historical Provider, MD  NOVOLIN 70/30 RELION (70-30) 100 UNIT/ML injection Inject 40 units in the morning and 25 units at night 09/13/13  Yes Historical Provider, MD  potassium chloride SA (K-DUR,KLOR-CON) 20 MEQ tablet Take 1 tablet (20 mEq total) by mouth daily. 03/15/14  Yes [name], MD  predniSONE (DELTASONE) 5 MG tablet Take 2.5 mg by mouth daily.  11/01/13  Yes Historical Provider, MD  sertraline (ZOLOFT) 100 MG tablet Take 100 mg by mouth daily.   Yes Historical Provider, MD  zolpidem (AMBIEN) 10 MG tablet Take 10 mg by mouth at bedtime.  10/12/12  Yes Historical Provider, MD    Allergies  Allergen Reactions  . Benicar [Olmesartan] Anaphylaxis  . Other Itching and Swelling    White meat chicken causes hands and feet to itch and swell.  . Latex Hives    Physical Exam  Vitals  Blood pressure 126/98, pulse 118, temperature 97.7 F (36.5 C), temperature source Oral, resp. rate 15, height 5\' 2"  (1.575 m),  weight 88.451 kg (195 lb), SpO2 93.00%.   1. General elderly white female in pain  2. Normal affect and insight, Not Suicidal or Homicidal, Awake Alert, Oriented X 3.  3. No F.N deficits grossly, ALL C.Nerves Intact,  4. Ears and Eyes appear Normal, Conjunctivae clear, PERRLA. Moist Oral Mucosa.  5. Supple Neck, No JVD, No cervical lymphadenopathy appriciated, No Carotid Bruits.  6. Symmetrical Chest wall movement,  scattered rhonchi and crackles.  7. irregularly irregular with tachycardia, No Parasternal Heave.  8. Positive Bowel Sounds, Abdomen Soft, Non tender, No organomegaly appriciated,No rebound -guarding or rigidity.  9.  No Cyanosis, Normal Skin Turgor, No Skin Rash or Bruise.  10. Good muscle tone,  joints appear normal , no effusions, right hip range of motion abnormal due to pain.      Data Review  CBC  Recent Labs Lab 05/01/14 2144  WBC 17.8*  HGB 10.0*  HCT 31.9*  PLT 499*  MCV 79.9  MCH 25.1*  MCHC 31.3  RDW 19.5*   ------------------------------------------------------------------------------------------------------------------  Chemistries   Recent Labs Lab 05/01/14 2144  NA 133*  K 4.1  CL 89*  CO2 20  GLUCOSE 217*  BUN 13  CREATININE 0.92  CALCIUM 9.7   ------------------------------------------------------------------------------------------------------------------ estimated creatinine clearance is 58.8 ml/min (by C-G formula based on Cr of 0.92). ------------------------------------------------------------------------------------------------------------------ No results found for this basename: TSH, T4TOTAL, FREET3, T3FREE, THYROIDAB,  in the last 72 hours   Coagulation profile  Recent Labs Lab 05/01/14 2144  INR 1.22   ------------------------------------------------------------------------------------------------------------------- No results found for this basename: DDIMER,  in the last 72  hours -------------------------------------------------------------------------------------------------------------------  Cardiac Enzymes No results found for this basename: CK, CKMB, TROPONINI, MYOGLOBIN,  in the last 168 hours ------------------------------------------------------------------------------------------------------------------ No components found with this basename: POCBNP,    ---------------------------------------------------------------------------------------------------------------  Urinalysis    Component Value Date/Time   COLORURINE YELLOW 05/02/2014 0030   APPEARANCEUR CLEAR 05/02/2014 0030   LABSPEC 1.018 05/02/2014 0030   PHURINE 5.0 05/02/2014 0030   GLUCOSEU NEGATIVE 05/02/2014 0030   HGBUR NEGATIVE 05/02/2014 0030   BILIRUBINUR NEGATIVE 05/02/2014 0030   KETONESUR NEGATIVE 05/02/2014 0030   PROTEINUR NEGATIVE 05/02/2014 0030   UROBILINOGEN 0.2 05/02/2014 0030   NITRITE NEGATIVE 05/02/2014 0030   LEUKOCYTESUR NEGATIVE 05/02/2014 0030    ----------------------------------------------------------------------------------------------------------------   Imaging results:   Dg Chest 1 View  05/01/2014   CLINICAL DATA:  Post fall.  Initial encounter.  EXAM: CHEST - 1 VIEW  COMPARISON:  01/06/2013; 08/25/2012  FINDINGS: Grossly unchanged enlarged cardiac silhouette and mediastinal contours. Bilateral medial basilar heterogeneous opacities are unchanged, right greater than left. Mild pulmonary venous congestion without frank evidence of edema. No pleural effusion pneumothorax. No acute osseus abnormalities.  IMPRESSION: 1. Cardiomegaly and pulmonary venous congestion without definite acute cardiopulmonary disease. 2. Grossly unchanged bilateral medial basilar opacities, right greater than left, likely atelectasis.   Electronically Signed   By: Sandi Mariscal M.D.   On: 05/01/2014 22:32   Dg Hip Complete Right  05/01/2014   CLINICAL DATA:  Post fall attempting to  sit on bed, now with right hip pain. Initial encounter.  EXAM: RIGHT HIP - COMPLETE 2+ VIEW  COMPARISON:  None.  FINDINGS: No definite displaced right-sided hip fracture. Moderate degenerative change the right hip with joint space loss, subchondral sclerosis osteophytosis. No evidence of avascular necrosis. Regional soft tissues appear normal. No radiopaque foreign body.  IMPRESSION: 1. No acute findings. 2. Moderate degenerative change of the right hip.   Electronically Signed   By: Sandi Mariscal M.D.   On: 05/01/2014 22:25   Ct Pelvis Wo Contrast  05/01/2014   CLINICAL DATA:  Post fall now with right hip pain. Evaluate for hip fracture. Initial encounter.  EXAM: CT PELVIS WITHOUT CONTRAST  TECHNIQUE: Multidetector CT imaging of the pelvis was performed following the standard protocol without intravenous contrast.  COMPARISON:  Right hip radiographs -05/01/2014  FINDINGS: No hip or pelvic fracture.  There is moderate degenerative change the right hip with joint space loss, subchondral sclerosis and osteophytosis. There is mild degenerative change of the pubic symphysis. There is very mild degenerative change of the left hip.  Mild to moderate DDD of L5-S1 with disc space height loss, endplate irregularity and sclerosis.  Vascular calcifications. Scattered minimal colonic diverticulosis without evidence of diverticulitis within the imaged pelvis. Moderate colonic stool burden without evidence of enteric obstruction. Normal noncontrast appearance of the pelvic organs for age. No discrete adnexal lesion. Normal appearance the urinary bladder given degree distention.  Regional soft tissues appear normal.  No radiopaque foreign body.  IMPRESSION: 1. No hip or pelvic fracture. 2. Moderate degenerative change of the right hip.   Electronically Signed   By: Sandi Mariscal M.D.   On: 05/01/2014 23:48    My personal review of EKG: Atrial fibrillation with rapid ventricular rate and wide QRS    Assessment & Plan   1.  atrial fibrillation with RVR      History of chronic atrial fibrillation status post 2 failed attempts at cardioversion     Start Cardizem drip     Lopressor IV when necessary     Check TSH      2. Acute diastolic congestive heart failure     Lasix     Check echo  3. Right hip pain with negative x-ray and CT     Check MRI of the right hip     Pain control with Dilaudid  4. Diabetes mellitus type 2     Insulin sliding scale  5. Hypertension     Continue Cardizem     DVT Prophylaxis Eliquis  AM Labs Ordered, also please review Full Orders  Family Communication: Admission, patients condition and plan of care including tests being ordered have been discussed with the patient and husband who indicate understanding and agree with the plan and Code Status.  Code Status full  Disposition Plan: Home  Time spent in minutes : 33 minutes  Condition GUARDED   @SIGNATURE @

## 2014-05-02 NOTE — Progress Notes (Addendum)
[facility] TEAM 1 - Stepdown/ICU TEAM Progress Note  Alison Bell ION:[id number] DOB: February 14, 1946 DOA: 05/01/2014 PCP: [name], MD  Admit HPI / Brief Narrative: Alison Bell is a 68 y.o. WF PMHx Atrial fibrillation S/P (2) failed attempts of cardioversion, HTN, HLD, diabetes. Presenting after a fall with right hip pain. No preceding chest pain or headache, she just slipped. In the emergency room her heart rate was in the 150s-160s Patient denies any chest pains but reports shortness of breath now . Patient received 2 doses of Cardizem IV 10 mg and her heart rate continued to be in the 120s to 130s probably contributed to by pain. No nausea vomiting, no fever or chills. Her right hip workup including an x-ray and CT were both negative, MRI of the hip is still pending .   HPI/Subjective: 10/13  A./O. X4, negative CP, negative SOB, negative N./V. states only issue is right hip pain.    Assessment/Plan: Atrial fibrillation with RVR  -History of chronic atrial fibrillation status post 2 failed attempts at cardioversion  -Continue Cardizem drip  -Lopressor IV when necessary  -Check TSH   Acute diastolic congestive heart failure  -Continue Lasix 40 mg daily -Strict in and out -Daily weight -Obtain cardiac echo    Hypertension  -Continue Cardizem drip -Metoprolol IV 5 mg q 2 hour PRN HR > 100  Right hip pain with negative x-ray and CT  - MRI of the right hip negative -DC Dilaudid -Motrin 600 mg TID with meals -Ice packs to affected area  -Morphine PRN severe pain -Early mobilization in the a.m. as soon as atrial fibrillation controlled  Diabetes mellitus type 2  -Continue moderate SSI -Obtain hemoglobin A1c -Obtain lipid panel   Leukocytosis -Most likely reactive however if patient spikes a fever would panculture -Hold any antibiotics   Depression -Elavil 25 mg each bedtime   Code Status: FULL Family Communication: no family present at time of exam Disposition  Plan: Rate control    Consultants: NA    Procedure/Significant Events: 10/12 PCXR;: Cardiomegaly and pulmonary venous congestion.  -unchanged bilateral medial basilar opacities, Rt > Lt likely atelectasis. 10/12 DG right hip;. No acute findings.- Moderate degenerative change right hip 10/12 CT pelvis without contrast; No hip or pelvic fracture.- Moderate degenerative change right hip. 10/13 MRI Rt Hip; Small right hip joint effusion is nonspecific and likely incidental.      Culture NA  Antibiotics: N/A  DVT prophylaxis: Eliquis   Devices    LINES / TUBES:      Continuous Infusions: . sodium chloride    . diltiazem (CARDIZEM) infusion 15 mg/hr (05/02/14 0826)    Objective: VITAL SIGNS: BP: 129/58 mmHg (10/13 1230) Pulse Rate: 97 (10/13 1230) SPO2; FIO2:  No intake or output data in the 24 hours ending 05/02/14 1415   Exam: General: A./O. x4, NAD No acute respiratory distress Lungs: Clear to auscultation bilaterally without wheezes or crackles Cardiovascular: Irregular irregular rhythm and rate without murmur gallop or rub normal S1 and S2 Abdomen: Nontender, nondistended, soft, bowel sounds positive, no rebound, no ascites, no appreciable mass Extremities: No significant cyanosis, clubbing, or edema bilateral lower extremities, pain to palpation over the right greater trochanteric process to palpation  Data Reviewed: Basic Metabolic Panel:  Recent Labs Lab 05/01/14 2144 05/02/14 0107  NA 133* 132*  K 4.1 4.1  CL 89* 90*  CO2 20 24  GLUCOSE 217* 221*  BUN 13 14  CREATININE 0.92 0.94  CALCIUM 9.7 9.3  Liver Function Tests: No results found for this basename: AST, ALT, ALKPHOS, BILITOT, PROT, ALBUMIN,  in the last 168 hours No results found for this basename: LIPASE, AMYLASE,  in the last 168 hours No results found for this basename: AMMONIA,  in the last 168 hours CBC:  Recent Labs Lab 05/01/14 2144 05/02/14 0107  WBC 17.8* 15.4*    HGB 10.0* 9.5*  HCT 31.9* 30.2*  MCV 79.9 79.9  PLT 499* 454*   Cardiac Enzymes:  Recent Labs Lab 05/02/14 0107 05/02/14 0718 05/02/14 1301  TROPONINI <0.30 <0.30 <0.30   BNP (last 3 results) No results found for this basename: PROBNP,  in the last 8760 hours CBG:  Recent Labs Lab 05/02/14 0835 05/02/14 1300  GLUCAP 221* 183*    No results found for this or any previous visit (from the past 240 hour(s)).   Studies:  Recent x-ray studies have been reviewed in detail by the Attending Physician  Scheduled Meds:  Scheduled Meds: . amitriptyline  25 mg Oral QHS  . apixaban  5 mg Oral BID  . atorvastatin  10 mg Oral Daily  . famotidine  20 mg Oral BID  . folic acid  1 mg Oral Daily  . furosemide  40 mg Oral Daily  . insulin aspart  0-9 Units Subcutaneous TID WC  . loratadine  10 mg Oral Daily  . multivitamin with minerals  1 tablet Oral Daily  . potassium chloride SA  20 mEq Oral Daily  . predniSONE  2.5 mg Oral Daily  . sertraline  100 mg Oral Daily  . sodium chloride  3 mL Intravenous Q12H  . zolpidem  10 mg Oral QHS    Time spent on care of this patient: 40 mins   Allie Bossier , MD   [facility] Office  [phone] Pager - [phone]  On-Call/Text Page:      [email or web address]      password [email or web address]  If 7PM-7AM, please contact night-coverage [email or web address] Password [email or web address] 05/02/2014, 2:15 PM   LOS: 1 day

## 2014-05-02 NOTE — ED Notes (Signed)
Called in a carb modified diet for patient breakfast this morning.

## 2014-05-02 NOTE — ED Notes (Signed)
Notified RN of CBG 221

## 2014-05-02 NOTE — ED Notes (Signed)
Daily meds held, should be rescheduled for am

## 2014-05-02 NOTE — ED Notes (Signed)
Pt ate breakfas husband at bedside  Am meds ordered from pharm pt medicated for pain

## 2014-05-02 NOTE — ED Notes (Signed)
Patient in MRI with Radiology RN at this time.

## 2014-05-02 NOTE — ED Notes (Signed)
Patient transferred to 3S by this RN

## 2014-05-02 NOTE — ED Notes (Signed)
Notified RN of CBG 183

## 2014-05-02 NOTE — ED Notes (Signed)
Pt to mri via stretcher w/RN on monitor

## 2014-05-02 NOTE — Progress Notes (Signed)
Monitoring pt during MRI, monitor show Afib rate 80-90s

## 2014-05-02 NOTE — ED Provider Notes (Addendum)
CSN: [id number]     Arrival date & time 05/01/14  2124 History   First MD Initiated Contact with Patient 05/01/14 2142     Chief Complaint  Patient presents with  . Fall  . Atrial Fibrillation      HPI  Embolus evaluation of hip pain. She states that late last night or early this morning she was time to get it back into bed. She fell awkwardly onto the floor and injured her left hip. It is into your stomach in bed. Husband states that she was "moaning with pain". However she was able to get up a few times during the day and ambulate around the home although without the walker and her husband. Worsen the point she was unable to get out of bed. 911 was called. Found to be in rapid A. Fib.  Daily since. Previous failed cardioversion. However did convert spontaneously, following her cardioversion. CHADSVasC2 score of four, thus  remains anticoagulated with Elequis. And on Cardizem for rate control.   Past Medical History  Diagnosis Date  . Diabetes mellitus without complication   . Persistent atrial fibrillation 08/25/2012  . HTN (hypertension) 08/25/2012  . Hyperlipidemia 08/25/2012  . Depression   . Shortness of breath   . GERD (gastroesophageal reflux disease)   . LV dysfunction, EF 45-50% due to a. fib per echo 08/27/12  08/28/2012  . Atrial enlargement, left    Past Surgical History  Procedure Laterality Date  . Tubal ligation    . Tee without cardioversion N/A 09/06/2012    Procedure: TRANSESOPHAGEAL ECHOCARDIOGRAM (TEE);  Surgeon: [name], MD;  Location: [facility] ENDOSCOPY;  Service: Cardiovascular;  Laterality: N/A;  . Cardioversion N/A 09/06/2012    Procedure: CARDIOVERSION;  Surgeon: [name], MD;  Location: [facility] ENDOSCOPY;  Service: Cardiovascular;  Laterality: N/A;  . Tee without cardioversion N/A 11/01/2012    Procedure: TRANSESOPHAGEAL ECHOCARDIOGRAM (TEE);  Surgeon: [name], MD;  Location: [place];  Service: Cardiovascular;  Laterality: N/A;  . Cardioversion  N/A 01/07/2013    Procedure: CARDIOVERSION;  Surgeon: [name], MD;  Location: [facility] ENDOSCOPY;  Service: Cardiovascular;  Laterality: N/A;  . Atrial fibrillation ablation  11/02/12    PVI by Dr [name]  . Cardioversion N/A 11/14/2013    Procedure: CARDIOVERSION;  Surgeon: [name], MD;  Location: [facility] ENDOSCOPY;  Service: Cardiovascular;  Laterality: N/A;   Family History  Problem Relation Age of Onset  . Heart attack Mother   . Diabetes type II Father    History  Substance Use Topics  . Smoking status: Former Smoker    Quit date: 07/21/1994  . Smokeless tobacco: Never Used  . Alcohol Use: No   OB History   Grav Para Term Preterm Abortions TAB SAB Ect Mult Living                 Review of Systems  Constitutional: Negative for fever, chills, diaphoresis, appetite change and fatigue.  HENT: Negative for mouth sores, sore throat and trouble swallowing.   Eyes: Negative for visual disturbance.  Respiratory: Negative for cough, chest tightness, shortness of breath and wheezing.   Cardiovascular: Positive for palpitations. Negative for chest pain.  Gastrointestinal: Negative for nausea, vomiting, abdominal pain, diarrhea and abdominal distention.  Endocrine: Negative for polydipsia, polyphagia and polyuria.  Genitourinary: Negative for dysuria, frequency and hematuria.  Musculoskeletal: Negative for gait problem.       Right hip and pelvic pain  Skin: Negative for color change, pallor and  rash.  Neurological: Negative for dizziness, syncope, light-headedness and headaches.  Hematological: Does not bruise/bleed easily.  Psychiatric/Behavioral: Negative for behavioral problems and confusion.      Allergies  Benicar; Other; and Latex  Home Medications   Prior to Admission medications   Medication Sig Start Date End Date Taking? Authorizing Provider  amitriptyline (ELAVIL) 25 MG tablet Take 25 mg by mouth at bedtime.   Yes Historical Provider, MD  apixaban (ELIQUIS) 5  MG TABS tablet Take 1 tablet (5 mg total) by mouth 2 (two) times daily. 02/02/13  Yes [name], MD  atorvastatin (LIPITOR) 10 MG tablet Take 10 mg by mouth daily. 04/24/13  Yes Historical Provider, MD  cetirizine (ZYRTEC) 10 MG tablet Take 10 mg by mouth daily.   Yes Historical Provider, MD  CHOLECALCIFEROL PO Take 1 tablet by mouth daily.   Yes Historical Provider, MD  diltiazem (CARTIA XT) 120 MG 24 hr capsule Take 120 mg by mouth daily.   Yes Historical Provider, MD  famotidine (PEPCID) 20 MG tablet Take 20 mg by mouth 2 (two) times daily.   Yes Historical Provider, MD  folic acid (FOLVITE) 1 MG tablet Take 1 mg by mouth daily.   Yes Historical Provider, MD  furosemide (LASIX) 40 MG tablet Take 40 mg by mouth daily.   Yes Historical Provider, MD  HYDROcodone-acetaminophen (NORCO/VICODIN) 5-325 MG per tablet Take 1 tablet by mouth every 6 (six) hours as needed. 12/26/13  Yes Historical Provider, MD  metFORMIN (GLUCOPHAGE) 1000 MG tablet Take 1,000 mg by mouth 2 (two) times daily with a meal.   Yes Historical Provider, MD  Methotrexate Sodium, PF, 250 MG/10ML SOLN Inject 0.6 mLs as directed every 7 (seven) days. Thursdays   Yes Historical Provider, MD  Multiple Vitamin (MULTIVITAMIN WITH MINERALS) TABS Take 1 tablet by mouth daily.   Yes Historical Provider, MD  NOVOLIN 70/30 RELION (70-30) 100 UNIT/ML injection Inject 40 units in the morning and 25 units at night 09/13/13  Yes Historical Provider, MD  potassium chloride SA (K-DUR,KLOR-CON) 20 MEQ tablet Take 1 tablet (20 mEq total) by mouth daily. 03/15/14  Yes [name], MD  predniSONE (DELTASONE) 5 MG tablet Take 2.5 mg by mouth daily.  11/01/13  Yes Historical Provider, MD  sertraline (ZOLOFT) 100 MG tablet Take 100 mg by mouth daily.   Yes Historical Provider, MD  zolpidem (AMBIEN) 10 MG tablet Take 10 mg by mouth at bedtime.  10/12/12  Yes Historical Provider, MD   BP 166/69  Pulse 60  Temp(Src) 97.7 F (36.5 C) (Oral)  Resp 18  Ht  5\' 2"  (1.575 m)  Wt 195 lb (88.451 kg)  BMI 35.66 kg/m2  SpO2 90% Physical Exam  Constitutional: She is oriented to person, place, and time. She appears well-developed and well-nourished. No distress.  HENT:  Head: Normocephalic.  Eyes: Conjunctivae are normal. Pupils are equal, round, and reactive to light. No scleral icterus.  Neck: Normal range of motion. Neck supple. No thyromegaly present.  Cardiovascular: An irregularly irregular rhythm present. Tachycardia present.  Exam reveals no gallop and no friction rub.   No murmur heard. Pulmonary/Chest: Effort normal and breath sounds normal. No respiratory distress. She has no wheezes. She has no rales.  Abdominal: Soft. Bowel sounds are normal. She exhibits no distension. There is no tenderness. There is no rebound.  Musculoskeletal: Normal range of motion.       Legs: Neurological: She is alert and oriented to person, place, and time.  Skin:  Skin is warm and dry. No rash noted.  Psychiatric: She has a normal mood and affect. Her behavior is normal.    ED Course  Procedures (including critical care time) Labs Review Labs Reviewed  BASIC METABOLIC PANEL - Abnormal; Notable for the following:    Sodium 133 (*)    Chloride 89 (*)    Glucose, Bld 217 (*)    GFR calc non Af Amer 62 (*)    GFR calc Af Amer 71 (*)    Anion gap 24 (*)    All other components within normal limits  CBC - Abnormal; Notable for the following:    WBC 17.8 (*)    Hemoglobin 10.0 (*)    HCT 31.9 (*)    MCH 25.1 (*)    RDW 19.5 (*)    Platelets 499 (*)    All other components within normal limits  PROTIME-INR - Abnormal; Notable for the following:    Prothrombin Time 15.4 (*)    All other components within normal limits  URINE CULTURE  URINALYSIS, ROUTINE W REFLEX MICROSCOPIC  I-STAT TROPOININ, ED  TYPE AND SCREEN  ABO/RH    Imaging Review Dg Chest 1 View  05/01/2014   CLINICAL DATA:  Post fall.  Initial encounter.  EXAM: CHEST - 1 VIEW   COMPARISON:  01/06/2013; 08/25/2012  FINDINGS: Grossly unchanged enlarged cardiac silhouette and mediastinal contours. Bilateral medial basilar heterogeneous opacities are unchanged, right greater than left. Mild pulmonary venous congestion without frank evidence of edema. No pleural effusion pneumothorax. No acute osseus abnormalities.  IMPRESSION: 1. Cardiomegaly and pulmonary venous congestion without definite acute cardiopulmonary disease. 2. Grossly unchanged bilateral medial basilar opacities, right greater than left, likely atelectasis.   Electronically Signed   By: Sandi Mariscal M.D.   On: 05/01/2014 22:32   Dg Hip Complete Right  05/01/2014   CLINICAL DATA:  Post fall attempting to sit on bed, now with right hip pain. Initial encounter.  EXAM: RIGHT HIP - COMPLETE 2+ VIEW  COMPARISON:  None.  FINDINGS: No definite displaced right-sided hip fracture. Moderate degenerative change the right hip with joint space loss, subchondral sclerosis osteophytosis. No evidence of avascular necrosis. Regional soft tissues appear normal. No radiopaque foreign body.  IMPRESSION: 1. No acute findings. 2. Moderate degenerative change of the right hip.   Electronically Signed   By: Sandi Mariscal M.D.   On: 05/01/2014 22:25   Ct Pelvis Wo Contrast  05/01/2014   CLINICAL DATA:  Post fall now with right hip pain. Evaluate for hip fracture. Initial encounter.  EXAM: CT PELVIS WITHOUT CONTRAST  TECHNIQUE: Multidetector CT imaging of the pelvis was performed following the standard protocol without intravenous contrast.  COMPARISON:  Right hip radiographs -05/01/2014  FINDINGS: No hip or pelvic fracture. There is moderate degenerative change the right hip with joint space loss, subchondral sclerosis and osteophytosis. There is mild degenerative change of the pubic symphysis. There is very mild degenerative change of the left hip.  Mild to moderate DDD of L5-S1 with disc space height loss, endplate irregularity and sclerosis.   Vascular calcifications. Scattered minimal colonic diverticulosis without evidence of diverticulitis within the imaged pelvis. Moderate colonic stool burden without evidence of enteric obstruction. Normal noncontrast appearance of the pelvic organs for age. No discrete adnexal lesion. Normal appearance the urinary bladder given degree distention.  Regional soft tissues appear normal.  No radiopaque foreign body.  IMPRESSION: 1. No hip or pelvic fracture. 2. Moderate degenerative change of the right hip.  Electronically Signed   By: Sandi Mariscal M.D.   On: 05/01/2014 23:48     EKG Interpretation None      MDM   Final diagnoses:  Hip pain  Atrial fibrillation with rapid ventricular response    Plain film, however CT is not show obvious fracture the hip or pelvis. MRI unavailable tonight. Given Cardizem IV and her rate is controlled. Most recent pulse 119. She is still quite painful after several doses IV pain medication. She'll need MRI to completely rule out occult fracture.   Plan will be admission for rate control an MRI to rule out occult fracture.    Tanna Furry, MD 05/02/14 0007  Tanna Furry, MD 05/02/14 [id number]

## 2014-05-03 DIAGNOSIS — M25551 Pain in right hip: Secondary | ICD-10-CM

## 2014-05-03 DIAGNOSIS — I5032 Chronic diastolic (congestive) heart failure: Secondary | ICD-10-CM

## 2014-05-03 DIAGNOSIS — I482 Chronic atrial fibrillation: Secondary | ICD-10-CM | POA: Diagnosis not present

## 2014-05-03 LAB — URINE CULTURE
Colony Count: NO GROWTH
Culture: NO GROWTH

## 2014-05-03 LAB — GLUCOSE, CAPILLARY
Glucose-Capillary: 230 mg/dL — ABNORMAL HIGH (ref 70–99)
Glucose-Capillary: 321 mg/dL — ABNORMAL HIGH (ref 70–99)
Glucose-Capillary: 245 mg/dL — ABNORMAL HIGH (ref 70–99)
Glucose-Capillary: 265 mg/dL — ABNORMAL HIGH (ref 70–99)

## 2014-05-03 LAB — TSH: TSH: 0.628 u[IU]/mL (ref 0.350–4.500)

## 2014-05-03 LAB — HEMOGLOBIN A1C
Hgb A1c MFr Bld: 6.6 % — ABNORMAL HIGH (ref <5.7)
Mean Plasma Glucose: 143 mg/dL — ABNORMAL HIGH (ref <117)

## 2014-05-03 MED ORDER — SODIUM CHLORIDE 0.9 % IV SOLN
INTRAVENOUS | Status: DC
  Administered 2014-05-03: 22:00:00 via INTRAVENOUS

## 2014-05-03 MED ORDER — MAGNESIUM SULFATE 40 MG/ML IJ SOLN
2.0000 g | Freq: Once | INTRAMUSCULAR | Status: AC
  Administered 2014-05-03: 2 g via INTRAVENOUS
  Filled 2014-05-03: qty 50

## 2014-05-03 MED ORDER — OXYCODONE HCL 5 MG PO TABS: 5.0000 mg | ORAL_TABLET | ORAL | Status: DC | PRN

## 2014-05-03 NOTE — Consult Note (Addendum)
CARDIOLOGY CONSULT NOTE   [name] MRN: [id number], DOB/AGE: [birth year]   Admit date: 05/01/2014 Date of Consult: 05/03/2014   Primary Physician: [name], MD Primary Cardiologist: Dr. Allred/Dr. [name]  Pt. Profile  68 year old woman admitted after a fall.  She did not have syncope.  She just slipped.  On admission she was noted to be back in atrial fibrillation.  Problem List  Past Medical History  Diagnosis Date  . Diabetes mellitus without complication   . Persistent atrial fibrillation 08/25/2012  . HTN (hypertension) 08/25/2012  . Hyperlipidemia 08/25/2012  . Depression   . Shortness of breath   . GERD (gastroesophageal reflux disease)   . LV dysfunction, EF 45-50% due to a. fib per echo 08/27/12  08/28/2012  . Atrial enlargement, left     Past Surgical History  Procedure Laterality Date  . Tubal ligation    . Tee without cardioversion N/A 09/06/2012    Procedure: TRANSESOPHAGEAL ECHOCARDIOGRAM (TEE);  Surgeon: [name], MD;  Location: [facility] ENDOSCOPY;  Service: Cardiovascular;  Laterality: N/A;  . Cardioversion N/A 09/06/2012    Procedure: CARDIOVERSION;  Surgeon: [name], MD;  Location: [facility] ENDOSCOPY;  Service: Cardiovascular;  Laterality: N/A;  . Tee without cardioversion N/A 11/01/2012    Procedure: TRANSESOPHAGEAL ECHOCARDIOGRAM (TEE);  Surgeon: [name], MD;  Location: [place];  Service: Cardiovascular;  Laterality: N/A;  . Cardioversion N/A 01/07/2013    Procedure: CARDIOVERSION;  Surgeon: [name], MD;  Location: [facility] ENDOSCOPY;  Service: Cardiovascular;  Laterality: N/A;  . Atrial fibrillation ablation  11/02/12    PVI by Dr [name]  . Cardioversion N/A 11/14/2013    Procedure: CARDIOVERSION;  Surgeon: [name], MD;  Location: [facility] ENDOSCOPY;  Service: Cardiovascular;  Laterality: N/A;     Allergies  Allergies  Allergen Reactions  . Benicar [Olmesartan] Anaphylaxis  . Other Itching and Swelling    White  meat chicken causes hands and feet to itch and swell.  . Latex Hives    HPI   This 68 year old woman has a history of previous atrial fibrillation.  She had cardioversion on 09/06/12.  She had atrial fibrillation ablation on 11/02/12 by Dr. [name].  She underwent direct current cardioversion on 01/07/13.  Her last cardioversion was on 11/14/13.  At that time she failed cardioversion but then went into normal sinus rhythm while in the recovery room.  She was seen by Dr. [name] for routine office visit on 01/26/14.  At that time she was in normal sinus rhythm.  She has a previous history of bradycardia on sotalol.  She checks her pulse was in oximeter at home on a regular basis and has not had any recurrent atrial fibrillation or tachycardia.  After she fell 2 days ago she was noted to be in atrial fibrillation with rapid ventricular response.  Her ventricular response is now controlled on diltiazem drip 10 mg per hour.  At home the patient was on diltiazem XT 120 mg daily.  The patient has been on long-term Apixaban 5 mg twice a day.  She has a history of mild congestive heart failure and has been on Lasix 40 mg daily and she takes potassium 20 mEq daily. Dr. [name] last office visit no definitive she had further problems with recurrent atrial fibrillation that he would consider Tikosyn and or another attempt at ablation.  The patient states that she does not want another ablation.  She would be willing to try Tikosyn.  EKG yesterday  shows QTC of 469 and the patient has a right bundle branch block.  Renal function reveals that her GFR is 48.  Potassium is 4.2.  Magnesium is 1.7  Inpatient Medications  . amitriptyline  25 mg Oral QHS  . apixaban  5 mg Oral BID  . atorvastatin  10 mg Oral Daily  . famotidine  20 mg Oral BID  . folic acid  1 mg Oral Daily  . furosemide  40 mg Oral Daily  . ibuprofen  600 mg Oral TID  . insulin aspart  0-9 Units Subcutaneous TID WC  . loratadine  10 mg Oral Daily  .  multivitamin with minerals  1 tablet Oral Daily  . potassium chloride SA  20 mEq Oral Daily  . predniSONE  2.5 mg Oral Daily  . sertraline  100 mg Oral Daily    Family History Family History  Problem Relation Age of Onset  . Heart attack Mother   . Diabetes type II Father      Social History History   Social History  . Marital Status: Married    Spouse Name: N/A    Number of Children: N/A  . Years of Education: N/A   Occupational History  . Not on file.   Social History Main Topics  . Smoking status: Former Smoker    Quit date: 07/21/1994  . Smokeless tobacco: Never Used  . Alcohol Use: No  . Drug Use: No  . Sexual Activity: Yes   Other Topics Concern  . Not on file   Social History Narrative   Lives in [place] with spouse.  Retired           Review of Systems  General:  No chills, fever, night sweats or weight changes.  Cardiovascular:  No chest pain, dyspnea on exertion, edema, orthopnea, palpitations, paroxysmal nocturnal dyspnea. Dermatological: No rash, lesions/masses Respiratory: No cough, dyspnea Urologic: No hematuria, dysuria Abdominal:   No nausea, vomiting, diarrhea, bright red blood per rectum, melena, or hematemesis Neurologic:  No visual changes, wkns, changes in mental status. All other systems reviewed and are otherwise negative except as noted above.  Physical Exam  Blood pressure 124/74, pulse 75, temperature 98.7 F (37.1 C), temperature source Oral, resp. rate 20, height 5\' 2"  (1.575 m), weight 193 lb 2 oz (87.6 kg), SpO2 70.00%.  General: Pleasant, NAD Psych: Normal affect. Neuro: Alert and oriented X 3. Moves all extremities spontaneously. HEENT: Normal  Neck: Supple without bruits or JVD. Lungs:  Resp regular and unlabored, CTA. Heart: Irregularly irregular rhythm. no s3, s4, or murmurs. Abdomen: Soft, non-tender, non-distended, BS + x 4.  Extremities: No clubbing, cyanosis or edema. DP/PT/Radials 2+ and equal  bilaterally.  Labs   Recent Labs  05/02/14 0107 05/02/14 0718 05/02/14 1301  TROPONINI <0.30 <0.30 <0.30   Lab Results  Component Value Date   WBC 11.7* 05/02/2014   HGB 9.0* 05/02/2014   HCT 28.6* 05/02/2014   MCV 81.5 05/02/2014   PLT 455* 05/02/2014     Recent Labs Lab 05/02/14 2050  NA 134*  K 4.2  CL 92*  CO2 27  BUN 18  CREATININE 1.13*  CALCIUM 9.5  PROT 7.9  BILITOT 0.5  ALKPHOS 67  ALT 17  AST 17  GLUCOSE 213*   Lab Results  Component Value Date   CHOL 153 05/02/2014   HDL 56 05/02/2014   LDLCALC 74 05/02/2014   TRIG 116 05/02/2014   No results found for this basename: DDIMER  Radiology/Studies  Dg Chest 1 View  05/01/2014   CLINICAL DATA:  Post fall.  Initial encounter.  EXAM: CHEST - 1 VIEW  COMPARISON:  01/06/2013; 08/25/2012  FINDINGS: Grossly unchanged enlarged cardiac silhouette and mediastinal contours. Bilateral medial basilar heterogeneous opacities are unchanged, right greater than left. Mild pulmonary venous congestion without frank evidence of edema. No pleural effusion pneumothorax. No acute osseus abnormalities.  IMPRESSION: 1. Cardiomegaly and pulmonary venous congestion without definite acute cardiopulmonary disease. 2. Grossly unchanged bilateral medial basilar opacities, right greater than left, likely atelectasis.   Electronically Signed   By: Sandi Mariscal M.D.   On: 05/01/2014 22:32   Dg Hip Complete Right  05/01/2014   CLINICAL DATA:  Post fall attempting to sit on bed, now with right hip pain. Initial encounter.  EXAM: RIGHT HIP - COMPLETE 2+ VIEW  COMPARISON:  None.  FINDINGS: No definite displaced right-sided hip fracture. Moderate degenerative change the right hip with joint space loss, subchondral sclerosis osteophytosis. No evidence of avascular necrosis. Regional soft tissues appear normal. No radiopaque foreign body.  IMPRESSION: 1. No acute findings. 2. Moderate degenerative change of the right hip.   Electronically  Signed   By: Sandi Mariscal M.D.   On: 05/01/2014 22:25   Ct Pelvis Wo Contrast  05/01/2014   CLINICAL DATA:  Post fall now with right hip pain. Evaluate for hip fracture. Initial encounter.  EXAM: CT PELVIS WITHOUT CONTRAST  TECHNIQUE: Multidetector CT imaging of the pelvis was performed following the standard protocol without intravenous contrast.  COMPARISON:  Right hip radiographs -05/01/2014  FINDINGS: No hip or pelvic fracture. There is moderate degenerative change the right hip with joint space loss, subchondral sclerosis and osteophytosis. There is mild degenerative change of the pubic symphysis. There is very mild degenerative change of the left hip.  Mild to moderate DDD of L5-S1 with disc space height loss, endplate irregularity and sclerosis.  Vascular calcifications. Scattered minimal colonic diverticulosis without evidence of diverticulitis within the imaged pelvis. Moderate colonic stool burden without evidence of enteric obstruction. Normal noncontrast appearance of the pelvic organs for age. No discrete adnexal lesion. Normal appearance the urinary bladder given degree distention.  Regional soft tissues appear normal.  No radiopaque foreign body.  IMPRESSION: 1. No hip or pelvic fracture. 2. Moderate degenerative change of the right hip.   Electronically Signed   By: Sandi Mariscal M.D.   On: 05/01/2014 23:48   Mr Femur Right Wo Contrast  05/02/2014   CLINICAL DATA:  Status post fall 8 2 a.m. 05/02/2014 on to heart with or. Right hip and upper leg pain. Question fracture. Initially catheter.  EXAM: MR OF THE RIGHT HIP UPPER LEG WITHOUT CONTRAST  TECHNIQUE: Multiplanar, multisequence MR imaging was performed. No intravenous contrast was administered.  COMPARISON:  Plain films right hip and CT scan of the pelvis earlier this same day.  FINDINGS: Bones: Please note that the patient refused to complete the examination. Only sagittal and axial T1 weighted images through the upper leg are provided. Hip  imaging is complete.  Both hips are located. No fracture is identified in the pelvis or femur. The pelvis appears intact. No worrisome marrow lesion is identified. Degenerative endplate signal change lower lumbar spine is noted.  Articular cartilage and labrum  Articular cartilage: Mildly degenerated. There is some osteophytosis about the right femoral head.  Labrum:  Appears intact.  Joint or bursal effusion  Joint effusion:  Small right hip joint effusion is seen.  Bursae:  No evidence of bursitis.  Muscles and tendons  Muscles and tendons:  Intact.  Other findings  Miscellaneous: Imaged intrapelvic contents demonstrate no focal abnormality.  IMPRESSION: Negative for fracture. As noted above, only T1 weighted imaging of the femur is provided but no abnormality is seen.  Small right hip joint effusion is nonspecific and likely incidental. It may be related to mild to moderate right hip osteoarthritis.   Electronically Signed   By: Inge Rise M.D.   On: 05/02/2014 16:10   Mr Hip Right Wo Contrast  05/02/2014   CLINICAL DATA:  Status post fall 8 2 a.m. 05/02/2014 on to heart with or. Right hip and upper leg pain. Question fracture. Initially catheter.  EXAM: MR OF THE RIGHT HIP UPPER LEG WITHOUT CONTRAST  TECHNIQUE: Multiplanar, multisequence MR imaging was performed. No intravenous contrast was administered.  COMPARISON:  Plain films right hip and CT scan of the pelvis earlier this same day.  FINDINGS: Bones: Please note that the patient refused to complete the examination. Only sagittal and axial T1 weighted images through the upper leg are provided. Hip imaging is complete.  Both hips are located. No fracture is identified in the pelvis or femur. The pelvis appears intact. No worrisome marrow lesion is identified. Degenerative endplate signal change lower lumbar spine is noted.  Articular cartilage and labrum  Articular cartilage: Mildly degenerated. There is some osteophytosis about the right femoral  head.  Labrum:  Appears intact.  Joint or bursal effusion  Joint effusion:  Small right hip joint effusion is seen.  Bursae:  No evidence of bursitis.  Muscles and tendons  Muscles and tendons:  Intact.  Other findings  Miscellaneous: Imaged intrapelvic contents demonstrate no focal abnormality.  IMPRESSION: Negative for fracture. As noted above, only T1 weighted imaging of the femur is provided but no abnormality is seen.  Small right hip joint effusion is nonspecific and likely incidental. It may be related to mild to moderate right hip osteoarthritis.   Electronically Signed   By: Inge Rise M.D.   On: 05/02/2014 16:10    ECG  Atrial fibrillation with controlled ventricular response.  Right bundle branch block  ASSESSMENT AND PLAN  1.  Recurrent atrial fibrillation.  Past history of atrial fibrillation ablation in April 2014.  Heart rate is now well controlled on IV Cardizem.  On chronic eliquis. 2. chronic diastolic heart failure.  Will update echocardiogram.  Previous ejection fractions were borderline.  Plan: The patient is willing to try Tikosyn.  She will need to be transferred to 2500 or another floor which is certified for Tikosyn administration.  Her serum magnesium is low and is being repleted.  Her magnesium level will be rechecked in the morning.  If magnesium is satisfactory Tikosyn can be started tomorrow morning per Tikosyn protocol. Will stop amitriptyline which can prolong QT interval.   Discussed transfer with Dr. Thereasa Solo.  Signed, Darlin Coco, MD  05/03/2014, 3:06 PM

## 2014-05-03 NOTE — Evaluation (Signed)
Reviewed assessment and agree with POC and recommendations for discharge.  Alben Deeds, [place] DPT  [phone]

## 2014-05-03 NOTE — Progress Notes (Signed)
[facility] TEAM 1 - Stepdown/ICU TEAM Progress Note  Alison Bell HYI:[id number] DOB: 1945-10-13 DOA: 05/01/2014 PCP: [name], MD  Admit HPI / Brief Narrative: 68 y.o. F Hx Atrial fibrillation S/P 4 failed attempts at cardioversion, HTN, HLD, and diabetes, who presenting after a fall with right hip pain. No preceding chest pain or headache, she just slipped.   In the emergency room her heart rate was in the 150s-160s.  Patient received 2 doses of Cardizem IV 10 mg and her heart rate continued to be in the 120s to 130s. Her right hip workup including an x-ray and CT were both negative.  MRI of the hip was ordered.  HPI/Subjective: Pt c/o ongoing pain in her R hip. She denies cp, n/v, abdom pain, or palpitations.  Does not wish to consider repeat ablation, but would be open to antiarrythmic meds.    Assessment/Plan:  Chronic Atrial fibrillation with Actue RVR  -History of chronic atrial fibrillation status post 4 failed attempts at cardioversion as well as ablation - followed by [facility] Cards and EP  -last Cards note suggested possible Tikosyn or repeat ablation if recurs (July 2015) -Continue Cardizem drip for now - ask Cards to see  -Lopressor IV when necessary  -TSH is normal - Mg is ok, but will push to 2.0 -Chads2vasc score is at least 4 - on chronic Eliquis  Acute diastolic congestive heart failure  -Continue Lasix 40 mg daily -Strict in and out -Daily weight - dry weight appears to be as low as 84kg, but typical weight is ~88kg -appears to be well compensated at visit today, w/ wgt at 87.6kg  Hypertension  -BP currently reasonably controlled - follow trend   Right hip pain with negative x-ray and CT  -MRI of the right hip negative -Motrin 600 mg TID with meals for 5 days max  -Ice packs to affected area  -Morphine PRN severe pain -Early mobilization  Diabetes mellitus type 2  -Continue moderate SSI -A1c 6.6  HLD -LDL 74 - cont lipitor    Leukocytosis -Most likely reactive however if patient spikes a fever would panculture -Hold antibiotics   Depression -Elavil 25 mg each bedtime  Code Status: FULL Family Communication: no family present at time of exam Disposition Plan: SDU until off cardizem gtt  Consultants: Cardiology   Procedure/Significant Events: 10/12 PCXR Cardiomegaly and pulmonary venous congestion.  -unchanged bilateral medial basilar opacities, Rt > Lt likely atelectasis. 10/12 DG right hip No acute findings.- Moderate degenerative change right hip 10/12 CT pelvis without contrast No hip or pelvic fracture.- Moderate degenerative change right hip. 10/13 MRI Rt Hip Small right hip joint effusion is nonspecific and likely incidental.   Culture NA  Antibiotics: N/A  DVT prophylaxis: Eliquis  Objective: Blood pressure 145/68, pulse 83, temperature 98.6 F (37 C), temperature source Oral, resp. rate 15, height 5\' 2"  (1.575 m), weight 87.6 kg (193 lb 2 oz), SpO2 98.00%.  Intake/Output Summary (Last 24 hours) at 05/03/14 1141 Last data filed at 05/03/14 0900  Gross per 24 hour  Intake 530.38 ml  Output   1225 ml  Net -694.62 ml    Exam: General: No acute respiratory distress Lungs: Clear to auscultation bilaterally without wheezes or crackles Cardiovascular: Irregular irregular rhythm without murmur gallop or rub normal S1 and S2 Abdomen: Nontender, nondistended, soft, bowel sounds positive, no rebound, no ascites, no appreciable mass Extremities: No significant cyanosis, clubbing, or edema bilateral lower extremities, pain to palpation over the right hip  Data Reviewed: Basic Metabolic Panel:  Recent Labs Lab 05/01/14 2144 05/02/14 0107 05/02/14 2050  NA 133* 132* 134*  K 4.1 4.1 4.2  CL 89* 90* 92*  CO2 20 24 27   GLUCOSE 217* 221* 213*  BUN 13 14 18   CREATININE 0.92 0.94 1.13*  CALCIUM 9.7 9.3 9.5  MG  --   --  1.7   Liver Function Tests:  Recent Labs Lab 05/02/14 2050   AST 17  ALT 17  ALKPHOS 67  BILITOT 0.5  PROT 7.9  ALBUMIN 3.6   CBC:  Recent Labs Lab 05/01/14 2144 05/02/14 0107 05/02/14 2050  WBC 17.8* 15.4* 11.7*  NEUTROABS  --   --  9.4*  HGB 10.0* 9.5* 9.0*  HCT 31.9* 30.2* 28.6*  MCV 79.9 79.9 81.5  PLT 499* 454* 455*   Cardiac Enzymes:  Recent Labs Lab 05/02/14 0107 05/02/14 0718 05/02/14 1301  TROPONINI <0.30 <0.30 <0.30   CBG:  Recent Labs Lab 05/02/14 0835 05/02/14 1300 05/02/14 1605 05/02/14 2135 05/03/14 0803  GLUCAP 221* 183* 220* 224* 230*    Recent Results (from the past 240 hour(s))  URINE CULTURE     Status: None   Collection Time    05/02/14 12:30 AM      Result Value Ref Range Status   Specimen Description URINE, RANDOM   Final   Special Requests NONE   Final   Culture  Setup Time     Final   Value: 05/02/2014 05:05     Performed at [place]     Final   Value: NO GROWTH     Performed at [facility]   Culture     Final   Value: NO GROWTH     Performed at [facility]   Report Status 05/03/2014 FINAL   Final  MRSA PCR SCREENING     Status: None   Collection Time    05/02/14  4:47 PM      Result Value Ref Range Status   MRSA by PCR NEGATIVE  NEGATIVE Final   Comment:            The GeneXpert MRSA Assay (FDA     approved for NASAL specimens     only), is one component of a     comprehensive MRSA colonization     surveillance program. It is not     intended to diagnose MRSA     infection nor to guide or     monitor treatment for     MRSA infections.     Studies:  Recent x-ray studies have been reviewed in detail by the Attending Physician  Scheduled Meds:  Scheduled Meds: . amitriptyline  25 mg Oral QHS  . apixaban  5 mg Oral BID  . atorvastatin  10 mg Oral Daily  . famotidine  20 mg Oral BID  . folic acid  1 mg Oral Daily  . furosemide  40 mg Oral Daily  . ibuprofen  600 mg Oral TID  . insulin aspart  0-9 Units Subcutaneous TID WC   . loratadine  10 mg Oral Daily  . multivitamin with minerals  1 tablet Oral Daily  . potassium chloride SA  20 mEq Oral Daily  . predniSONE  2.5 mg Oral Daily  . sertraline  100 mg Oral Daily  . sodium chloride  3 mL Intravenous Q12H    Time spent on care of this patient: 35 mins  Dellis Filbert  Hennie Duos, MD [facility] For Consults/Admissions - Flow Manager [phone] Office  [phone] Pager [phone]  On-Call/Text Page:      [email or web address]      password [facility]   05/03/2014, 11:41 AM   LOS: 2 days

## 2014-05-03 NOTE — Progress Notes (Signed)
Inpatient Diabetes Program Recommendations  AACE/ADA: New Consensus Statement on Inpatient Glycemic Control (2013)  Target Ranges:  Prepandial:   less than 140 mg/dL      Peak postprandial:   less than 180 mg/dL (1-2 hours)      Critically ill patients:  140 - 180 mg/dL   CBG's running in 200's and 300's please consider addition of insulin: Inpatient Diabetes Program Recommendations Insulin - Basal: Pt takes novolin 70/30 at home- 40 units in the am and 25 units in the pm. Pt would benefit from at least 1/2 home doses: 20 units in the am and 15 units ac supper.   If want to use basal and meal coverage or basal and correction alone, Home basal total from 70/30 totals 43 units-could start with 25 units levemir/lantus And meal coveraage of 4 units tidwc.  Continue sensitive correction and add the HS scale please.  Thank you, Rosita Kea, RN, CNS, Diabetes Coordinator [phone])

## 2014-05-03 NOTE — Evaluation (Signed)
Physical Therapy Evaluation Patient Details Name: Alison Bell MRN: [id number] DOB: Aug 09, 1945 Today's Date: 05/03/2014   History of Present Illness  Pt is a 68 y.o. female presenting with Afib and R hip pain after falling on 05/01/14. Pt with hx of Afib s/p 2 failed attempts of cardioversion, HTN, HLD, and DM.  Clinical Impression  Pt currently requires assistance with AD for all functional mobility. Prior to fall on 05/01/14, pt reports being indpendent with all ADLs and mobility. Pt will benefit from continued skilled PT services to address functional deficits noted below. During session, no bouts of Afib. POC and d/c plan discussed with pt and her husband, both agreeable. Recommending home with 24hr supervision/assist and HHPT.     Follow Up Recommendations Home health PT;Supervision/Assistance - 24 hour    Equipment Recommendations  None recommended by PT    Recommendations for Other Services       Precautions / Restrictions Precautions Precautions: Fall Restrictions Weight Bearing Restrictions: No      Mobility  Bed Mobility Overal bed mobility: Needs Assistance Bed Mobility: Supine to Sit     Supine to sit: Min guard;HOB elevated     General bed mobility comments: heavy reliance on rails. extra time. guarding for safety.   Transfers Overall transfer level: Needs assistance Equipment used: Rolling walker (2 wheeled) Transfers: Sit to/from [facility] Sit to Stand: Min assist Stand pivot transfers: Min assist (bed to [facility])       General transfer comment: instructed on technique with RW. 1 instance of near LOB posteriorly with initial standing, assistance required to correct.   Ambulation/Gait Ambulation/Gait assistance: Min guard Ambulation Distance (Feet): 50 Feet Assistive device: Rolling walker (2 wheeled) Gait Pattern/deviations: Decreased stance time - right;Decreased stride length;Decreased weight shift to right;Antalgic;Trunk  flexed;Decreased step length - left;Step-to pattern Gait velocity: slow Gait velocity interpretation: Below normal speed for age/gender General Gait Details: pt with limited hip flexion B, reduced trunk rotation and very slow gait speed. Pt demonstrating step to gait pattern, leading with R. No cueing requuired for use of RW during ambulation. HR below 115bpm for entire bout of amb. O2 maintained >94%.  Stairs            Wheelchair Mobility    Modified Rankin (Stroke Patients Only)       Balance Overall balance assessment: Needs assistance Sitting-balance support: Bilateral upper extremity supported;Feet supported Sitting balance-Leahy Scale: Fair Sitting balance - Comments: able to maintain balance without support   Standing balance support: During functional activity;Bilateral upper extremity supported (use of RW) Standing balance-Leahy Scale: Fair Standing balance comment: pt requires use of RW for stability. 1 bout of near LOB posteriorly with sit-to-stand from [facility], requiring assist to correct.                             Pertinent Vitals/Pain Pain Assessment: 0-10 Pain Score: 3  (reduced with ambulation/OOB) Pain Location: R hip Pain Intervention(s): Monitored during session;Limited activity within patient's tolerance    Home Living Family/patient expects to be discharged to:: Private residence Living Arrangements: Spouse/significant other Available Help at Discharge: Family Type of Home: House Home Access: Stairs to enter Entrance Stairs-Rails: Right (rails on L if use back steps) Entrance Stairs-Number of Steps: 4 Home Layout: One level Home Equipment: Walker - 2 wheels;Bedside commode      Prior Function Level of Independence: Independent         Comments: some difficulty  getting in/out of bed due to height of bed, possibly getting a step stool     Hand Dominance        Extremity/Trunk Assessment   Upper Extremity Assessment:  Generalized weakness           Lower Extremity Assessment: Generalized weakness;RLE deficits/detail RLE Deficits / Details: ROM WFL, Stength WFL. pain at hip 2/2 joint effusion    Cervical / Trunk Assessment: Kyphotic  Communication   Communication: No difficulties  Cognition Arousal/Alertness: Awake/alert Behavior During Therapy: WFL for tasks assessed/performed Overall Cognitive Status: Within Functional Limits for tasks assessed                      General Comments General comments (skin integrity, edema, etc.): Pt with some fatigue after minimal activity (ex. signs of SOB after bed mobility). Pt instructed on use of DME throughout session. POC and d/c plan discussed with pt and husband, both agreeable to d/c home with supervision and  HHPT. BSC used during treatment, NSG notified    Exercises General Exercises - Lower Extremity Ankle Circles/Pumps: AROM;Both;Other reps (comment);Supine (69min continuous)      Assessment/Plan    PT Assessment Patient needs continued PT services  PT Diagnosis Difficulty walking;Acute pain;Generalized weakness   PT Problem List Decreased strength;Decreased activity tolerance;Decreased balance;Decreased mobility;Decreased knowledge of use of DME;Cardiopulmonary status limiting activity  PT Treatment Interventions DME instruction;Gait training;Stair training;Functional mobility training;Therapeutic activities;Therapeutic exercise;Balance training;Patient/family education   PT Goals (Current goals can be found in the Care Plan section) Acute Rehab PT Goals Patient Stated Goal: to go home PT Goal Formulation: With patient/family Time For Goal Achievement: 05/17/14 Potential to Achieve Goals: Good    Frequency Min 3X/week   Barriers to discharge Decreased caregiver support;Inaccessible home environment Pt must be able to navigate steps. Pt's husband may able to provide sufficient assistance for mobility, depending on how much  assistance required at d/c    Co-evaluation               End of Session Equipment Utilized During Treatment: Gait belt Activity Tolerance: Patient tolerated treatment well Patient left: in chair;with call bell/phone within reach;with family/visitor present Nurse Communication: Mobility status (HR during session)         Time: 1334-1400 PT Time Calculation (min): 26 min   Charges:         PT G Codes:          Drue Harr 05/03/2014, 2:37 PM Levonne Hubert, SPT

## 2014-05-03 NOTE — Progress Notes (Signed)
Patient was transported to 2h26 in stable condition. Report given to RN Audra.

## 2014-05-04 DIAGNOSIS — D72829 Elevated white blood cell count, unspecified: Secondary | ICD-10-CM

## 2014-05-04 DIAGNOSIS — I059 Rheumatic mitral valve disease, unspecified: Secondary | ICD-10-CM

## 2014-05-04 DIAGNOSIS — I509 Heart failure, unspecified: Secondary | ICD-10-CM

## 2014-05-04 DIAGNOSIS — I429 Cardiomyopathy, unspecified: Secondary | ICD-10-CM

## 2014-05-04 DIAGNOSIS — W19XXXS Unspecified fall, sequela: Secondary | ICD-10-CM

## 2014-05-04 DIAGNOSIS — I110 Hypertensive heart disease with heart failure: Secondary | ICD-10-CM

## 2014-05-04 LAB — COMPREHENSIVE METABOLIC PANEL
ALT: 14 U/L (ref 0–35)
AST: 15 U/L (ref 0–37)
Albumin: 3.2 g/dL — ABNORMAL LOW (ref 3.5–5.2)
Alkaline Phosphatase: 77 U/L (ref 39–117)
Anion gap: 15 (ref 5–15)
BUN: 24 mg/dL — ABNORMAL HIGH (ref 6–23)
CO2: 26 mEq/L (ref 19–32)
Calcium: 9.5 mg/dL (ref 8.4–10.5)
Chloride: 94 mEq/L — ABNORMAL LOW (ref 96–112)
Creatinine, Ser: 1.09 mg/dL (ref 0.50–1.10)
GFR calc Af Amer: 58 mL/min — ABNORMAL LOW (ref >90)
GFR calc non Af Amer: 50 mL/min — ABNORMAL LOW (ref >90)
Glucose, Bld: 281 mg/dL — ABNORMAL HIGH (ref 70–99)
Potassium: 3.9 mEq/L (ref 3.7–5.3)
Sodium: 135 mEq/L — ABNORMAL LOW (ref 137–147)
Total Bilirubin: 0.3 mg/dL (ref 0.3–1.2)
Total Protein: 7.2 g/dL (ref 6.0–8.3)

## 2014-05-04 LAB — CBC
HCT: 24.8 % — ABNORMAL LOW (ref 36.0–46.0)
Hemoglobin: 7.8 g/dL — ABNORMAL LOW (ref 12.0–15.0)
MCH: 25.1 pg — ABNORMAL LOW (ref 26.0–34.0)
MCHC: 31.5 g/dL (ref 30.0–36.0)
MCV: 79.7 fL (ref 78.0–100.0)
Platelets: 401 10*3/uL — ABNORMAL HIGH (ref 150–400)
RBC: 3.11 MIL/uL — ABNORMAL LOW (ref 3.87–5.11)
RDW: 20 % — ABNORMAL HIGH (ref 11.5–15.5)
WBC: 11 10*3/uL — ABNORMAL HIGH (ref 4.0–10.5)

## 2014-05-04 LAB — GLUCOSE, CAPILLARY
Glucose-Capillary: 268 mg/dL — ABNORMAL HIGH (ref 70–99)
Glucose-Capillary: 174 mg/dL — ABNORMAL HIGH (ref 70–99)
Glucose-Capillary: 301 mg/dL — ABNORMAL HIGH (ref 70–99)

## 2014-05-04 LAB — MAGNESIUM
Magnesium: 2.1 mg/dL (ref 1.5–2.5)
Magnesium: 2 mg/dL (ref 1.5–2.5)

## 2014-05-04 MED ORDER — ISOPROTERENOL HCL 0.2 MG/ML IJ SOLN
2.0000 ug/min | INTRAVENOUS | Status: DC
  Administered 2014-05-04: 4 ug/min via INTRAVENOUS
  Filled 2014-05-04: qty 5

## 2014-05-04 MED ORDER — INSULIN ASPART PROT & ASPART (70-30 MIX) 100 UNIT/ML ~~LOC~~ SUSP
20.0000 [IU] | Freq: Every day | SUBCUTANEOUS | Status: DC
  Administered 2014-05-04 – 2014-05-06 (×3): 20 [IU] via SUBCUTANEOUS
  Filled 2014-05-04: qty 10

## 2014-05-04 MED ORDER — SERTRALINE HCL 100 MG PO TABS
100.0000 mg | ORAL_TABLET | Freq: Every day | ORAL | Status: DC
  Filled 2014-05-04: qty 1

## 2014-05-04 MED ORDER — DOFETILIDE 500 MCG PO CAPS
500.0000 ug | ORAL_CAPSULE | Freq: Two times a day (BID) | ORAL | Status: DC
  Administered 2014-05-04 (×2): 500 ug via ORAL
  Filled 2014-05-04 (×3): qty 1

## 2014-05-04 MED ORDER — MAGNESIUM SULFATE 40 MG/ML IJ SOLN
INTRAMUSCULAR | Status: AC
  Filled 2014-05-04: qty 50

## 2014-05-04 MED ORDER — SODIUM CHLORIDE 0.9 % IJ SOLN: 3.0000 mL | INTRAMUSCULAR | Status: DC | PRN

## 2014-05-04 MED ORDER — SODIUM CHLORIDE 0.9 % IJ SOLN
3.0000 mL | Freq: Two times a day (BID) | INTRAMUSCULAR | Status: DC
  Administered 2014-05-04 (×2): 3 mL via INTRAVENOUS

## 2014-05-04 MED ORDER — POTASSIUM CHLORIDE CRYS ER 20 MEQ PO TBCR
20.0000 meq | EXTENDED_RELEASE_TABLET | Freq: Once | ORAL | Status: AC
  Administered 2014-05-04: 20 meq via ORAL
  Filled 2014-05-04: qty 1

## 2014-05-04 MED ORDER — INSULIN ASPART PROT & ASPART (70-30 MIX) 100 UNIT/ML ~~LOC~~ SUSP
12.0000 [IU] | Freq: Every day | SUBCUTANEOUS | Status: DC
  Administered 2014-05-05: 12 [IU] via SUBCUTANEOUS
  Filled 2014-05-04: qty 10

## 2014-05-04 MED ORDER — SODIUM CHLORIDE 0.9 % IV SOLN: 250.0000 mL | INTRAVENOUS | Status: DC | PRN

## 2014-05-04 MED ORDER — MAGNESIUM SULFATE IN D5W 10-5 MG/ML-% IV SOLN
1.0000 g | Freq: Once | INTRAVENOUS | Status: AC
  Administered 2014-05-04: 1 g via INTRAVENOUS

## 2014-05-04 NOTE — Progress Notes (Signed)
[place] TEAM 1 - Stepdown/ICU TEAM Progress Note  Alison Bell JEH:[id number] DOB: 1945-08-22 DOA: 05/01/2014 PCP: [name], MD  Admit HPI / Brief Narrative: Alison Bell is a 68 y.o. WF PMHx Atrial fibrillation S/P (2) failed attempts of cardioversion, HTN, HLD, diabetes. Presenting after a fall with right hip pain. No preceding chest pain or headache, she just slipped. In the emergency room her heart rate was in the 150s-160s Patient denies any chest pains but reports shortness of breath now . Patient received 2 doses of Cardizem IV 10 mg and her heart rate continued to be in the 120s to 130s probably contributed to by pain. No nausea vomiting, no fever or chills. Her right hip workup including an x-ray and CT were both negative, MRI of the hip is still pending .   HPI/Subjective: 10/15  A./O. X4, negative CP, mild SOB, negative N./V. states only issue is right hip pain. States yesterday heart rate again began to speed up, so was transferred to 2 heart.   Assessment/Plan: Atrial fibrillation with RVR  -History of chronic atrial fibrillation status post 2 failed attempts at cardioversion  -Continue Cardizem drip  -Lopressor IV when necessary  -TSH is normal - Mg is ok, but will push to 2.0  -Chads2vasc score is at least 4 - on chronic Eliquis -Cardiology to start patient on Tikosyn, and if that fails cardioversion   Acute diastolic congestive heart failure  -Continue Lasix 40 mg daily -Strict in and out -Daily weight:- dry weight appears to be as low as 84kg, but typical weight is ~88kg  10/15 weight= 88.8 kg -Cardiac echo  Pending -Lasix 40 mg daily  Hypertension  -Continue Cardizem drip -Tikosyn 500 mcg  BID -Metoprolol IV 5 mg q 2 hour PRN HR > 100  Right hip pain with negative x-ray and CT  - MRI of the right hip negative -Motrin 600 mg TID with meals -Ice packs to affected area  -Morphine PRN severe pain -Early mobilization in the a.m. as soon as atrial  fibrillation controlled  Diabetes mellitus type 2 controled -Continue moderate SSI -hemoglobin A1c= 6.6 -Obtain lipid panel   Leukocytosis -Most likely reactive however if patient spikes a fever would panculture -Hold any antibiotics   Depression -Elavil 25 mg each bedtime   Code Status: FULL Family Communication: Husband present at time of exam Disposition Plan: Rate control    Consultants: Dr. Darlin Coco (cardiology) Dr. [name] (electrophysiology)    Procedure/Significant Events: 10/12 PCXR;: Cardiomegaly and pulmonary venous congestion.  -unchanged bilateral medial basilar opacities, Rt > Lt likely atelectasis. 10/12 DG right hip;. No acute findings.- Moderate degenerative change right hip 10/12 CT pelvis without contrast; No hip or pelvic fracture.- Moderate degenerative change right hip. 10/13 MRI Rt Hip; Small right hip joint effusion is nonspecific and likely incidental.      Culture NA  Antibiotics: N/A  DVT prophylaxis: Eliquis   Devices    LINES / TUBES:      Continuous Infusions: . sodium chloride 10 mL/hr at 05/04/14 0800  . diltiazem (CARDIZEM) infusion 10 mg/hr (05/04/14 0900)    Objective: VITAL SIGNS: Temp: 99.3 F (37.4 C) (10/15 1658) Temp Source: Oral (10/15 1658) BP: 84/60 mmHg (10/15 1658) Pulse Rate: 65 (10/15 1658) SPO2; FIO2:   Intake/Output Summary (Last 24 hours) at 05/04/14 1745 Last data filed at 05/04/14 1700  Gross per 24 hour  Intake 1245.83 ml  Output   1050 ml  Net 195.83 ml  Exam: General: A./O. x4, NAD No acute respiratory distress Lungs: Clear to auscultation bilaterally without wheezes or crackles Cardiovascular: Irregular irregular rhythm and rate without murmur gallop or rub normal S1 and S2 Abdomen: Nontender, nondistended, soft, bowel sounds positive, no rebound, no ascites, no appreciable mass Extremities: No significant cyanosis, clubbing, or edema bilateral lower  extremities, pain to palpation over the right greater trochanteric process to palpation  Data Reviewed: Basic Metabolic Panel:  Recent Labs Lab 05/01/14 2144 05/02/14 0107 05/02/14 2050 05/04/14 0444  NA 133* 132* 134* 135*  K 4.1 4.1 4.2 3.9  CL 89* 90* 92* 94*  CO2 20 24 27 26   GLUCOSE 217* 221* 213* 281*  BUN 13 14 18  24*  CREATININE 0.92 0.94 1.13* 1.09  CALCIUM 9.7 9.3 9.5 9.5  MG  --   --  1.7 2.1   Liver Function Tests:  Recent Labs Lab 05/02/14 2050 05/04/14 0444  AST 17 15  ALT 17 14  ALKPHOS 67 77  BILITOT 0.5 0.3  PROT 7.9 7.2  ALBUMIN 3.6 3.2*   No results found for this basename: LIPASE, AMYLASE,  in the last 168 hours No results found for this basename: AMMONIA,  in the last 168 hours CBC:  Recent Labs Lab 05/01/14 2144 05/02/14 0107 05/02/14 2050 05/04/14 0441  WBC 17.8* 15.4* 11.7* 11.0*  NEUTROABS  --   --  9.4*  --   HGB 10.0* 9.5* 9.0* 7.8*  HCT 31.9* 30.2* 28.6* 24.8*  MCV 79.9 79.9 81.5 79.7  PLT 499* 454* 455* 401*   Cardiac Enzymes:  Recent Labs Lab 05/02/14 0107 05/02/14 0718 05/02/14 1301  TROPONINI <0.30 <0.30 <0.30   BNP (last 3 results) No results found for this basename: PROBNP,  in the last 8760 hours CBG:  Recent Labs Lab 05/03/14 1610 05/03/14 2200 05/04/14 0816 05/04/14 1230 05/04/14 1702  GLUCAP 245* 265* 268* 174* 301*    Recent Results (from the past 240 hour(s))  URINE CULTURE     Status: None   Collection Time    05/02/14 12:30 AM      Result Value Ref Range Status   Specimen Description URINE, RANDOM   Final   Special Requests NONE   Final   Culture  Setup Time     Final   Value: 05/02/2014 05:05     Performed at [facility] Count     Final   Value: NO GROWTH     Performed at [facility]   Culture     Final   Value: NO GROWTH     Performed at [facility]   Report Status 05/03/2014 FINAL   Final  MRSA PCR SCREENING     Status: None   Collection Time     05/02/14  4:47 PM      Result Value Ref Range Status   MRSA by PCR NEGATIVE  NEGATIVE Final   Comment:            The GeneXpert MRSA Assay (FDA     approved for NASAL specimens     only), is one component of a     comprehensive MRSA colonization     surveillance program. It is not     intended to diagnose MRSA     infection nor to guide or     monitor treatment for     MRSA infections.     Studies:  Recent x-ray studies have been reviewed in detail by  the Attending Physician  Scheduled Meds:  Scheduled Meds: . apixaban  5 mg Oral BID  . atorvastatin  10 mg Oral Daily  . dofetilide  500 mcg Oral BID  . famotidine  20 mg Oral BID  . folic acid  1 mg Oral Daily  . furosemide  40 mg Oral Daily  . ibuprofen  600 mg Oral TID  . insulin aspart  0-9 Units Subcutaneous TID WC  . insulin aspart protamine- aspart  12 Units Subcutaneous Q supper  . insulin aspart protamine- aspart  20 Units Subcutaneous Q breakfast  . loratadine  10 mg Oral Daily  . multivitamin with minerals  1 tablet Oral Daily  . potassium chloride SA  20 mEq Oral Daily  . predniSONE  2.5 mg Oral Daily  . sertraline  100 mg Oral Daily  . sodium chloride  3 mL Intravenous Q12H    Time spent on care of this patient: 40 mins   Allie Bossier , MD   [facility] Office  [phone] Pager [phone]  On-Call/Text Page:      [email or web address]      password [email or web address]  If 7PM-7AM, please contact night-coverage [email or web address] Password [email or web address] 05/04/2014, 5:45 PM   LOS: 3 days

## 2014-05-04 NOTE — Care Management Note (Addendum)
    Page 1 of 1   05/04/2014     2:24:44 PM CARE MANAGEMENT NOTE 05/04/2014  Patient:  Alison Bell, Alison Bell   Account Number:  [id number]  Date Initiated:  05/03/2014  Documentation initiated by:  Marvetta Gibbons  Subjective/Objective Assessment:   Pt admitted with afib and fall     Action/Plan:   PTA pt lived at home with spouse PT eval pending- will follow for recommendations   Anticipated DC Date:  05/05/2014   Anticipated DC Plan:        DC Planning Services  CM consult  Medication Assistance      Choice offered to / List presented to:             Status of service:  Completed, signed off Medicare Important Message given?  YES (If response is "NO", the following Medicare IM given date fields will be blank) Date Medicare IM given:  05/04/2014 Medicare IM given by:  Elissa Hefty Date Additional Medicare IM given:   Additional Medicare IM given by:    Discharge Disposition:    Per UR Regulation:  Reviewed for med. necessity/level of care/duration of stay  If discussed at [place] of Stay Meetings, dates discussed:    Comments:  10/15 1312 debbie Amardeep Beckers rn,bsn pt has 66.80 per month copay for tikosyn. spoke w pt and husband. they may have trouble w copay. placed tikosyn pt assist form on shadow chart. pt's [facility] not carry [facility] but [facility] [address] [facility] in stock and [facility] in [place]. will need prescription for 7days of tikosyn to get here at [facility] [address].

## 2014-05-04 NOTE — Progress Notes (Addendum)
Pharmacy Consult for Dofetilide (Tikosyn) Initiation  Admit Complaint: 68 y.o. female admitted 05/01/2014 with atrial fibrillation to be initiated on dofetilide.   Assessment:  Patient Exclusion Criteria: If any screening criteria checked as "Yes", then  patient  should NOT receive dofetilide until criteria item is corrected. If "Yes" please indicate correction plan.  YES  NO Patient  Exclusion Criteria Correction Plan  [x]  []  Baseline QTc interval is greater than or equal to 440 msec. IF above YES box checked dofetilide contraindicated unless patient has ICD; then may proceed if QTc 500-550 msec or with known ventricular conduction abnormalities may proceed with QTc 550-600 msec. QTc =  492 on EKG Dr. [name] aware, QTC < 500  []  [x]  Magnesium level is less than 1.8 mEq/l : Last magnesium:  Lab Results  Component Value Date   MG 2.1 05/04/2014         [x]  []  Potassium level is less than 4 mEq/l : Last potassium:  Lab Results  Component Value Date   K 3.9 05/04/2014       Giving extra 20 meq of KCl this AM in addition to scheduled 20 meq  []  [x]  Patient is known or suspected to have a digoxin level greater than 2 ng/ml: No results found for this basename: DIGOXIN      []  [x]  Creatinine clearance less than 20 ml/min (calculated using Cockcroft-Gault, actual body weight and serum creatinine): Estimated Creatinine Clearance: 49.7 ml/min (by C-G formula based on Cr of 1.09).    [x]  []  Patient has received drugs known to prolong the QT intervals within the last 48 hours(phenothiazines, tricyclics or tetracyclic antidepressants, erythromycin, H-1 antihistamines, cisapride, fluoroquinolones, azithromycin). Drugs not listed above may have an, as yet, undetected potential to prolong the QT interval, updated information on QT prolonging agents is available at this website:QT prolonging agents Last amitriptyline dose 10/13 at 2147. -- On cetirizine PTA, will need to d/c at discharge.  []  [x]   Patient received a dose of hydrochlorothiazide (Oretic) alone or in any combination including triamterene (Dyazide, Maxzide) in the last 48 hours.   []  [x]  Patient received a medication known to increase dofetilide plasma concentrations prior to initial dofetilide dose:    Trimethoprim (Primsol, Proloprim) in the last 36 hours   Verapamil (Calan, Verelan) in the last 36 hours or a sustained release dose in the last 72 hours   Megestrol (Megace) in the last 5 days    Cimetidine (Tagamet) in the last 6 hours   Ketoconazole (Nizoral) in the last 24 hours   Itraconazole (Sporanox) in the last 48 hours    Prochlorperazine (Compazine) in the last 36 hours    []  [x]  Patient is known to have a history of torsades de pointes; congenital or acquired long QT syndromes.   []  [x]  Patient has received a Class 1 antiarrhythmic with less than 2 half-lives since last dose. (Disopyramide, Quinidine, Procainamide, Lidocaine, Mexiletine, Flecainide, Propafenone)   []  [x]  Patient has received amiodarone therapy in the past 3 months or amiodarone level is greater than 0.3 ng/ml.    Patient has been appropriately anticoagulated with Eliquis.  Ordering provider was confirmed at [email or web address] if they are not listed on the [facility] [employer] list.  Goal of Therapy:  Follow renal function, electrolytes, potential drug interactions, and dose adjustment. Provide education and 1 week supply at discharge.  Plan:  1. Physician selected initial dose within range recommended for patients level of renal function will monitor for response.: (spoke to Dr.  Allred, wishes to proceed with 500 mcg dose although her CrCl is ~ 50 ml/min.  Will monitor)  [facility] One Calculated CrCl  Dose q12h  [x]  > 60 ml/min 500 mcg  []  40-60 ml/min 250 mcg  []  20-40 ml/min 125 mcg   2. Follow up QTc after the first 5 doses, renal function, electrolytes (K & Mg) daily x 3     days, dose adjustment, success of initiation and  facilitate 1 week discharge supply as clinically indicated.  3. Initiate Tikosyn education video (Call [phone] and ask for video # [phone]).  4. Place Enrollment Form on the chart for discharge supply of dofetilide.   Uvaldo Rising, BCPS  Clinical Pharmacist Pager [phone]  05/04/2014 10:57 AM   Addendum - I have spoken to the patient and her husband about the necessity of stopping Elavil and Zyrtec while taking Tikosyn.  She is agreeable to this plan, and plans to discuss alternatives with her primary care MD after discharge.  Uvaldo Rising, BCPS  Clinical Pharmacist Pager [phone]  05/04/2014 1:48 PM

## 2014-05-04 NOTE — Discharge Instructions (Signed)

## 2014-05-04 NOTE — Consult Note (Signed)
ELECTROPHYSIOLOGY CONSULT NOTE    Patient ID: Lai Hendriks Porada MRN: [id number], DOB/AGE: 1946/02/27 68 y.o.  Admit date: 05/01/2014 Date of Consult: 05-04-14  Primary Physician: [name], MD Primary Cardiologist: [name] Electrophysiologist: Maleta Pacha  Reason for Consultation: atrial fibrillation  HPI:  Brenae Lasecki Stefanko is a 68 y.o. female with a past medical history significant for persistent atrial fibrillation, diabetes, hypertension, and hyperlipidemia. She has a longstanding history of atrial fibrillation and had previously been maintained on Sotalol.  Up-titration was limited by prolonged QTc interval.  She underwent PVI in April of 2014 and did well until off AAD until 10/2013 when she had recurrent atrial fibrillation and underwent DCCV.  Her Sotalol was resumed at that time but she had symptoms of fatigue and exercise intolerance and was subsequently discontinued.  She checks her pulse on a regular basis at home and feels that she has mostly been in SR.   She had a mechanical fall on 05-02-14 and came to the ER for evaluation.  She was found to be in atrial fibrillation with a ventricular rate of 150-160.  She has been placed on IV Cardizem with improvement in rate control.   Echo 2-14 demonstrated EF 45-50%,  LA 39.  Repeat echo pending this admission.   She currently denies chest pain, shortness of breath, palpitations, dizziness, syncope, or pre-syncope.  She is still having moderate right hip pain.  ROS is otherwise negative.   EP has been asked to evaluate for treatment options.   Past Medical History  Diagnosis Date  . Diabetes mellitus without complication   . Persistent atrial fibrillation 08/25/2012  . HTN (hypertension) 08/25/2012  . Hyperlipidemia 08/25/2012  . Depression   . Shortness of breath   . GERD (gastroesophageal reflux disease)   . LV dysfunction, EF 45-50% due to a. fib per echo 08/27/12  08/28/2012  . Atrial enlargement, left      Surgical History:  Past  Surgical History  Procedure Laterality Date  . Tubal ligation    . Tee without cardioversion N/A 09/06/2012    Procedure: TRANSESOPHAGEAL ECHOCARDIOGRAM (TEE);  Surgeon: [name], MD;  Location: [facility] ENDOSCOPY;  Service: Cardiovascular;  Laterality: N/A;  . Cardioversion N/A 09/06/2012    Procedure: CARDIOVERSION;  Surgeon: [name], MD;  Location: [facility] ENDOSCOPY;  Service: Cardiovascular;  Laterality: N/A;  . Tee without cardioversion N/A 11/01/2012    Procedure: TRANSESOPHAGEAL ECHOCARDIOGRAM (TEE);  Surgeon: [name], MD;  Location: [place];  Service: Cardiovascular;  Laterality: N/A;  . Cardioversion N/A 01/07/2013    Procedure: CARDIOVERSION;  Surgeon: [name], MD;  Location: [facility] ENDOSCOPY;  Service: Cardiovascular;  Laterality: N/A;  . Atrial fibrillation ablation  11/02/12    PVI by Dr [name]  . Cardioversion N/A 11/14/2013    Procedure: CARDIOVERSION;  Surgeon: [name], MD;  Location: [facility] ENDOSCOPY;  Service: Cardiovascular;  Laterality: N/A;     Prescriptions prior to admission  Medication Sig Dispense Refill  . amitriptyline (ELAVIL) 25 MG tablet Take 25 mg by mouth at bedtime.      Marland Kitchen apixaban (ELIQUIS) 5 MG TABS tablet Take 1 tablet (5 mg total) by mouth 2 (two) times daily.  180 tablet  4  . atorvastatin (LIPITOR) 10 MG tablet Take 10 mg by mouth daily.      . cetirizine (ZYRTEC) 10 MG tablet Take 10 mg by mouth daily.      . CHOLECALCIFEROL PO Take 1 tablet by mouth daily.      Marland Kitchen  diltiazem (CARTIA XT) 120 MG 24 hr capsule Take 120 mg by mouth daily.      . famotidine (PEPCID) 20 MG tablet Take 20 mg by mouth 2 (two) times daily.      . folic acid (FOLVITE) 1 MG tablet Take 1 mg by mouth daily.      . furosemide (LASIX) 40 MG tablet Take 40 mg by mouth daily.      Marland Kitchen HYDROcodone-acetaminophen (NORCO/VICODIN) 5-325 MG per tablet Take 1 tablet by mouth every 6 (six) hours as needed.      . metFORMIN (GLUCOPHAGE) 1000 MG tablet Take 1,000 mg by  mouth 2 (two) times daily with a meal.      . Methotrexate Sodium, PF, 250 MG/10ML SOLN Inject 0.6 mLs as directed every 7 (seven) days. Thursdays      . Multiple Vitamin (MULTIVITAMIN WITH MINERALS) TABS Take 1 tablet by mouth daily.      Marland Kitchen NOVOLIN 70/30 RELION (70-30) 100 UNIT/ML injection Inject 40 units in the morning and 25 units at night      . potassium chloride SA (K-DUR,KLOR-CON) 20 MEQ tablet Take 1 tablet (20 mEq total) by mouth daily.  30 tablet  6  . predniSONE (DELTASONE) 5 MG tablet Take 2.5 mg by mouth daily.       . sertraline (ZOLOFT) 100 MG tablet Take 100 mg by mouth daily.      Marland Kitchen zolpidem (AMBIEN) 10 MG tablet Take 10 mg by mouth at bedtime.         Inpatient Medications:  . apixaban  5 mg Oral BID  . atorvastatin  10 mg Oral Daily  . famotidine  20 mg Oral BID  . folic acid  1 mg Oral Daily  . furosemide  40 mg Oral Daily  . ibuprofen  600 mg Oral TID  . insulin aspart  0-9 Units Subcutaneous TID WC  . insulin aspart protamine- aspart  12 Units Subcutaneous Q supper  . insulin aspart protamine- aspart  20 Units Subcutaneous Q breakfast  . loratadine  10 mg Oral Daily  . multivitamin with minerals  1 tablet Oral Daily  . potassium chloride SA  20 mEq Oral Daily  . predniSONE  2.5 mg Oral Daily  . sertraline  100 mg Oral Daily    Allergies:  Allergies  Allergen Reactions  . Benicar [Olmesartan] Anaphylaxis  . Other Itching and Swelling    White meat chicken causes hands and feet to itch and swell.  . Latex Hives    History   Social History  . Marital Status: Married    Spouse Name: N/A    Number of Children: N/A  . Years of Education: N/A   Occupational History  . Not on file.   Social History Main Topics  . Smoking status: Former Smoker    Quit date: 07/21/1994  . Smokeless tobacco: Never Used  . Alcohol Use: No  . Drug Use: No  . Sexual Activity: Yes   Other Topics Concern  . Not on file   Social History Narrative   Lives in [place]  with spouse.  Retired           Family History  Problem Relation Age of Onset  . Heart attack Mother   . Diabetes type II Father     Physical Exam: Filed Vitals:   05/04/14 0100 05/04/14 0200 05/04/14 0300 05/04/14 0818  BP: 113/44 137/77 135/64 137/77  Pulse: 101 98 79 90  Temp:  98.1 F (36.7 C) 97.5 F (36.4 C)  TempSrc:   Oral Oral  Resp: 23 24 14 17   Height:      Weight:   195 lb 12.3 oz (88.8 kg)   SpO2: 93% 90% 96% 97%    GEN- The patient is overweight appearing, alert and oriented x 3 today.   Head- normocephalic, atraumatic Eyes-  Sclera clear, conjunctiva pink Ears- hearing intact Oropharynx- clear Neck- supple, Lungs- Clear to ausculation bilaterally, normal work of breathing Heart- irregular rate and rhythm  GI- soft, NT, ND, + BS Extremities- no clubbing, cyanosis, or edema, groin is without hematoma/ bruit MS- no significant deformity or atrophy Skin- no rash or lesion Psych- euthymic mood, full affect Neuro- strength and sensation are intact    Labs:   Lab Results  Component Value Date   WBC 11.0* 05/04/2014   HGB 7.8* 05/04/2014   HCT 24.8* 05/04/2014   MCV 79.7 05/04/2014   PLT 401* 05/04/2014    Recent Labs Lab 05/04/14 0444  NA 135*  K 3.9  CL 94*  CO2 26  BUN 24*  CREATININE 1.09  CALCIUM 9.5  PROT 7.2  BILITOT 0.3  ALKPHOS 77  ALT 14  AST 15  GLUCOSE 281*   Lab Results  Component Value Date   TROPONINI <0.30 05/02/2014     Radiology/Studies: Dg Chest 1 View 05/01/2014   CLINICAL DATA:  Post fall.  Initial encounter.  EXAM: CHEST - 1 VIEW  COMPARISON:  01/06/2013; 08/25/2012  FINDINGS: Grossly unchanged enlarged cardiac silhouette and mediastinal contours. Bilateral medial basilar heterogeneous opacities are unchanged, right greater than left. Mild pulmonary venous congestion without frank evidence of edema. No pleural effusion pneumothorax. No acute osseus abnormalities.  IMPRESSION: 1. Cardiomegaly and pulmonary venous  congestion without definite acute cardiopulmonary disease. 2. Grossly unchanged bilateral medial basilar opacities, right greater than left, likely atelectasis.   Electronically Signed   By: Sandi Mariscal M.D.   On: 05/01/2014 22:32   Dg Hip Complete Right 05/01/2014   CLINICAL DATA:  Post fall attempting to sit on bed, now with right hip pain. Initial encounter.  EXAM: RIGHT HIP - COMPLETE 2+ VIEW  COMPARISON:  None.  FINDINGS: No definite displaced right-sided hip fracture. Moderate degenerative change the right hip with joint space loss, subchondral sclerosis osteophytosis. No evidence of avascular necrosis. Regional soft tissues appear normal. No radiopaque foreign body.  IMPRESSION: 1. No acute findings. 2. Moderate degenerative change of the right hip.   Electronically Signed   By: Sandi Mariscal M.D.   On: 05/01/2014 22:25   Ct Pelvis Wo Contrast 05/01/2014   CLINICAL DATA:  Post fall now with right hip pain. Evaluate for hip fracture. Initial encounter.  EXAM: CT PELVIS WITHOUT CONTRAST  TECHNIQUE: Multidetector CT imaging of the pelvis was performed following the standard protocol without intravenous contrast.  COMPARISON:  Right hip radiographs -05/01/2014  FINDINGS: No hip or pelvic fracture. There is moderate degenerative change the right hip with joint space loss, subchondral sclerosis and osteophytosis. There is mild degenerative change of the pubic symphysis. There is very mild degenerative change of the left hip.  Mild to moderate DDD of L5-S1 with disc space height loss, endplate irregularity and sclerosis.  Vascular calcifications. Scattered minimal colonic diverticulosis without evidence of diverticulitis within the imaged pelvis. Moderate colonic stool burden without evidence of enteric obstruction. Normal noncontrast appearance of the pelvic organs for age. No discrete adnexal lesion. Normal appearance the urinary bladder given degree distention.  Regional soft tissues appear normal.  No  radiopaque foreign body.  IMPRESSION: 1. No hip or pelvic fracture. 2. Moderate degenerative change of the right hip.   Electronically Signed   By: Sandi Mariscal M.D.   On: 05/01/2014 23:48   EKG: atrial fibrillation, RBBB, QTc <500  TELEMETRY: atrial fibrillation, ventricular rates 90-110's  A/P  1. Persistent atrial fibrillation The patient has a h/o afib with symptoms.  She has failed medical therapy with sotalol. Therapeutic strategies for afib including rate and rhythm control were discussed in detail with the patient today. Risk, benefits, and alternatives to [place] were discussed today.  She would like to proceed. I will therefore initiate tikosyn at this time. Will plan to convert to oral diltiazem tomorrow. If she does not convert to sinus, will likely require cardioversion tomorrow She reports compliance with eliquis without interruption.  Chads2vasc score is at least 5.  2. Hypertensive cariovascular disease Stable No change required today  3. Nonischemic CM Repeat echo Rate control afib  4. Obesity Body mass index is 35.8 kg/(m^2). Weight reduction is advised

## 2014-05-04 NOTE — Progress Notes (Signed)
  Echocardiogram 2D Echocardiogram has been performed.  Alison Bell 05/04/2014, 4:50 PM

## 2014-05-04 NOTE — Progress Notes (Signed)
Cardiology Progress Note Called to bedside due to runs of polymorphic Vtach. Patient symptomatic. Pulse of ~60-80. Occurring in the setting of recent Tikosyn start. Also concurrently on dilt gtt at 10 mg/hr QTc currently actually only 470 ms. Mg this AM 2.1; given additional 2 grams IV K okay by Istat. Dilt gtt stopped. Family notified. Plan for isuprel to get heart rate up to > 80. If continues to have runs of VT, will need to overdrive pace. Pads in place. Transfer to CCU level of care.

## 2014-05-04 NOTE — Progress Notes (Signed)
PT Cancellation Note  Patient Details Name: Alison Bell MRN: [id number] DOB: [birth year]   Cancelled Treatment:    Reason Eval/Treat Not Completed: Fatigue/lethargy limiting ability to participate. Pt reports that she has not been able to sleep and just laid down to rest. Pt educated pt on benefits of OOB to chair, and pt agreeable, however ECHO entered and needed pt in bed for procedure. Will continue to follow.   Rolinda Roan 05/04/2014, 4:15 PM  Rolinda Roan, PT, DPT Acute Rehabilitation Services Pager: [phone]

## 2014-05-04 NOTE — Progress Notes (Addendum)
Tikosyn given at 2100. Around 2230 Patient having several runs of Vtach. Patient now symptomatic. EKG obtained, cardiology called and zoll and pads attached to patient. Patient went unconscious once briefly for about 10 seconds. Did not shock at that time. Stopped cardizem drip. Cardiology at bedside at this time. Given 2g Mag. Dr Elias Else ordered Isuprel drip.  Husband notified and on the way in

## 2014-05-05 DIAGNOSIS — I472 Ventricular tachycardia: Secondary | ICD-10-CM

## 2014-05-05 LAB — BASIC METABOLIC PANEL
Anion gap: 20 — ABNORMAL HIGH (ref 5–15)
BUN: 25 mg/dL — ABNORMAL HIGH (ref 6–23)
CO2: 21 mEq/L (ref 19–32)
Calcium: 9.4 mg/dL (ref 8.4–10.5)
Chloride: 95 mEq/L — ABNORMAL LOW (ref 96–112)
Creatinine, Ser: 1.18 mg/dL — ABNORMAL HIGH (ref 0.50–1.10)
GFR calc Af Amer: 53 mL/min — ABNORMAL LOW (ref >90)
GFR calc non Af Amer: 46 mL/min — ABNORMAL LOW (ref >90)
Glucose, Bld: 348 mg/dL — ABNORMAL HIGH (ref 70–99)
Potassium: 3.6 mEq/L — ABNORMAL LOW (ref 3.7–5.3)
Sodium: 136 mEq/L — ABNORMAL LOW (ref 137–147)

## 2014-05-05 LAB — GLUCOSE, CAPILLARY
Glucose-Capillary: 252 mg/dL — ABNORMAL HIGH (ref 70–99)
Glucose-Capillary: 295 mg/dL — ABNORMAL HIGH (ref 70–99)
Glucose-Capillary: 225 mg/dL — ABNORMAL HIGH (ref 70–99)
Glucose-Capillary: 245 mg/dL — ABNORMAL HIGH (ref 70–99)
Glucose-Capillary: 175 mg/dL — ABNORMAL HIGH (ref 70–99)

## 2014-05-05 LAB — MAGNESIUM: Magnesium: 2.2 mg/dL (ref 1.5–2.5)

## 2014-05-05 MED ORDER — POTASSIUM CHLORIDE CRYS ER 20 MEQ PO TBCR
40.0000 meq | EXTENDED_RELEASE_TABLET | Freq: Once | ORAL | Status: AC
  Administered 2014-05-05: 40 meq via ORAL
  Filled 2014-05-05: qty 2

## 2014-05-05 MED ORDER — MENTHOL 3 MG MT LOZG
1.0000 | LOZENGE | OROMUCOSAL | Status: DC | PRN
  Filled 2014-05-05: qty 9

## 2014-05-05 MED ORDER — HYDROCOD POLST-CHLORPHEN POLST 10-8 MG/5ML PO LQCR
5.0000 mL | Freq: Four times a day (QID) | ORAL | Status: DC | PRN
  Administered 2014-05-05 – 2014-05-06 (×2): 5 mL via ORAL
  Filled 2014-05-05 (×2): qty 5

## 2014-05-05 NOTE — Progress Notes (Addendum)
Subjective: Sleeping but rouses,  Says she had a "hard night" She had torsades requiring isuprel overnight  Objective: Vital signs in last 24 hours: Temp:  [97.4 F (36.3 C)-99.3 F (37.4 C)] 97.4 F (36.3 C) (10/16 0300) Pulse Rate:  [64-101] 74 (10/16 0700) Resp:  [15-34] 20 (10/16 0700) BP: (84-175)/(33-125) 143/125 mmHg (10/16 0700) SpO2:  [87 %-100 %] 99 % (10/16 0700) Weight:  [195 lb 12.3 oz (88.8 kg)] 195 lb 12.3 oz (88.8 kg) (10/16 0300) Weight change: 0 lb (0 kg) Last BM Date: 05/04/14 Intake/Output from previous day: 10/15 0701 - 10/16 0700 In: 1526.5 [P.O.:800; I.V.:601.5; IV Piggyback:125] Out: 1100 [Urine:1100] Intake/Output this shift:    PE: General:Pleasant affect, NAD Skin:Warm and dry, brisk capillary refill HEENT:normocephalic, sclera clear, mucus membranes moist Neck:supple, no JVD, no bruits  Heart:S1S2 RRR without murmur, gallup, rub or click Lungs:clear without rales, rhonchi, or wheezes ERX:VQMG, non tender, + BS, do not palpate liver spleen or masses Ext:no lower ext edema, 2+ pedal pulses, 2+ radial pulses Neuro:alert and oriented, MAE, follows commands, + facial symmetry   Lab Results:  Recent Labs  05/02/14 2050 05/04/14 0441  WBC 11.7* 11.0*  HGB 9.0* 7.8*  HCT 28.6* 24.8*  PLT 455* 401*   BMET  Recent Labs  05/04/14 0444 05/05/14 0250  NA 135* 136*  K 3.9 3.6*  CL 94* 95*  CO2 26 21  GLUCOSE 281* 348*  BUN 24* 25*  CREATININE 1.09 1.18*  CALCIUM 9.5 9.4    Recent Labs  05/02/14 1301  TROPONINI <0.30    Lab Results  Component Value Date   CHOL 153 05/02/2014   HDL 56 05/02/2014   LDLCALC 74 05/02/2014   TRIG 116 05/02/2014   CHOLHDL 2.7 05/02/2014   Lab Results  Component Value Date   HGBA1C 6.6* 05/02/2014     Lab Results  Component Value Date   TSH 0.628 05/03/2014    Hepatic Function Panel  Recent Labs  05/04/14 0444  PROT 7.2  ALBUMIN 3.2*  AST 15  ALT 14  ALKPHOS 77  BILITOT  0.3    Recent Labs  05/02/14 2050  CHOL 153   No results found for this basename: PROTIME,  in the last 72 hours     Studies/Results: No results found.  Medications: I have reviewed the patient's current medications. Scheduled Meds: . apixaban  5 mg Oral BID  . atorvastatin  10 mg Oral Daily  . famotidine  20 mg Oral BID  . folic acid  1 mg Oral Daily  . furosemide  40 mg Oral Daily  . ibuprofen  600 mg Oral TID  . insulin aspart  0-9 Units Subcutaneous TID WC  . insulin aspart protamine- aspart  12 Units Subcutaneous Q supper  . insulin aspart protamine- aspart  20 Units Subcutaneous Q breakfast  . loratadine  10 mg Oral Daily  . magnesium sulfate      . multivitamin with minerals  1 tablet Oral Daily  . potassium chloride SA  20 mEq Oral Daily  . predniSONE  2.5 mg Oral Daily   Continuous Infusions: . sodium chloride 10 mL/hr at 05/05/14 0000  . isoproterenol (ISUPREL) infusion Stopped (05/05/14 0550)   PRN Meds:.acetaminophen, acetaminophen, morphine injection, oxyCODONE, zolpidem  Assessment/Plan: 1. Persistent atrial fibrillation - converted to SR The patient has a h/o afib with symptoms. She has failed medical therapy with sotalol.  Therapeutic strategies for afib including rate and  rhythm control were discussed in detail with the patient today. Risk, benefits, and alternatives to [place] were discussed today. She would like to proceed. I will therefore initiate tikosyn at this time. Now stopped secondary to VT- runs, began 1.5 hours after first dose tikosyn Will plan to convert to oral diltiazem tomorrow.  If she does not convert to sinus, will likely require cardioversion tomorrow - now SR She reports compliance with eliquis without interruption. Chads2vasc score is at least 5.   2. Hypertensive cardiovascular disease  HTN elevated after IV isuprel during the night.  3. Nonischemic CM  Repeat echo --Normal biventricular size and systolic function. Grade  2 diastolic dysfunction. Mild mitral and tricuspid regurgitation. Normal RVSP. Mild right atrial and moderate left atrial dilatation  Rate control afib - now SR  4. Obesity  Body mass index is 35.8 kg/(m^2).  Weight reduction is advised  5. NSVT- appears torsades- pt did have unresponsiveness for 10 sec.  Qtc was 470 ms at time. 2 gms Mg+ given, Dilt drip stopped.  Pt transferred to ICU   LOS: 4 days   Time spent with pt. :20 minutes. [facility] R  Nurse Practitioner Certified Pager [phone] or after 5pm and on weekends call [phone] 05/05/2014, 7:51 AM   I have seen, examined the patient, and reviewed the above assessment and plan.  Changes to above are made where necessary.   The patient is now back in sinus.  She had torsades with tikosyn without major qt prolongation.  I would not try this medicine again.  Torsades is now resolved off of tikosyn and we have been able to stop isuprel. I think that we should observe another 24 hours off of AAD therapy and discharge tomorrow. I will follow-up with her in the office in the next few weeks to discuss other options including ablation. She could go to telemetry later today.  Co Sign: [name], MD 05/05/2014 9:26 AM

## 2014-05-05 NOTE — Progress Notes (Signed)
Inpatient Diabetes Program Recommendations  AACE/ADA: New Consensus Statement on Inpatient Glycemic Control (2013)  Target Ranges:  Prepandial:   less than 140 mg/dL      Peak postprandial:   less than 180 mg/dL (1-2 hours)      Critically ill patients:  140 - 180 mg/dL  Results for Alison Bell, Alison Bell (MRN [id number]) as of 05/05/2014 13:45  Ref. Range 05/04/2014 12:30 05/04/2014 17:02 05/04/2014 21:17 05/05/2014 08:16 05/05/2014 12:12  Glucose-Capillary Latest Range: 70-99 mg/dL 174 (H) 301 (H) 252 (H) 295 (H) 225 (H)   Consider increasing Novolog 70/30 closer to home dose.   Thank you  Raoul Pitch BSN, RN,CDE Inpatient Diabetes Coordinator [phone] (team pager)

## 2014-05-05 NOTE — Progress Notes (Signed)
Report received from [place] at 2345 and transferred pt to room 2H08. Took over patient care at 0000. Johnsie Cancel, RN

## 2014-05-06 ENCOUNTER — Encounter (HOSPITAL_COMMUNITY): Payer: Self-pay | Admitting: Physician Assistant

## 2014-05-06 ENCOUNTER — Other Ambulatory Visit: Payer: Self-pay | Admitting: Physician Assistant

## 2014-05-06 DIAGNOSIS — I472 Ventricular tachycardia: Secondary | ICD-10-CM

## 2014-05-06 DIAGNOSIS — D649 Anemia, unspecified: Secondary | ICD-10-CM

## 2014-05-06 DIAGNOSIS — I4721 Torsades de pointes: Secondary | ICD-10-CM

## 2014-05-06 LAB — CBC
HCT: 27 % — ABNORMAL LOW (ref 36.0–46.0)
Hemoglobin: 8.5 g/dL — ABNORMAL LOW (ref 12.0–15.0)
MCH: 25.4 pg — ABNORMAL LOW (ref 26.0–34.0)
MCHC: 31.5 g/dL (ref 30.0–36.0)
MCV: 80.6 fL (ref 78.0–100.0)
Platelets: 483 10*3/uL — ABNORMAL HIGH (ref 150–400)
RBC: 3.35 MIL/uL — ABNORMAL LOW (ref 3.87–5.11)
RDW: 19.9 % — ABNORMAL HIGH (ref 11.5–15.5)
WBC: 9.8 10*3/uL (ref 4.0–10.5)

## 2014-05-06 LAB — BASIC METABOLIC PANEL
Anion gap: 14 (ref 5–15)
BUN: 22 mg/dL (ref 6–23)
CO2: 23 mEq/L (ref 19–32)
Calcium: 9.2 mg/dL (ref 8.4–10.5)
Chloride: 103 mEq/L (ref 96–112)
Creatinine, Ser: 0.98 mg/dL (ref 0.50–1.10)
GFR calc Af Amer: 66 mL/min — ABNORMAL LOW (ref >90)
GFR calc non Af Amer: 57 mL/min — ABNORMAL LOW (ref >90)
Glucose, Bld: 172 mg/dL — ABNORMAL HIGH (ref 70–99)
Potassium: 4.3 mEq/L (ref 3.7–5.3)
Sodium: 140 mEq/L (ref 137–147)

## 2014-05-06 LAB — GLUCOSE, CAPILLARY
Glucose-Capillary: 180 mg/dL — ABNORMAL HIGH (ref 70–99)
Glucose-Capillary: 193 mg/dL — ABNORMAL HIGH (ref 70–99)

## 2014-05-06 LAB — MAGNESIUM: Magnesium: 2 mg/dL (ref 1.5–2.5)

## 2014-05-06 MED ORDER — IBUPROFEN 200 MG PO TABS: 600.0000 mg | ORAL_TABLET | Freq: Three times a day (TID) | ORAL | Status: DC | PRN

## 2014-05-06 MED ORDER — IBUPROFEN 600 MG PO TABS: 600.0000 mg | ORAL_TABLET | Freq: Three times a day (TID) | ORAL | Status: DC | PRN

## 2014-05-06 NOTE — Progress Notes (Signed)
Pt D/C'd home.  Alert and oriented x4.  No c/o pain.  Education given on diet, activity, meds, and follow-up care and instructions.  Pt verbalized understanding.  IV D/C'd.  Tele D/C'd.  Pt taken home by private vehicle by daughter.

## 2014-05-06 NOTE — Discharge Summary (Signed)
Discharge Summary   [name], MRN: [id number], DOB/AGE: [birth year] 68 y.o.  Admit date: 05/01/2014 Discharge date: 05/06/2014   Primary Care Physician:  Deland Pretty DAVIDSON   Primary Cardiologist:  Dr. Quay Burow  Primary Electrophysiologist:  Dr. [name]   Reason for Admission:  Atrial Fibrillation with RVR   Primary Discharge Diagnoses:  Principal Problem:   Atrial fibrillation with RVR, symptomatic - converted to NSR   Active Problems:   Torsades de pointes in setting of Tikosyn    Acute on chronic diastolic congestive heart failure   NICM (nonischemic cardiomyopathy) - EF 45-50% in 2014 due to AFib - EF normal by echo this admit   DM (diabetes mellitus)   HTN (hypertension)   Hyperlipidemia   Fall   Acute right hip pain   Anemia     Wt Readings from Last 3 Encounters:  05/06/14 195 lb 12.3 oz (88.8 kg)  01/26/14 186 lb 12.8 oz (84.732 kg)  11/17/13 184 lb (83.462 kg)    Secondary Discharge Diagnoses:   Past Medical History  Diagnosis Date  . Diabetes mellitus without complication   . Persistent atrial fibrillation 08/25/2012  . HTN (hypertension) 08/25/2012  . Hyperlipidemia 08/25/2012  . Depression   . Shortness of breath   . GERD (gastroesophageal reflux disease)   . LV dysfunction, EF 45-50% due to a. fib per echo 08/27/12  08/28/2012    a. Echo (10/15):  EF 55-60%, no RWMA, Gr 2 DD, mild MR, mod LAE, normal RVF, mild RAE, mild TR (LA 44 mm)  . Atrial enlargement, left   . Torsades de pointes     due to Tikosyn 04/2014      Allergies:    Allergies  Allergen Reactions  . Benicar [Olmesartan] Anaphylaxis  . Other Itching and Swelling    White meat chicken causes hands and feet to itch and swell.  Phyllis Ginger [Dofetilide]     Torsades after 1 dose of Tikosyn without significant QT prolongation  . Latex Hives      Procedures Performed This Admission:    None    Hospital Course:  Alison [place] is a 68 y.o. female with a hx of  persistent AFib s/p multiple DCCVs and PVI ablation with Dr. [name] in 11/3662, diastolic CHF, HTN, HL, DM2.  Last DCCV was 10/2013 that was unsuccessful.  However, she then reverted to NSR spontaneously.   Sotalol was stopped due to intolerance from symptomatic bradycardia in 4/15.  She presented to the hospital on the date of admission with R hip pain after sustaining a fall.  X-rays were negative for fracture. Pain was treated with medications prn and ice packs.  She was noted to be in AFib with RVR upon presentation and was admitted for rate control.  1.  Atrial Fibrillation with RVR  -  She was noted to be volume overloaded and given a dose of IV Lasix.  HR was better controlled on IV Diltiazem.  CEs remained negative.  She was initially admitted to the Hospitalist service and transferred to Cardiology.  She was seen by EP (Dr. [name]) and the decision was made to initiate Tikosyn for rhythm control.  However, she developed runs of polymorphic VTach/Torsades.  HR was in the 60-80s.  Diltiazem gtt was stopped.  IV Isuprel was used to increase her HR.  She converted to NSR.  She was eventually taken off of IV Isuprel the next day.  Dr. [name] recommended that Tikosyn not  be used ever again given onset of Torsades after one dose and no significant QT prolongation.     2.  Acute on Chronic Diastolic CHF  -  She was diuresed with one dose of IV Lasix and her usual dose of oral Lasix was continued.  FU echo this admission demonstrated normal LV function.   3.  R Hip pain s/p fall  -  Xray, CT, MRI all negative.  Patient was treated with a combination of NSAIDs, prn Morphine and ice packs.  Hip pain was resolved at DC.  4.  Anemia  -  Hemoglobin noted to drop from 10 >>> 7.8 (10/15).  Repeat Hgb improved at 8.5.  Patient denies melena, hematochezia.  Reviewed with Dr. Minus Breeding.  Suspect she had an element of blood loss from her injury.  Will plan on a repeat CBC early next week in the office to  continue to document stability.  Will also give her stool guaiac cards to screen for occult blood loss.   She was seen by Dr. Cristopher Peru this AM and noted to remain in NSR.  Hip pain is resolved.  She is felt stable for DC to home.  She will need close FU with Dr. [name] to discuss further options for AFib.    Discharge Vitals:   Blood pressure 119/47, pulse 89, temperature 98.4 F (36.9 C), temperature source Oral, resp. rate 18, height 5\' 2"  (1.575 m), weight 195 lb 12.3 oz (88.8 kg), SpO2 93.00%.   Labs:   Recent Labs  05/04/14 0441 05/06/14 1124  WBC 11.0* 9.8  HGB 7.8* 8.5*  HCT 24.8* 27.0*  MCV 79.7 80.6  PLT 401* 483*     Recent Labs  05/04/14 0444 05/05/14 0250 05/06/14 0420  NA 135* 136* 140  K 3.9 3.6* 4.3  CL 94* 95* 103  CO2 26 21 23   BUN 24* 25* 22  CREATININE 1.09 1.18* 0.98  CALCIUM 9.5 9.4 9.2  PROT 7.2  --   --   BILITOT 0.3  --   --   ALKPHOS 77  --   --   ALT 14  --   --   AST 15  --   --     Recent Labs  05/02/14 0107 05/02/14 0718 05/02/14 1301  TROPONINI <0.30 <0.30 <0.30      Lab Results  Component Value Date   CHOL 153 05/02/2014   HDL 56 05/02/2014   LDLCALC 74 05/02/2014   TRIG 116 05/02/2014     Lab Results  Component Value Date   TSH 0.628 05/03/2014     Diagnostic Procedures and Studies:  Dg Chest 1 View  05/01/2014      IMPRESSION: 1. Cardiomegaly and pulmonary venous congestion without definite acute cardiopulmonary disease. 2. Grossly unchanged bilateral medial basilar opacities, right greater than left, likely atelectasis.   Electronically Signed   By: Sandi Mariscal M.D.   On: 05/01/2014 22:32   Dg Hip Complete Right  05/01/2014     IMPRESSION: 1. No acute findings. 2. Moderate degenerative change of the right hip.   Electronically Signed   By: Sandi Mariscal M.D.   On: 05/01/2014 22:25   Ct Pelvis Wo Contrast  05/01/2014      IMPRESSION: 1. No hip or pelvic fracture. 2. Moderate degenerative change of the  right hip.   Electronically Signed   By: Sandi Mariscal M.D.   On: 05/01/2014 23:48   Mr Femur Right Wo Contrast  05/02/2014     IMPRESSION: Negative for fracture. As noted above, only T1 weighted imaging of the femur is provided but no abnormality is seen.  Small right hip joint effusion is nonspecific and likely incidental. It may be related to mild to moderate right hip osteoarthritis.   Electronically Signed   By: Inge Rise M.D.   On: 05/02/2014 16:10   Mr Hip Right Wo Contrast  05/02/2014      IMPRESSION: Negative for fracture. As noted above, only T1 weighted imaging of the femur is provided but no abnormality is seen.  Small right hip joint effusion is nonspecific and likely incidental. It may be related to mild to moderate right hip osteoarthritis.   Electronically Signed   By: Inge Rise M.D.   On: 05/02/2014 16:10     2D Echocardiogram 05/04/14:  Study Conclusions  - Left ventricle: The cavity size was normal. Systolic function was normal. The estimated ejection fraction was in the range of 55% to 60%. Wall motion was normal; there were no regional wall motion abnormalities. Features are consistent with a pseudonormal left ventricular filling pattern, with concomitant abnormal relaxation and increased filling pressure (grade 2 diastolic dysfunction). - Aortic valve: Trileaflet; normal thickness leaflets. There was no regurgitation. - Aortic root: The aortic root was normal in size. - Mitral valve: There was mild regurgitation. - Left atrium: The atrium was moderately dilated.  44 mm - Right ventricle: Systolic function was normal. - Right atrium: The atrium was mildly dilated. - Tricuspid valve: There was mild regurgitation. - Pulmonary arteries: Systolic pressure was within the normal range.  Impressions:  - Normal biventricular size and systolic function. Grade 2 diastolic dysfunction. Mild mitral and tricuspid regurgitation. Normal RVSP. Mild right atrial and  moderate left atrial dilatation.    Disposition:   Pt is being discharged home today in good condition.  Follow-up Plans & Appointments      Follow-up Information   Follow up with [name], MD On 05/19/2014. (at 2:30 pm)    Specialty:  Cardiology   Contact information:   [address] [place] [address] [phone]       Follow up with [facility] On 05/09/2014. (go to the lab on this day at any time for a CBC; you will be given stool cards to complete and send back)    Contact information:   [place] [id number] [phone]      Discharge Medications    Medication List    STOP taking these medications       amitriptyline 25 MG tablet  Commonly known as:  ELAVIL     CARTIA XT 120 MG 24 hr capsule  Generic drug:  diltiazem     sertraline 100 MG tablet  Commonly known as:  ZOLOFT      TAKE these medications       apixaban 5 MG Tabs tablet  Commonly known as:  ELIQUIS  Take 1 tablet (5 mg total) by mouth 2 (two) times daily.     atorvastatin 10 MG tablet  Commonly known as:  LIPITOR  Take 10 mg by mouth daily.     cetirizine 10 MG tablet  Commonly known as:  ZYRTEC  Take 10 mg by mouth daily.     CHOLECALCIFEROL PO  Take 1 tablet by mouth daily.     famotidine 20 MG tablet  Commonly known as:  PEPCID  Take 20  mg by mouth 2 (two) times daily.     folic acid 1 MG tablet  Commonly known as:  FOLVITE  Take 1 mg by mouth daily.     furosemide 40 MG tablet  Commonly known as:  LASIX  Take 40 mg by mouth daily.     HYDROcodone-acetaminophen 5-325 MG per tablet  Commonly known as:  NORCO/VICODIN  Take 1 tablet by mouth every 6 (six) hours as needed.     ibuprofen 200 MG tablet  Commonly known as:  ADVIL,MOTRIN  Take 3 tablets (600 mg total) by mouth every 8 (eight) hours as needed for mild pain or moderate pain.     metFORMIN 1000 MG tablet    Commonly known as:  GLUCOPHAGE  Take 1,000 mg by mouth 2 (two) times daily with a meal.     Methotrexate Sodium (PF) 250 MG/10ML Soln  Inject 0.6 mLs as directed every 7 (seven) days. Thursdays     multivitamin with minerals Tabs tablet  Take 1 tablet by mouth daily.     NOVOLIN 70/30 RELION (70-30) 100 UNIT/ML injection  Generic drug:  insulin NPH-regular Human  Inject 40 units in the morning and 25 units at night     potassium chloride SA 20 MEQ tablet  Commonly known as:  K-DUR,KLOR-CON  Take 1 tablet (20 mEq total) by mouth daily.     predniSONE 5 MG tablet  Commonly known as:  DELTASONE  Take 2.5 mg by mouth daily.     zolpidem 10 MG tablet  Commonly known as:  AMBIEN  Take 10 mg by mouth at bedtime.         Outstanding Labs/Studies  1. CBC on 05/09/2014 2. Stool guaiac cards - to be provided to patient at lab visit 05/09/14   Duration of Discharge Encounter: Greater than 30 minutes including physician and PA time.  Signed, Richardson Dopp, PA-C   05/06/2014 12:23 PM    EP Attending  Patient seen and examined. See my note for details. [place] for discharge  Mikle Bosworth.D.

## 2014-05-06 NOTE — Progress Notes (Signed)
ELECTROPHYSIOLOGY ROUNDING NOTE    Patient Name: Alison [place] Date of Encounter: 05/06/2014    SUBJECTIVE:Patient feels well this morning.  No chest pain or shortness of breath. Hip pain has resolved.   TELEMETRY: Reviewed telemetry pt in sinus rhythm, no further ventricular ectopy Filed Vitals:   05/05/14 0800 05/05/14 1313 05/05/14 2117 05/06/14 0523  BP: 140/64 124/47 133/60 119/47  Pulse: 71 77 89 89  Temp: 97.4 F (36.3 C) 98.7 F (37.1 C) 97.7 F (36.5 C) 98.4 F (36.9 C)  TempSrc: Oral Axillary Oral Oral  Resp: 30 20 18 18   Height:      Weight:    195 lb 12.3 oz (88.8 kg)  SpO2: 100% 96% 95% 93%    Intake/Output Summary (Last 24 hours) at 05/06/14 0700 Last data filed at 05/05/14 0900  Gross per 24 hour  Intake     20 ml  Output    125 ml  Net   -105 ml    CURRENT MEDICATIONS: . apixaban  5 mg Oral BID  . atorvastatin  10 mg Oral Daily  . famotidine  20 mg Oral BID  . folic acid  1 mg Oral Daily  . furosemide  40 mg Oral Daily  . ibuprofen  600 mg Oral TID  . insulin aspart  0-9 Units Subcutaneous TID WC  . insulin aspart protamine- aspart  12 Units Subcutaneous Q supper  . insulin aspart protamine- aspart  20 Units Subcutaneous Q breakfast  . loratadine  10 mg Oral Daily  . multivitamin with minerals  1 tablet Oral Daily  . potassium chloride SA  20 mEq Oral Daily  . predniSONE  2.5 mg Oral Daily    LABS: Basic Metabolic Panel:  Recent Labs  05/05/14 0250 05/06/14 0420  NA 136* 140  K 3.6* 4.3  CL 95* 103  CO2 21 23  GLUCOSE 348* 172*  BUN 25* 22  CREATININE 1.18* 0.98  CALCIUM 9.4 9.2  MG 2.2 2.0   Liver Function Tests:  Recent Labs  05/04/14 0444  AST 15  ALT 14  ALKPHOS 77  BILITOT 0.3  PROT 7.2  ALBUMIN 3.2*   CBC:  Recent Labs  05/04/14 0441  WBC 11.0*  HGB 7.8*  HCT 24.8*  MCV 79.7  PLT 401*    Radiology/Studies:  Dg Chest 1 View 05/01/2014   CLINICAL DATA:  Post fall.  Initial encounter.  EXAM: CHEST - 1  VIEW  COMPARISON:  01/06/2013; 08/25/2012  FINDINGS: Grossly unchanged enlarged cardiac silhouette and mediastinal contours. Bilateral medial basilar heterogeneous opacities are unchanged, right greater than left. Mild pulmonary venous congestion without frank evidence of edema. No pleural effusion pneumothorax. No acute osseus abnormalities.  IMPRESSION: 1. Cardiomegaly and pulmonary venous congestion without definite acute cardiopulmonary disease. 2. Grossly unchanged bilateral medial basilar opacities, right greater than left, likely atelectasis.   Electronically Signed   By: Sandi Mariscal M.D.   On: 05/01/2014 22:32    PHYSICAL EXAM Well appearing 68 yo woman, NAD HEENT: Unremarkable,[place], AT Neck:  6 JVD, no thyromegally Back:  No CVA tenderness Lungs:  Clear with no wheezes, rales, or rhonchi HEART:  Regular rate rhythm, no murmurs, no rubs, no clicks Abd:  soft, positive bowel sounds, no organomegally, no rebound, no guarding Ext:  2 plus pulses, no edema, no cyanosis, no clubbing Skin:  No rashes no nodules Neuro:  CN II through XII intact, motor grossly intact    Principal Problem:   Fall  Active Problems:   Atrial fibrillation with RVR, symptomatic   DM (diabetes mellitus)   HTN (hypertension)   Hyperlipidemia   LV dysfunction, EF 45-50% due to a. fib per echo 08/27/12    Acute hip pain   1. Persistent atrial fibrillation  The patient has a h/o afib with symptoms. She has failed medical therapy with sotalol.  Converted to SR after 1 dose of Tikosyn but had Torsades without major QT prolongation.  Continue eliquis - Chads2vasc score is at least 5.   2. Hypertensive cardiovascular disease  Stable   3. Nonischemic CM  Repeat echo --Normal biventricular size and systolic function. Grade 2 diastolic dysfunction. Mild mitral and tricuspid regurgitation. Normal RVSP. Mild right atrial and moderate left atrial dilatation   4. Obesity  Body mass index is 35.8 kg/(m^2).  Weight  reduction is advised    Plan to discharge to home today.  Follow up arranged with Dr [name] 05-19-14 to discuss further treatment options for atrial fibrillation.   Mikle Bosworth.D.

## 2014-05-08 ENCOUNTER — Telehealth: Payer: Self-pay | Admitting: Internal Medicine

## 2014-05-08 NOTE — Progress Notes (Signed)
Thank you Kelly

## 2014-05-08 NOTE — Telephone Encounter (Signed)
New message     Pt is due to have lab drawn here this week.  She is also due to have lab drawn at Dr Pennie Banter office.  Can she have our lab drawn at Dr Pennie Banter office?

## 2014-05-08 NOTE — Telephone Encounter (Signed)
s/w pt and her husband and advised that it was ok for pt to have her lab work with Dr. Pennie Banter office tomorow. Husband advised that they will also give pt the [place] stool cards as well. Husband said thank you for the call tonight.

## 2014-05-19 ENCOUNTER — Ambulatory Visit (INDEPENDENT_AMBULATORY_CARE_PROVIDER_SITE_OTHER): Payer: Medicare HMO | Admitting: Internal Medicine

## 2014-05-19 ENCOUNTER — Encounter: Payer: Self-pay | Admitting: Internal Medicine

## 2014-05-19 VITALS — BP 124/70 | HR 73 | Ht 62.0 in | Wt 195.1 lb

## 2014-05-19 DIAGNOSIS — I4819 Other persistent atrial fibrillation: Secondary | ICD-10-CM

## 2014-05-19 DIAGNOSIS — I481 Persistent atrial fibrillation: Secondary | ICD-10-CM

## 2014-05-19 NOTE — Progress Notes (Signed)
PCP:  [name], MD Referring physician: Dr. [name]  The patient presents today for routine electrophysiology followup.   In April, she underwent attempted DCCV which failed but then spontaneously converted to SR while in recovery. Sotalol was discontinued due to side effects. She has been unaware of any return of atrial fibrillation since April, on diltiazem for rate control.    She was hospitalized 10/12 for fall and rt hip  pain , presented in AFIB ith RVR.  She was noted to be volume overloaded and given a dose of IV Lasix. HR was better controlled on IV Diltiazem. CEs remained negative. She was initially admitted to the Hospitalist service and transferred to Cardiology. She was seen by EP (Dr. [name]) and the decision was made to initiate Tikosyn for rhythm control. However, she developed runs of polymorphic VTach/Torsades. HR was in the 60-80s. Diltiazem gtt was stopped. IV Isuprel was used to increase her HR. She converted to NSR. She was eventually taken off of IV Isuprel the next day and Tikosyn was d/ced after one dose.  Since d/c home, she been maintaining NSR. Continues on NOAC without bleeding issues.   Today, she denies symptoms of palpitations, chest pain,  orthopnea, PND, lower extremity edema, dizziness, presyncope, syncope, or neurologic sequela.  The patient feels that she is tolerating medications without difficulties and is otherwise without complaint today.   Past Medical History  Diagnosis Date  . Diabetes mellitus without complication   . Persistent atrial fibrillation 08/25/2012  . HTN (hypertension) 08/25/2012  . Hyperlipidemia 08/25/2012  . Depression   . Shortness of breath   . GERD (gastroesophageal reflux disease)   . LV dysfunction, EF 45-50% due to a. fib per echo 08/27/12  08/28/2012    a. Echo (10/15):  EF 55-60%, no RWMA, Gr 2 DD, mild MR, mod LAE, normal RVF, mild RAE, mild TR (LA 44 mm)  . Atrial enlargement, left   . Torsades de pointes    due to Tikosyn 04/2014   Past Surgical History  Procedure Laterality Date  . Tubal ligation    . Tee without cardioversion N/A 09/06/2012    Procedure: TRANSESOPHAGEAL ECHOCARDIOGRAM (TEE);  Surgeon: [name], MD;  Location: [facility] ENDOSCOPY;  Service: Cardiovascular;  Laterality: N/A;  . Cardioversion N/A 09/06/2012    Procedure: CARDIOVERSION;  Surgeon: [name], MD;  Location: [facility] ENDOSCOPY;  Service: Cardiovascular;  Laterality: N/A;  . Tee without cardioversion N/A 11/01/2012    Procedure: TRANSESOPHAGEAL ECHOCARDIOGRAM (TEE);  Surgeon: [name], MD;  Location: [facility];  Service: Cardiovascular;  Laterality: N/A;  . Cardioversion N/A 01/07/2013    Procedure: CARDIOVERSION;  Surgeon: [name], MD;  Location: [facility] ENDOSCOPY;  Service: Cardiovascular;  Laterality: N/A;  . Atrial fibrillation ablation  11/02/12    PVI by Dr [name]  . Cardioversion N/A 11/14/2013    Procedure: CARDIOVERSION;  Surgeon: [name], MD;  Location: [facility] ENDOSCOPY;  Service: Cardiovascular;  Laterality: N/A;    Current Outpatient Prescriptions  Medication Sig Dispense Refill  . apixaban (ELIQUIS) 5 MG TABS tablet Take 1 tablet (5 mg total) by mouth 2 (two) times daily.  180 tablet  4  . atorvastatin (LIPITOR) 10 MG tablet Take 10 mg by mouth daily.      . cetirizine (ZYRTEC) 10 MG tablet Take 10 mg by mouth daily.      . CHOLECALCIFEROL PO Take 1 tablet by mouth daily.      . famotidine (PEPCID)  20 MG tablet Take 20 mg by mouth 2 (two) times daily.      . folic acid (FOLVITE) 1 MG tablet Take 1 mg by mouth daily.      . furosemide (LASIX) 40 MG tablet Take 40 mg by mouth daily.      Marland Kitchen HYDROcodone-acetaminophen (NORCO/VICODIN) 5-325 MG per tablet Take 1 tablet by mouth every 6 (six) hours as needed (pain).       Marland Kitchen ibuprofen (ADVIL,MOTRIN) 200 MG tablet Take 3 tablets (600 mg total) by mouth every 8 (eight) hours as needed for mild pain or moderate pain.  1 tablet  0  . metFORMIN  (GLUCOPHAGE) 1000 MG tablet Take 1,000 mg by mouth 2 (two) times daily with a meal.      . Methotrexate Sodium, PF, 250 MG/10ML SOLN Inject 0.8 mLs as directed every 7 (seven) days. Thursdays      . Multiple Vitamin (MULTIVITAMIN WITH MINERALS) TABS Take 1 tablet by mouth daily.      Marland Kitchen NOVOLIN 70/30 RELION (70-30) 100 UNIT/ML injection Inject as directed (sliding scale)      . potassium chloride SA (K-DUR,KLOR-CON) 20 MEQ tablet Take 1 tablet (20 mEq total) by mouth daily.  30 tablet  6  . predniSONE (DELTASONE) 5 MG tablet Take 2.5 mg by mouth daily.       . vitamin B-12 (CYANOCOBALAMIN) 1000 MCG tablet Take 1,000 mcg by mouth daily.      Marland Kitchen zolpidem (AMBIEN) 10 MG tablet Take 10 mg by mouth at bedtime.        No current facility-administered medications for this visit.    Allergies  Allergen Reactions  . Benicar [Olmesartan] Anaphylaxis  . Latex Hives  . Other Itching and Swelling    White meat chicken causes hands and feet to itch and swell.  Phyllis Ginger [Dofetilide] Other (See Comments)    Torsades after 1 dose of Tikosyn without significant QT prolongation    History   Social History  . Marital Status: Married    Spouse Name: N/A    Number of Children: N/A  . Years of Education: N/A   Occupational History  . Not on file.   Social History Main Topics  . Smoking status: Former Smoker    Quit date: 07/21/1994  . Smokeless tobacco: Never Used  . Alcohol Use: No  . Drug Use: No  . Sexual Activity: Yes   Other Topics Concern  . Not on file   Social History Narrative   Lives in [place] with spouse.  Retired          Family History  Problem Relation Age of Onset  . Heart attack Mother   . Diabetes type II Father     ROS-  All systems are reviewed and are negative except as outlined in the HPI above  Physical Exam: Filed Vitals:   05/19/14 1446  BP: 124/70  Pulse: 73  Height: 5\' 2"  (1.575 m)  Weight: 195 lb 1.9 oz (88.506 kg)    GEN- The patient is well  appearing, alert and oriented x 3 today.   Head- normocephalic, atraumatic Eyes-  Sclera clear, conjunctiva pink Ears- hearing intact Oropharynx- clear Neck- supple, no JVP Lymph- no cervical lymphadenopathy Lungs- Clear to ausculation bilaterally, normal work of breathing Heart- bradycardic regular rhythm, no murmurs, rubs or gallops, PMI not laterally displaced GI- soft, NT, ND, + BS Extremities- no clubbing, cyanosis, or edema MS- no significant deformity or atrophy Skin- no rash or lesion  Psych- euthymic mood, full affect Neuro- strength and sensation are intact  EKG today reveals sinus rhythm at 73 beats per minute , IRBBB, QTc 458ms.  Assessment and Plan:  1. Persistent Afib with previous intolerance to sotalol and tikosyn Options were discussed including ablation, but she currently is maintaining NSR and prefers to continue a watchful waiting approach Continue apixiiban  Recheck afib clinic in 3 months ,sooner if needed.

## 2014-05-19 NOTE — Patient Instructions (Signed)
Your physician recommends that you schedule a follow-up appointment in: 3 months with Donna Carroll, NP in the afib clinic  

## 2014-05-22 ENCOUNTER — Telehealth: Payer: Self-pay | Admitting: Internal Medicine

## 2014-05-22 NOTE — Telephone Encounter (Signed)
Noticed at lunch time today that she had increased SOB and that prompted them to check her HR and it was 165.  Now it is running around 125.  She has Cardizem 120mg  at home today and come in tomorrow and see Roderic Palau, NP with Dr [name]

## 2014-05-22 NOTE — Telephone Encounter (Signed)
New message      Pt is in AFIB.  Please call

## 2014-05-23 ENCOUNTER — Encounter: Payer: Self-pay | Admitting: Internal Medicine

## 2014-05-23 ENCOUNTER — Ambulatory Visit (INDEPENDENT_AMBULATORY_CARE_PROVIDER_SITE_OTHER): Payer: Medicare HMO | Admitting: Internal Medicine

## 2014-05-23 VITALS — BP 142/60 | HR 97 | Ht 62.0 in | Wt 187.8 lb

## 2014-05-23 DIAGNOSIS — I481 Persistent atrial fibrillation: Secondary | ICD-10-CM

## 2014-05-23 DIAGNOSIS — I4891 Unspecified atrial fibrillation: Secondary | ICD-10-CM

## 2014-05-23 DIAGNOSIS — I4819 Other persistent atrial fibrillation: Secondary | ICD-10-CM

## 2014-05-23 MED ORDER — APIXABAN 5 MG PO TABS: 5.0000 mg | ORAL_TABLET | Freq: Two times a day (BID) | ORAL | Status: DC

## 2014-05-23 MED ORDER — AMIODARONE HCL 200 MG PO TABS: 400.0000 mg | ORAL_TABLET | Freq: Two times a day (BID) | ORAL | Status: DC

## 2014-05-23 NOTE — Patient Instructions (Addendum)
Your physician has recommended you make the following change in your medication:  1) START Amiodarone 400 mg twice a day  Your physician recommends that you schedule a follow-up appointment on: November 16th with Dr. [name].

## 2014-05-23 NOTE — Progress Notes (Signed)
PCP:  [name], MD Referring physician: Dr. [name]  The patient presents today for routine electrophysiology followup.   In April, she underwent attempted DCCV which failed but then spontaneously converted to SR while in recovery. Sotalol was discontinued due to side effects. She has been unaware of any return of atrial fibrillation since April, on diltiazem for rate control.    She was hospitalized 10/12 for fall and rt hip  pain , presented in AFIB ith RVR.  She was noted to be volume overloaded and given a dose of IV Lasix. HR was better controlled on IV Diltiazem. CEs remained negative. She was initially admitted to the Hospitalist service and transferred to Cardiology. She was seen by EP (Dr. [name]) and the decision was made to initiate Tikosyn for rhythm control. However, she developed runs of polymorphic VTach/Torsades. HR was in the 60-80s. Diltiazem gtt was stopped. IV Isuprel was used to increase her HR. She converted to NSR. She was eventually taken off of IV Isuprel the next day and Tikosyn was d/ced after one dose.  Since d/c home, she had been maintaining NSR. Continues on NOAC . Marland Kitchen Had a drop in H&H in hospital thought to be secondary to fall and trauma to hip. Last lab showed improvement with plans to be recheck in near future with pt performing stool cards.  Yesterday, she called to clinic c/o return to afib with rvr,dizziness. She was advised to go back on Cardizem CD 120 mg a day and return to clinic today. EKG shows afib at 122 bpm. Pt is symptomatic with fatigue and dizziness.  Today, she is positive for  symptoms of palpitations,lower extremity edema, dizziness,.    Past Medical History  Diagnosis Date  . Diabetes mellitus without complication   . Persistent atrial fibrillation 08/25/2012  . HTN (hypertension) 08/25/2012  . Hyperlipidemia 08/25/2012  . Depression   . Shortness of breath   . GERD (gastroesophageal reflux disease)   . LV dysfunction, EF  45-50% due to a. fib per echo 08/27/12  08/28/2012    a. Echo (10/15):  EF 55-60%, no RWMA, Gr 2 DD, mild MR, mod LAE, normal RVF, mild RAE, mild TR (LA 44 mm)  . Atrial enlargement, left   . Torsades de pointes     due to Tikosyn 04/2014   Past Surgical History  Procedure Laterality Date  . Tubal ligation    . Tee without cardioversion N/A 09/06/2012    Procedure: TRANSESOPHAGEAL ECHOCARDIOGRAM (TEE);  Surgeon: [name], MD;  Location: [facility] ENDOSCOPY;  Service: Cardiovascular;  Laterality: N/A;  . Cardioversion N/A 09/06/2012    Procedure: CARDIOVERSION;  Surgeon: [name], MD;  Location: [facility] ENDOSCOPY;  Service: Cardiovascular;  Laterality: N/A;  . Tee without cardioversion N/A 11/01/2012    Procedure: TRANSESOPHAGEAL ECHOCARDIOGRAM (TEE);  Surgeon: [name], MD;  Location: [place];  Service: Cardiovascular;  Laterality: N/A;  . Cardioversion N/A 01/07/2013    Procedure: CARDIOVERSION;  Surgeon: Sanda , MD;  Location: [facility] ENDOSCOPY;  Service: Cardiovascular;  Laterality: N/A;  . Atrial fibrillation ablation  11/02/12    PVI by Dr [name]  . Cardioversion N/A 11/14/2013    Procedure: CARDIOVERSION;  Surgeon: [name], MD;  Location: [facility] ENDOSCOPY;  Service: Cardiovascular;  Laterality: N/A;    Current Outpatient Prescriptions  Medication Sig Dispense Refill  . apixaban (ELIQUIS) 5 MG TABS tablet Take 1 tablet (5 mg total) by mouth 2 (two) times daily. 180 tablet 2  .  atorvastatin (LIPITOR) 10 MG tablet Take 10 mg by mouth daily.    Marland Kitchen CARTIA XT 120 MG 24 hr capsule Take 120 mg by mouth daily.  3  . cetirizine (ZYRTEC) 10 MG tablet Take 10 mg by mouth daily.    . CHOLECALCIFEROL PO Take 1 tablet by mouth daily.    . famotidine (PEPCID) 20 MG tablet Take 20 mg by mouth 2 (two) times daily.    . folic acid (FOLVITE) 1 MG tablet Take 1 mg by mouth daily.    . furosemide (LASIX) 40 MG tablet Take 40 mg by mouth daily.    Marland Kitchen HYDROcodone-acetaminophen  (NORCO/VICODIN) 5-325 MG per tablet Take 1 tablet by mouth every 6 (six) hours as needed (pain).     Marland Kitchen ibuprofen (ADVIL,MOTRIN) 200 MG tablet Take 3 tablets (600 mg total) by mouth every 8 (eight) hours as needed for mild pain or moderate pain. 1 tablet 0  . metFORMIN (GLUCOPHAGE) 1000 MG tablet Take 1,000 mg by mouth 2 (two) times daily with a meal.    . Methotrexate Sodium, PF, 250 MG/10ML SOLN Inject 0.8 mLs as directed every 7 (seven) days. Thursdays    . Multiple Vitamin (MULTIVITAMIN WITH MINERALS) TABS Take 1 tablet by mouth daily.    Marland Kitchen NOVOLIN 70/30 RELION (70-30) 100 UNIT/ML injection Inject as directed (sliding scale)    . potassium chloride SA (K-DUR,KLOR-CON) 20 MEQ tablet Take 1 tablet (20 mEq total) by mouth daily. 30 tablet 6  . predniSONE (DELTASONE) 5 MG tablet Take 2.5 mg by mouth daily.     . vitamin B-12 (CYANOCOBALAMIN) 1000 MCG tablet Take 1,000 mcg by mouth daily.    Marland Kitchen zolpidem (AMBIEN) 10 MG tablet Take 10 mg by mouth at bedtime.     Marland Kitchen amiodarone (PACERONE) 200 MG tablet Take 2 tablets (400 mg total) by mouth 2 (two) times daily. 120 tablet 0   No current facility-administered medications for this visit.    Allergies  Allergen Reactions  . Benicar [Olmesartan] Anaphylaxis  . Latex Hives  . Other Itching and Swelling    White meat chicken causes hands and feet to itch and swell.  Phyllis Ginger [Dofetilide] Other (See Comments)    Torsades after 1 dose of Tikosyn without significant QT prolongation    History   Social History  . Marital Status: Married    Spouse Name: N/A    Number of Children: N/A  . Years of Education: N/A   Occupational History  . Not on file.   Social History Main Topics  . Smoking status: Former Smoker    Quit date: 07/21/1994  . Smokeless tobacco: Never Used  . Alcohol Use: No  . Drug Use: No  . Sexual Activity: Yes   Other Topics Concern  . Not on file   Social History Narrative   Lives in [place] with spouse.  Retired           Family History  Problem Relation Age of Onset  . Heart attack Mother   . Diabetes type II Father     ROS-  All systems are reviewed and are negative except as outlined in the HPI above  Physical Exam: Filed Vitals:   05/23/14 1241  BP: 142/60  Pulse: 97  Height: 5\' 2"  (1.575 m)  Weight: 187 lb 12.8 oz (85.186 kg)    GEN- The patient is well appearing, alert and oriented x 3 today.   Head- normocephalic, atraumatic Eyes-  Sclera clear, conjunctiva pink Ears-  hearing intact Oropharynx- clear Neck- supple, no JVP Lymph- no cervical lymphadenopathy Lungs- Clear to ausculation bilaterally, normal work of breathing Heart-Rapid, irregular rhythm, no murmurs, rubs or gallops, PMI not laterally displaced GI- soft, NT, ND, + BS Extremities- no clubbing, cyanosis, or trace edema. MS- no significant deformity or atrophy Skin- no rash or lesion Psych- euthymic mood, full affect Neuro- strength and sensation are intact  EKG today reveals AFib with RVR 122 bpm. RBBB, QTc 531ms..  Assessment and Plan:  1. Persistent Afib with previous intolerance to sotalol and tikosyn Options were discussed including ablation on last visit, but she currently was maintaining NSR and preferred to continue a watchful waiting approach. However she is now back in afib with RVR and is symptomatic. The patient was seen with Dr. [name] who offered pt hospitalization, DCCV or loading with amiodarone.After risk vrs benefit, she chose amiodarone. Per Dr. [name], start 400 mg bid and recheck 10-14 days with plans to cardiovert if not back in SR. Continue Cardizem CD 120  mg qd. Continue apixaban. F/u anemia as outlined. Has guiac stools to complete but has not noticed any black or bloody stools and became dizzy only with RVR. To Er if symptoms worsen.   Likelihood of torsade associated with amiodarone not withstanding dofetilide is anticipated be extremely low. We reviewed this with the patient and her  husband.

## 2014-06-05 ENCOUNTER — Ambulatory Visit (INDEPENDENT_AMBULATORY_CARE_PROVIDER_SITE_OTHER): Payer: Medicare HMO | Admitting: Internal Medicine

## 2014-06-05 ENCOUNTER — Encounter: Payer: Self-pay | Admitting: Internal Medicine

## 2014-06-05 ENCOUNTER — Telehealth: Payer: Self-pay | Admitting: *Deleted

## 2014-06-05 ENCOUNTER — Other Ambulatory Visit (INDEPENDENT_AMBULATORY_CARE_PROVIDER_SITE_OTHER): Payer: Medicare HMO | Admitting: *Deleted

## 2014-06-05 VITALS — BP 140/60 | HR 63 | Ht 62.4 in | Wt 193.6 lb

## 2014-06-05 DIAGNOSIS — I4819 Other persistent atrial fibrillation: Secondary | ICD-10-CM

## 2014-06-05 DIAGNOSIS — I481 Persistent atrial fibrillation: Secondary | ICD-10-CM

## 2014-06-05 DIAGNOSIS — I10 Essential (primary) hypertension: Secondary | ICD-10-CM

## 2014-06-05 DIAGNOSIS — D649 Anemia, unspecified: Secondary | ICD-10-CM

## 2014-06-05 LAB — CBC
HCT: 25.8 % — ABNORMAL LOW (ref 36.0–46.0)
Hemoglobin: 8.1 g/dL — ABNORMAL LOW (ref 12.0–15.0)
MCHC: 31.4 g/dL (ref 30.0–36.0)
MCV: 79.4 fl (ref 78.0–100.0)
Platelets: 582 10*3/uL — ABNORMAL HIGH (ref 150.0–400.0)
RBC: 3.25 Mil/uL — ABNORMAL LOW (ref 3.87–5.11)
RDW: 22 % — ABNORMAL HIGH (ref 11.5–15.5)
WBC: 10 10*3/uL (ref 4.0–10.5)

## 2014-06-05 MED ORDER — AMIODARONE HCL 200 MG PO TABS: 400.0000 mg | ORAL_TABLET | Freq: Every day | ORAL | Status: DC

## 2014-06-05 NOTE — Progress Notes (Signed)
PCP:  Alison Pel, MD Referring physician: Dr. [name] Bell  The patient presents today for routine electrophysiology followup.   In April, she underwent attempted DCCV which failed but then spontaneously converted to SR while in recovery. Sotalol was discontinued due to side effects. She has been unaware of any return of atrial fibrillation since April, on diltiazem for rate control.    She was hospitalized 10/12 for fall and rt hip  pain , presented in AFIB ith RVR.  She was noted to be volume overloaded and given a dose of IV Lasix. HR was better controlled on IV Diltiazem. CEs remained negative. She was initially admitted to the Hospitalist service and transferred to Cardiology. She was seen by EP (Dr. Rayann Bell) and the decision was made to initiate Tikosyn for rhythm control. However, she developed runs of polymorphic VTach/Torsades. HR was in the 60-80s. Diltiazem gtt was stopped. IV Isuprel was used to increase her HR. She converted to NSR. She was eventually taken off of IV Isuprel the next day and Tikosyn was d/ced after one dose.  Had a drop in H&H in hospital thought to be secondary to fall and trauma to hip. Last lab showed improvement with plans to be recheck in near future with pt performing stool cards.  11/3,  she called to clinic c/o return to afib with rvr,dizziness. She was advised to go back on Cardizem CD 120 mg a day and was seen with Dr. Caryl Bell and loaded on amiodarone. She reports she converted 2 days later and has been maintaining NSR. She did have a CBC drawn this am which is still pending. She has collected stool cards but forgot to bring them for the lab. She is c/o dyspnea over the last few weeks, possibly form anemia vrs amiodarone.   Today, she is positive for  symptoms of dyspnea with certain exertional activities with ongoing work up for anemia.     Past Medical History  Diagnosis Date  . Diabetes mellitus without complication   . Persistent atrial  fibrillation 08/25/2012  . HTN (hypertension) 08/25/2012  . Hyperlipidemia 08/25/2012  . Depression   . Shortness of breath   . GERD (gastroesophageal reflux disease)   . LV dysfunction, EF 45-50% due to a. fib per echo 08/27/12  08/28/2012    a. Echo (10/15):  EF 55-60%, no RWMA, Gr 2 DD, mild MR, mod LAE, normal RVF, mild RAE, mild TR (LA 44 mm)  . Atrial enlargement, left   . Torsades de pointes     due to Tikosyn 04/2014   Past Surgical History  Procedure Laterality Date  . Tubal ligation    . Tee without cardioversion N/A 09/06/2012    Procedure: TRANSESOPHAGEAL ECHOCARDIOGRAM (TEE);  Surgeon: Alison Casino, MD;  Location: [facility] ENDOSCOPY;  Service: Cardiovascular;  Laterality: N/A;  . Cardioversion N/A 09/06/2012    Procedure: CARDIOVERSION;  Surgeon: Alison Casino, MD;  Location: [facility] ENDOSCOPY;  Service: Cardiovascular;  Laterality: N/A;  . Tee without cardioversion N/A 11/01/2012    Procedure: TRANSESOPHAGEAL ECHOCARDIOGRAM (TEE);  Surgeon: Alison M Martinique, MD;  Location: [place];  Service: Cardiovascular;  Laterality: N/A;  . Cardioversion N/A 01/07/2013    Procedure: CARDIOVERSION;  Surgeon: Alison Klein, MD;  Location: [facility] ENDOSCOPY;  Service: Cardiovascular;  Laterality: N/A;  . Atrial fibrillation ablation  11/02/12    PVI by Dr Alison Bell  . Cardioversion N/A 11/14/2013    Procedure: CARDIOVERSION;  Surgeon: Alison Casino, MD;  Location: [place];  Service: Cardiovascular;  Laterality: N/A;    Current Outpatient Prescriptions  Medication Sig Dispense Refill  . amiodarone (PACERONE) 200 MG tablet Take 2 tablets (400 mg total) by mouth daily. 120 tablet 0  . apixaban (ELIQUIS) 5 MG TABS tablet Take 1 tablet (5 mg total) by mouth 2 (two) times daily. 180 tablet 2  . atorvastatin (LIPITOR) 10 MG tablet Take 10 mg by mouth daily.    Marland Kitchen CARTIA XT 120 MG 24 hr capsule Take 120 mg by mouth daily.  3  . CHOLECALCIFEROL PO Take 1 tablet by mouth daily.    . famotidine (PEPCID) 20 MG  tablet Take 20 mg by mouth 2 (two) times daily.    . folic acid (FOLVITE) 1 MG tablet Take 1 mg by mouth daily.    . furosemide (LASIX) 40 MG tablet Take 40 mg by mouth daily.    . metFORMIN (GLUCOPHAGE) 1000 MG tablet Take 1,000 mg by mouth 2 (two) times daily with a meal.    . Methotrexate Sodium, PF, 250 MG/10ML SOLN Inject 0.8 mLs as directed every 7 (seven) days. Thursdays    . Multiple Vitamin (MULTIVITAMIN WITH MINERALS) TABS Take 1 tablet by mouth daily.    Marland Kitchen NOVOLIN 70/30 RELION (70-30) 100 UNIT/ML injection Inject as directed (sliding scale)    . potassium chloride SA (K-DUR,KLOR-CON) 20 MEQ tablet Take 1 tablet (20 mEq total) by mouth daily. 30 tablet 6  . predniSONE (DELTASONE) 5 MG tablet Take 2.5 mg by mouth daily.     . vitamin B-12 (CYANOCOBALAMIN) 1000 MCG tablet Take 1,000 mcg by mouth daily.    Marland Kitchen zolpidem (AMBIEN) 10 MG tablet Take 10 mg by mouth at bedtime.     . cetirizine (ZYRTEC) 10 MG tablet Take 10 mg by mouth daily.    Marland Kitchen HYDROcodone-acetaminophen (NORCO/VICODIN) 5-325 MG per tablet Take 1 tablet by mouth every 6 (six) hours as needed (pain).     Marland Kitchen ibuprofen (ADVIL,MOTRIN) 200 MG tablet Take 3 tablets (600 mg total) by mouth every 8 (eight) hours as needed for mild pain or moderate pain. 1 tablet 0   No current facility-administered medications for this visit.    Allergies  Allergen Reactions  . Benicar [Olmesartan] Anaphylaxis  . Latex Hives  . Other Itching and Swelling    White meat chicken causes hands and feet to itch and swell.  Alison Bell [Dofetilide] Other (See Comments)    Torsades after 1 dose of Tikosyn without significant QT prolongation    History   Social History  . Marital Status: Married    Spouse Name: N/A    Number of Children: N/A  . Years of Education: N/A   Occupational History  . Not on file.   Social History Main Topics  . Smoking status: Former Smoker    Quit date: 07/21/1994  . Smokeless tobacco: Never Used  . Alcohol Use: No   . Drug Use: No  . Sexual Activity: Yes   Other Topics Concern  . Not on file   Social History Narrative   Lives in [place] with spouse.  Retired          Family History  Problem Relation Age of Onset  . Heart attack Mother   . Diabetes type II Father     ROS-  All systems are reviewed and are negative except as outlined in the HPI above  Physical Exam: Filed Vitals:   06/05/14 1207  BP: 140/60  Pulse: 63  Height: 5'  2.4" (1.585 m)  Weight: 193 lb 9.6 oz (87.816 kg)    GEN- The patient is well appearing, alert and oriented x 3 today.   Head- normocephalic, atraumatic Eyes-  Sclera clear, conjunctiva pink Ears- hearing intact Oropharynx- clear Neck- supple, no JVP Lymph- no cervical lymphadenopathy Lungs- Clear to ausculation bilaterally, normal work of breathing Heart-Regular rhythm, no murmurs, rubs or gallops, PMI not laterally displaced GI- soft, NT, ND, + BS Extremities- no clubbing, cyanosis, or trace edema. MS- no significant deformity or atrophy Skin- no rash or lesion Psych- euthymic mood, full affect Neuro- strength and sensation are intact  EKG today reveals NSR, RBBB with PR 180 ms, QRS 173ms, QTC 466 ms.  Assessment and Plan:  1. Persistent Afib with previous intolerance to sotalol and tikosyn In NSR with amiodarone. Decrease dose to 200 mg a day. Continue Cardizem to  120 mg a day. Continue NOAC.  2. Anemia Continue work up with Dr Shelia Media CBC pending Pt has completed stool cards, reminded to turn in to Dr Shelia Media.  Return in 2 months in afib clinic.   I have seen, examined the patient, and reviewed the above assessment and plan with Roderic Palau NP.  Changes to above are made where necessary.  She remains in sinus rhythm. We will reduce amiodarone today.  She will need LFTs/TFTs upon return to the AF clinic.  She also needs to follow-up with Dr Shelia Media for anemia evaluation.   HTN is stable and no changes are made today.  Co Sign: [name], MD 06/05/2014 8:42 PM

## 2014-06-05 NOTE — Telephone Encounter (Signed)
pt notified about lab results and states she has the stool cards already and will do them. Pt advised to f/u w/PCP about anemia, pt said ok and thank you. I will fax results to Dr. Shelia Media today, pt aware.

## 2014-06-05 NOTE — Patient Instructions (Signed)
Your physician recommends that you schedule a follow-up appointment in: 2 months with Roderic Palau, NP in [place] clinic in 2 months at hospital    Your physician has recommended you make the following change in your medication:  1) Decrease Amiodarone to 200mg  daily

## 2014-06-19 ENCOUNTER — Other Ambulatory Visit: Payer: Self-pay | Admitting: Internal Medicine

## 2014-06-29 ENCOUNTER — Encounter (HOSPITAL_COMMUNITY): Payer: Self-pay | Admitting: Internal Medicine

## 2014-07-26 ENCOUNTER — Inpatient Hospital Stay (HOSPITAL_COMMUNITY)
Admission: EM | Admit: 2014-07-26 | Discharge: 2014-07-29 | DRG: 378 | Disposition: A | Discharge status: Home / Self Care | Payer: Medicare HMO | Attending: Internal Medicine | Admitting: Internal Medicine

## 2014-07-26 ENCOUNTER — Encounter (HOSPITAL_COMMUNITY): Payer: Self-pay | Admitting: *Deleted

## 2014-07-26 DIAGNOSIS — K648 Other hemorrhoids: Secondary | ICD-10-CM | POA: Diagnosis present

## 2014-07-26 DIAGNOSIS — K625 Hemorrhage of anus and rectum: Secondary | ICD-10-CM | POA: Insufficient documentation

## 2014-07-26 DIAGNOSIS — E876 Hypokalemia: Secondary | ICD-10-CM | POA: Diagnosis present

## 2014-07-26 DIAGNOSIS — Z91040 Latex allergy status: Secondary | ICD-10-CM | POA: Diagnosis not present

## 2014-07-26 DIAGNOSIS — K5731 Diverticulosis of large intestine without perforation or abscess with bleeding: Secondary | ICD-10-CM | POA: Diagnosis present

## 2014-07-26 DIAGNOSIS — I5042 Chronic combined systolic (congestive) and diastolic (congestive) heart failure: Secondary | ICD-10-CM | POA: Diagnosis present

## 2014-07-26 DIAGNOSIS — I429 Cardiomyopathy, unspecified: Secondary | ICD-10-CM | POA: Diagnosis present

## 2014-07-26 DIAGNOSIS — E119 Type 2 diabetes mellitus without complications: Secondary | ICD-10-CM | POA: Diagnosis present

## 2014-07-26 DIAGNOSIS — I4891 Unspecified atrial fibrillation: Secondary | ICD-10-CM

## 2014-07-26 DIAGNOSIS — M1990 Unspecified osteoarthritis, unspecified site: Secondary | ICD-10-CM | POA: Diagnosis present

## 2014-07-26 DIAGNOSIS — F329 Major depressive disorder, single episode, unspecified: Secondary | ICD-10-CM | POA: Diagnosis present

## 2014-07-26 DIAGNOSIS — Z888 Allergy status to other drugs, medicaments and biological substances status: Secondary | ICD-10-CM | POA: Diagnosis not present

## 2014-07-26 DIAGNOSIS — M069 Rheumatoid arthritis, unspecified: Secondary | ICD-10-CM | POA: Diagnosis present

## 2014-07-26 DIAGNOSIS — K219 Gastro-esophageal reflux disease without esophagitis: Secondary | ICD-10-CM | POA: Diagnosis present

## 2014-07-26 DIAGNOSIS — Z7901 Long term (current) use of anticoagulants: Secondary | ICD-10-CM

## 2014-07-26 DIAGNOSIS — K635 Polyp of colon: Secondary | ICD-10-CM | POA: Diagnosis present

## 2014-07-26 DIAGNOSIS — Z87891 Personal history of nicotine dependence: Secondary | ICD-10-CM

## 2014-07-26 DIAGNOSIS — I428 Other cardiomyopathies: Secondary | ICD-10-CM

## 2014-07-26 DIAGNOSIS — D125 Benign neoplasm of sigmoid colon: Secondary | ICD-10-CM

## 2014-07-26 DIAGNOSIS — D62 Acute posthemorrhagic anemia: Secondary | ICD-10-CM | POA: Diagnosis present

## 2014-07-26 DIAGNOSIS — I481 Persistent atrial fibrillation: Secondary | ICD-10-CM | POA: Diagnosis present

## 2014-07-26 DIAGNOSIS — I5033 Acute on chronic diastolic (congestive) heart failure: Secondary | ICD-10-CM

## 2014-07-26 DIAGNOSIS — E785 Hyperlipidemia, unspecified: Secondary | ICD-10-CM | POA: Diagnosis present

## 2014-07-26 DIAGNOSIS — I10 Essential (primary) hypertension: Secondary | ICD-10-CM | POA: Diagnosis present

## 2014-07-26 DIAGNOSIS — K922 Gastrointestinal hemorrhage, unspecified: Secondary | ICD-10-CM

## 2014-07-26 DIAGNOSIS — Z794 Long term (current) use of insulin: Secondary | ICD-10-CM

## 2014-07-26 LAB — URINALYSIS, ROUTINE W REFLEX MICROSCOPIC
Bilirubin Urine: NEGATIVE
Glucose, UA: NEGATIVE mg/dL
Hgb urine dipstick: NEGATIVE
Ketones, ur: NEGATIVE mg/dL
Nitrite: NEGATIVE
Protein, ur: NEGATIVE mg/dL
Specific Gravity, Urine: 1.009 (ref 1.005–1.030)
Urobilinogen, UA: 0.2 mg/dL (ref 0.0–1.0)
pH: 5 (ref 5.0–8.0)

## 2014-07-26 LAB — CBC WITH DIFFERENTIAL/PLATELET
Basophils Absolute: 0 10*3/uL (ref 0.0–0.1)
Basophils Relative: 0 % (ref 0–1)
Eosinophils Absolute: 0 10*3/uL (ref 0.0–0.7)
Eosinophils Relative: 0 % (ref 0–5)
HCT: 21.7 % — ABNORMAL LOW (ref 36.0–46.0)
Hemoglobin: 6.7 g/dL — CL (ref 12.0–15.0)
Lymphocytes Relative: 10 % — ABNORMAL LOW (ref 12–46)
Lymphs Abs: 1 10*3/uL (ref 0.7–4.0)
MCH: 25 pg — ABNORMAL LOW (ref 26.0–34.0)
MCHC: 30.9 g/dL (ref 30.0–36.0)
MCV: 81 fL (ref 78.0–100.0)
Monocytes Absolute: 0.3 10*3/uL (ref 0.1–1.0)
Monocytes Relative: 3 % (ref 3–12)
Neutro Abs: 8.5 10*3/uL — ABNORMAL HIGH (ref 1.7–7.7)
Neutrophils Relative %: 87 % — ABNORMAL HIGH (ref 43–77)
Platelets: 484 10*3/uL — ABNORMAL HIGH (ref 150–400)
RBC: 2.68 MIL/uL — ABNORMAL LOW (ref 3.87–5.11)
RDW: 19.7 % — ABNORMAL HIGH (ref 11.5–15.5)
WBC: 9.8 10*3/uL (ref 4.0–10.5)

## 2014-07-26 LAB — GLUCOSE, CAPILLARY
Glucose-Capillary: 154 mg/dL — ABNORMAL HIGH (ref 70–99)
Glucose-Capillary: 136 mg/dL — ABNORMAL HIGH (ref 70–99)

## 2014-07-26 LAB — I-STAT CHEM 8, ED
BUN: 25 mg/dL — ABNORMAL HIGH (ref 6–23)
Calcium, Ion: 1.12 mmol/L — ABNORMAL LOW (ref 1.13–1.30)
Chloride: 100 mEq/L (ref 96–112)
Creatinine, Ser: 1 mg/dL (ref 0.50–1.10)
Glucose, Bld: 172 mg/dL — ABNORMAL HIGH (ref 70–99)
HCT: 22 % — ABNORMAL LOW (ref 36.0–46.0)
Hemoglobin: 7.5 g/dL — ABNORMAL LOW (ref 12.0–15.0)
Potassium: 4.3 mmol/L (ref 3.5–5.1)
Sodium: 135 mmol/L (ref 135–145)
TCO2: 20 mmol/L (ref 0–100)

## 2014-07-26 LAB — COMPREHENSIVE METABOLIC PANEL
ALT: 25 U/L (ref 0–35)
AST: 32 U/L (ref 0–37)
Albumin: 3.6 g/dL (ref 3.5–5.2)
Alkaline Phosphatase: 47 U/L (ref 39–117)
Anion gap: 9 (ref 5–15)
BUN: 23 mg/dL (ref 6–23)
CO2: 26 mmol/L (ref 19–32)
Calcium: 9.2 mg/dL (ref 8.4–10.5)
Chloride: 101 mEq/L (ref 96–112)
Creatinine, Ser: 1.01 mg/dL (ref 0.50–1.10)
GFR calc Af Amer: 64 mL/min — ABNORMAL LOW (ref >90)
GFR calc non Af Amer: 55 mL/min — ABNORMAL LOW (ref >90)
Glucose, Bld: 170 mg/dL — ABNORMAL HIGH (ref 70–99)
Potassium: 4.3 mmol/L (ref 3.5–5.1)
Sodium: 136 mmol/L (ref 135–145)
Total Bilirubin: 0.1 mg/dL — ABNORMAL LOW (ref 0.3–1.2)
Total Protein: 7.2 g/dL (ref 6.0–8.3)

## 2014-07-26 LAB — APTT: aPTT: 29 seconds (ref 24–37)

## 2014-07-26 LAB — URINE MICROSCOPIC-ADD ON

## 2014-07-26 LAB — PREPARE RBC (CROSSMATCH)

## 2014-07-26 LAB — PROTIME-INR
INR: 1.29 (ref 0.00–1.49)
Prothrombin Time: 16.3 seconds — ABNORMAL HIGH (ref 11.6–15.2)

## 2014-07-26 LAB — I-STAT CG4 LACTIC ACID, ED: Lactic Acid, Venous: 3.95 mmol/L — ABNORMAL HIGH (ref 0.5–2.2)

## 2014-07-26 LAB — POC OCCULT BLOOD, ED: Fecal Occult Bld: POSITIVE — AB

## 2014-07-26 LAB — TSH: TSH: 0.336 u[IU]/mL — ABNORMAL LOW (ref 0.350–4.500)

## 2014-07-26 MED ORDER — SODIUM CHLORIDE 0.9 % IV SOLN: Freq: Once | INTRAVENOUS | Status: DC

## 2014-07-26 MED ORDER — OXYCODONE HCL 5 MG PO TABS
5.0000 mg | ORAL_TABLET | ORAL | Status: DC | PRN
  Administered 2014-07-28: 5 mg via ORAL
  Filled 2014-07-26: qty 1

## 2014-07-26 MED ORDER — ONDANSETRON HCL 4 MG/2ML IJ SOLN
4.0000 mg | Freq: Once | INTRAMUSCULAR | Status: AC
  Administered 2014-07-26: 4 mg via INTRAVENOUS
  Filled 2014-07-26: qty 2

## 2014-07-26 MED ORDER — SODIUM CHLORIDE 0.9 % IV SOLN: 250.0000 mL | INTRAVENOUS | Status: DC | PRN

## 2014-07-26 MED ORDER — ACETAMINOPHEN 650 MG RE SUPP: 650.0000 mg | Freq: Four times a day (QID) | RECTAL | Status: DC | PRN

## 2014-07-26 MED ORDER — SODIUM CHLORIDE 0.9 % IJ SOLN
3.0000 mL | Freq: Two times a day (BID) | INTRAMUSCULAR | Status: DC
  Administered 2014-07-26 – 2014-07-28 (×5): 3 mL via INTRAVENOUS

## 2014-07-26 MED ORDER — INSULIN ASPART 100 UNIT/ML ~~LOC~~ SOLN
0.0000 [IU] | SUBCUTANEOUS | Status: DC
  Administered 2014-07-26: 2 [IU] via SUBCUTANEOUS
  Administered 2014-07-27: 3 [IU] via SUBCUTANEOUS
  Administered 2014-07-27 (×2): 2 [IU] via SUBCUTANEOUS
  Administered 2014-07-27 – 2014-07-28 (×3): 3 [IU] via SUBCUTANEOUS
  Administered 2014-07-28: 2 [IU] via SUBCUTANEOUS
  Administered 2014-07-28: 3 [IU] via SUBCUTANEOUS
  Administered 2014-07-28: 8 [IU] via SUBCUTANEOUS

## 2014-07-26 MED ORDER — FUROSEMIDE 10 MG/ML IJ SOLN
20.0000 mg | Freq: Once | INTRAMUSCULAR | Status: AC
  Administered 2014-07-27: 20 mg via INTRAVENOUS
  Filled 2014-07-26: qty 2

## 2014-07-26 MED ORDER — SODIUM CHLORIDE 0.9 % IV SOLN
1000.0000 mL | INTRAVENOUS | Status: DC
  Administered 2014-07-26: 1000 mL via INTRAVENOUS

## 2014-07-26 MED ORDER — LORATADINE 10 MG PO TABS
10.0000 mg | ORAL_TABLET | Freq: Every day | ORAL | Status: DC
  Administered 2014-07-27 – 2014-07-29 (×3): 10 mg via ORAL
  Filled 2014-07-26 (×3): qty 1

## 2014-07-26 MED ORDER — ONDANSETRON HCL 4 MG/2ML IJ SOLN
4.0000 mg | Freq: Four times a day (QID) | INTRAMUSCULAR | Status: DC | PRN
  Administered 2014-07-28: 4 mg via INTRAVENOUS
  Filled 2014-07-26: qty 2

## 2014-07-26 MED ORDER — PREDNISONE 2.5 MG PO TABS
2.5000 mg | ORAL_TABLET | Freq: Every day | ORAL | Status: DC
  Administered 2014-07-27 – 2014-07-29 (×3): 2.5 mg via ORAL
  Filled 2014-07-26 (×3): qty 1

## 2014-07-26 MED ORDER — SODIUM CHLORIDE 0.9 % IV SOLN: INTRAVENOUS | Status: DC

## 2014-07-26 MED ORDER — SODIUM CHLORIDE 0.9 % IJ SOLN
3.0000 mL | Freq: Two times a day (BID) | INTRAMUSCULAR | Status: DC
  Administered 2014-07-28: 3 mL via INTRAVENOUS

## 2014-07-26 MED ORDER — PANTOPRAZOLE SODIUM 40 MG PO TBEC
40.0000 mg | DELAYED_RELEASE_TABLET | Freq: Every day | ORAL | Status: DC
  Administered 2014-07-27 – 2014-07-29 (×3): 40 mg via ORAL
  Filled 2014-07-26 (×3): qty 1

## 2014-07-26 MED ORDER — FUROSEMIDE 40 MG PO TABS
40.0000 mg | ORAL_TABLET | Freq: Every day | ORAL | Status: DC
  Administered 2014-07-27 – 2014-07-29 (×3): 40 mg via ORAL
  Filled 2014-07-26 (×3): qty 1

## 2014-07-26 MED ORDER — ONDANSETRON HCL 4 MG PO TABS: 4.0000 mg | ORAL_TABLET | Freq: Four times a day (QID) | ORAL | Status: DC | PRN

## 2014-07-26 MED ORDER — PANTOPRAZOLE SODIUM 40 MG IV SOLR
40.0000 mg | Freq: Once | INTRAVENOUS | Status: AC
  Administered 2014-07-26: 40 mg via INTRAVENOUS
  Filled 2014-07-26: qty 40

## 2014-07-26 MED ORDER — MORPHINE SULFATE 2 MG/ML IJ SOLN: 2.0000 mg | INTRAMUSCULAR | Status: DC | PRN

## 2014-07-26 MED ORDER — ATORVASTATIN CALCIUM 10 MG PO TABS
10.0000 mg | ORAL_TABLET | Freq: Every day | ORAL | Status: DC
  Administered 2014-07-27 – 2014-07-29 (×3): 10 mg via ORAL
  Filled 2014-07-26 (×3): qty 1

## 2014-07-26 MED ORDER — ACETAMINOPHEN 325 MG PO TABS
650.0000 mg | ORAL_TABLET | Freq: Four times a day (QID) | ORAL | Status: DC | PRN
  Administered 2014-07-26: 650 mg via ORAL
  Filled 2014-07-26: qty 2

## 2014-07-26 MED ORDER — AMIODARONE HCL 200 MG PO TABS
400.0000 mg | ORAL_TABLET | Freq: Two times a day (BID) | ORAL | Status: DC
  Administered 2014-07-26: 400 mg via ORAL
  Filled 2014-07-26 (×3): qty 2

## 2014-07-26 MED ORDER — ZOLPIDEM TARTRATE 5 MG PO TABS
5.0000 mg | ORAL_TABLET | Freq: Every evening | ORAL | Status: DC | PRN
  Administered 2014-07-26 – 2014-07-28 (×2): 5 mg via ORAL
  Filled 2014-07-26 (×3): qty 1

## 2014-07-26 MED ORDER — SODIUM CHLORIDE 0.9 % IJ SOLN: 3.0000 mL | INTRAMUSCULAR | Status: DC | PRN

## 2014-07-26 NOTE — ED Provider Notes (Signed)
CSN: [id number]     Arrival date & time 07/26/14  1252 History   First MD Initiated Contact with Patient 07/26/14 1325     Chief Complaint  Patient presents with  . Rectal Bleeding     (Consider location/radiation/quality/duration/timing/severity/associated sxs/prior Treatment) HPI Comments: Patient from home with 3 episodes of bloody stool since yesterday. States toilet water is red and the stool appears red and maroon and black. No belly pain no rectal pain. No vomiting. Patient is on Eliquis for history of atrial fibrillation. She also recently started a medication for her rheumatoid arthritis which she feels may be contributing. She's never had a colonoscopy. Denies any chest pain, shortness of breath, nausea or vomiting. She endorses lightheadedness and fatigue. No vertigo. Symptoms improved with lying down.  The history is provided by the patient and a relative.    Past Medical History  Diagnosis Date  . Diabetes mellitus without complication   . Persistent atrial fibrillation 08/25/2012  . HTN (hypertension) 08/25/2012  . Hyperlipidemia 08/25/2012  . Depression   . Shortness of breath   . GERD (gastroesophageal reflux disease)   . LV dysfunction, EF 45-50% due to a. fib per echo 08/27/12  08/28/2012    a. Echo (10/15):  EF 55-60%, no RWMA, Gr 2 DD, mild MR, mod LAE, normal RVF, mild RAE, mild TR (LA 44 mm)  . Atrial enlargement, left   . Torsades de pointes     due to Tikosyn 04/2014   Past Surgical History  Procedure Laterality Date  . Tubal ligation    . Tee without cardioversion N/A 09/06/2012    Procedure: TRANSESOPHAGEAL ECHOCARDIOGRAM (TEE);  Surgeon: [name], MD;  Location: [facility] ENDOSCOPY;  Service: Cardiovascular;  Laterality: N/A;  . Cardioversion N/A 09/06/2012    Procedure: CARDIOVERSION;  Surgeon: [name], MD;  Location: [facility] ENDOSCOPY;  Service: Cardiovascular;  Laterality: N/A;  . Tee without cardioversion N/A 11/01/2012    Procedure: TRANSESOPHAGEAL  ECHOCARDIOGRAM (TEE);  Surgeon: [name], MD;  Location: [place];  Service: Cardiovascular;  Laterality: N/A;  . Cardioversion N/A 01/07/2013    Procedure: CARDIOVERSION;  Surgeon: [name], MD;  Location: [facility] ENDOSCOPY;  Service: Cardiovascular;  Laterality: N/A;  . Atrial fibrillation ablation  11/02/12    PVI by Dr [name]  . Cardioversion N/A 11/14/2013    Procedure: CARDIOVERSION;  Surgeon: [name], MD;  Location: [facility] ENDOSCOPY;  Service: Cardiovascular;  Laterality: N/A;  . Atrial fibrillation ablation N/A 11/02/2012    Procedure: ATRIAL FIBRILLATION ABLATION;  Surgeon: [name], MD;  Location: [facility] CATH LAB;  Service: Cardiovascular;  Laterality: N/A;   Family History  Problem Relation Age of Onset  . Heart attack Mother   . Diabetes type II Father    History  Substance Use Topics  . Smoking status: Former Smoker    Quit date: 07/21/1994  . Smokeless tobacco: Never Used  . Alcohol Use: No   OB History    No data available     Review of Systems  Constitutional: Positive for activity change, appetite change and fatigue. Negative for fever.  HENT: Negative for congestion and rhinorrhea.   Respiratory: Negative for cough, chest tightness and shortness of breath.   Cardiovascular: Negative for chest pain.  Gastrointestinal: Positive for blood in stool, hematochezia and anal bleeding. Negative for nausea, vomiting and abdominal pain.  Genitourinary: Negative for dysuria, hematuria, vaginal bleeding and vaginal discharge.  Musculoskeletal: Negative for back pain, arthralgias and neck pain.  Skin:  Negative for rash.  Neurological: Positive for dizziness, weakness and light-headedness.  A complete 10 system review of systems was obtained and all systems are negative except as noted in the HPI and PMH.      Allergies  Benicar; Latex; Other; and Tikosyn  Home Medications   Prior to Admission medications   Medication Sig Start Date End Date Taking?  Authorizing Provider  amiodarone (PACERONE) 200 MG tablet TAKE 2 TABLETS BY MOUTH TWICE DAILY 06/21/14   [name], MD  amitriptyline (ELAVIL) 25 MG tablet  07/02/14   Historical Provider, MD  apixaban (ELIQUIS) 5 MG TABS tablet Take 1 tablet (5 mg total) by mouth 2 (two) times daily. 05/23/14   Deboraha Sprang, MD  atorvastatin (LIPITOR) 10 MG tablet Take 10 mg by mouth daily. 04/24/13   Historical Provider, MD  CARTIA XT 120 MG 24 hr capsule Take 120 mg by mouth daily. 04/28/14   Historical Provider, MD  cetirizine (ZYRTEC) 10 MG tablet Take 10 mg by mouth daily.    Historical Provider, MD  CHOLECALCIFEROL PO Take 1 tablet by mouth daily.    Historical Provider, MD  famotidine (PEPCID) 20 MG tablet Take 20 mg by mouth 2 (two) times daily.    Historical Provider, MD  folic acid (FOLVITE) 1 MG tablet Take 1 mg by mouth daily.    Historical Provider, MD  furosemide (LASIX) 40 MG tablet Take 40 mg by mouth daily.    Historical Provider, MD  HYDROcodone-acetaminophen (NORCO/VICODIN) 5-325 MG per tablet Take 1 tablet by mouth every 6 (six) hours as needed (pain).  12/26/13   Historical Provider, MD  ibuprofen (ADVIL,MOTRIN) 200 MG tablet Take 3 tablets (600 mg total) by mouth every 8 (eight) hours as needed for mild pain or moderate pain. 05/06/14   Liliane Shi, PA-C  metFORMIN (GLUCOPHAGE) 1000 MG tablet Take 1,000 mg by mouth 2 (two) times daily with a meal.    Historical Provider, MD  Methotrexate Sodium, PF, 250 MG/10ML SOLN Inject 0.8 mLs as directed every 7 (seven) days. Thursdays    Historical Provider, MD  Multiple Vitamin (MULTIVITAMIN WITH MINERALS) TABS Take 1 tablet by mouth daily.    Historical Provider, MD  NOVOLIN 70/30 RELION (70-30) 100 UNIT/ML injection Inject as directed (sliding scale) 09/13/13   Historical Provider, MD  omeprazole (PRILOSEC) 20 MG capsule  05/12/14   Historical Provider, MD  potassium chloride SA (K-DUR,KLOR-CON) 20 MEQ tablet Take 1 tablet (20 mEq total) by mouth  daily. 03/15/14   [name], MD  predniSONE (DELTASONE) 5 MG tablet Take 2.5 mg by mouth daily.  11/01/13   Historical Provider, MD  sertraline (ZOLOFT) 100 MG tablet  07/01/14   Historical Provider, MD  sulfaSALAzine (AZULFIDINE) 500 MG tablet  07/24/14   Historical Provider, MD  vitamin B-12 (CYANOCOBALAMIN) 1000 MCG tablet Take 1,000 mcg by mouth daily.    Historical Provider, MD  zolpidem (AMBIEN) 10 MG tablet Take 10 mg by mouth at bedtime.  10/12/12   Historical Provider, MD   BP 121/69 mmHg  Pulse 82  Temp(Src) 97.9 F (36.6 C) (Oral)  Resp 22  Ht 5\' 2"  (1.575 m)  Wt 184 lb 8 oz (83.689 kg)  BMI 33.74 kg/m2  SpO2 96% Physical Exam  Constitutional: She is oriented to person, place, and time. She appears well-developed and well-nourished. No distress.  Pale appearing Conjunctival pallor  HENT:  Head: Normocephalic and atraumatic.  Mouth/Throat: Oropharynx is clear and moist. No oropharyngeal exudate.  Eyes: Conjunctivae and EOM are normal. Pupils are equal, round, and reactive to light.  Neck: Normal range of motion. Neck supple.  No meningismus.  Cardiovascular: Normal rate, regular rhythm, normal heart sounds and intact distal pulses.   No murmur heard. Pulmonary/Chest: Effort normal and breath sounds normal. No respiratory distress.  Abdominal: Soft. There is no tenderness. There is no rebound and no guarding.  Genitourinary: Guaiac positive stool.  Brown stool with streaks of pink Chaperone present No hemorrhoids  Musculoskeletal: Normal range of motion. She exhibits no edema or tenderness.  Neurological: She is alert and oriented to person, place, and time. No cranial nerve deficit. She exhibits normal muscle tone. Coordination normal.  No ataxia on finger to nose bilaterally. No pronator drift. 5/5 strength throughout. CN 2-12 intact. Negative Romberg. Equal grip strength. Sensation intact. Gait is normal.   Skin: Skin is warm.  Psychiatric: She has a normal mood  and affect. Her behavior is normal.  Nursing note and vitals reviewed.   ED Course  Procedures (including critical care time) Labs Review Labs Reviewed  CBC WITH DIFFERENTIAL - Abnormal; Notable for the following:    RBC 2.68 (*)    Hemoglobin 6.7 (*)    HCT 21.7 (*)    MCH 25.0 (*)    RDW 19.7 (*)    Platelets 484 (*)    Neutrophils Relative % 87 (*)    Neutro Abs 8.5 (*)    Lymphocytes Relative 10 (*)    All other components within normal limits  COMPREHENSIVE METABOLIC PANEL - Abnormal; Notable for the following:    Glucose, Bld 170 (*)    Total Bilirubin 0.1 (*)    GFR calc non Af Amer 55 (*)    GFR calc Af Amer 64 (*)    All other components within normal limits  PROTIME-INR - Abnormal; Notable for the following:    Prothrombin Time 16.3 (*)    All other components within normal limits  GLUCOSE, CAPILLARY - Abnormal; Notable for the following:    Glucose-Capillary 154 (*)    All other components within normal limits  I-STAT CHEM 8, ED - Abnormal; Notable for the following:    BUN 25 (*)    Glucose, Bld 172 (*)    Calcium, Ion 1.12 (*)    Hemoglobin 7.5 (*)    HCT 22.0 (*)    All other components within normal limits  POC OCCULT BLOOD, ED - Abnormal; Notable for the following:    Fecal Occult Bld POSITIVE (*)    All other components within normal limits  I-STAT CG4 LACTIC ACID, ED - Abnormal; Notable for the following:    Lactic Acid, Venous 3.95 (*)    All other components within normal limits  APTT  TSH  URINALYSIS, ROUTINE W REFLEX MICROSCOPIC  CBC  BASIC METABOLIC PANEL  TYPE AND SCREEN  PREPARE RBC (CROSSMATCH)    Imaging Review No results found.   EKG Interpretation   Date/Time:  Wednesday July 26 2014 14:11:24 EST Ventricular Rate:  79 PR Interval:  176 QRS Duration: 131 QT Interval:  408 QTC Calculation: 468 R Axis:   41 Text Interpretation:  Sinus rhythm Right bundle branch block No  significant change was found Confirmed by Wyvonnia Dusky   MD, Toniann Dickerson [phone]) on  07/26/2014 2:22:13 PM      MDM   Final diagnoses:  Gastrointestinal hemorrhage associated with anorectal source   Rectal bleeding on anticoagulation. Vital stable. No abdominal pain.  Hemoglobin 6.7 Baseline  appears to be around 8.5  Hemoccult positive. Vital stable. No distress. No abdominal pain. Patient given IV fluids gently, PPI. her EF is around 55%.  Discussed with Nancy Marus of GI who will consult.  Rbcs ordered.  Vitals remain stable in the ED.  Patient agreeable to blood transfusion.  Hold eliquis.  Admission d/w Dr. Coralyn Pear.      Ezequiel Essex, MD 07/26/14 [phone]

## 2014-07-26 NOTE — ED Notes (Signed)
Pt in with family reporting three episodes of rectal bleeding since yesterday, blood is bright red, states she was recently started on a new medication for her arthritis that they think is causing this, pt also on blood thinner, no distress noted, denies pain

## 2014-07-26 NOTE — Consult Note (Signed)
[facility] Consult: 2:31 PM 07/26/2014  LOS: 0 days    Referring Provider: Dr Wyvonnia Dusky in ED  Primary Care Physician:  [name], MD Primary Gastroenterologist:  unassigned    Reason for Consultation:  Painless hematochezia.    HPI: Alison Bell is a 69 y.o. female.  Hx IDDM rheumatoid arthritis. On Eliquis for hx Afib.  Diastolic heart failure. Non ischemic cardiomyopathy with latest EF normal in 04/2014. Anemia noted in 04/2014 with Hgb drop from 10 to 7.5, attributed to blood loss from fall related hip hematoma.  Did not require transfusion.  Hgb 8.1 06/05/2014.    Beginning yesterday morning, the patient had the first episode of hematochezia. She did not have any accompanying abdominal pain or nausea.  Later that day she had a second episode. Her third and, thus far, final episode was this morning when the stool was still bloody. She felt a little bit dizzy with standing but otherwise has not had any symptoms suggestive of orthostatic hypotension.  Her Hgb is 6.7, MCV is normal.  BUN is 25, same as in mid 04/2014.   On pelvic CT in 04/2014, minimal scattered diverticulosis noted. On remote 03/2000 ultrasound had fatty liver.  She has never undergone upper endoscopy or colonoscopy. In November 2015 stool Hemoccults were submitted to Dr. Linnell Fulling  office and these were negative for occult blood.  Patient denies ever seeing even scant amounts of blood per rectum prior to yesterday. Her appetite is good, though she has dropped about 15 pounds in the last couple of months. She has not been dieting. No dysphagia. No history of GI distress or abdominal pains.  On 07/24/13 she started prednisone 5 mg daily for treatment of symptomatic arthritis with significant pain in the hands and knees. She also uses Tylenol when  necessary for pain but is not using any nonsteroidals nor any aspirin.  Since the spring of 2015 she has been self administering weekly methotrexate injections.    Past Medical History  Diagnosis Date  . Diabetes mellitus without complication   . Persistent atrial fibrillation 08/25/2012  . HTN (hypertension) 08/25/2012  . Hyperlipidemia 08/25/2012  . Depression   . Shortness of breath   . GERD (gastroesophageal reflux disease)   . LV dysfunction, EF 45-50% due to a. fib per echo 08/27/12  08/28/2012    a. Echo (10/15):  EF 55-60%, no RWMA, Gr 2 DD, mild MR, mod LAE, normal RVF, mild RAE, mild TR (LA 44 mm)  . Atrial enlargement, left   . Torsades de pointes     due to Tikosyn 04/2014    Past Surgical History  Procedure Laterality Date  . Tubal ligation    . Tee without cardioversion N/A 09/06/2012    Procedure: TRANSESOPHAGEAL ECHOCARDIOGRAM (TEE);  Surgeon: [name], MD;  Location: [facility] ENDOSCOPY;  Service: Cardiovascular;  Laterality: N/A;  . Cardioversion N/A 09/06/2012    Procedure: CARDIOVERSION;  Surgeon: [name], MD;  Location: [facility] ENDOSCOPY;  Service: Cardiovascular;  Laterality: N/A;  . Tee without cardioversion N/A 11/01/2012  Procedure: TRANSESOPHAGEAL ECHOCARDIOGRAM (TEE);  Surgeon: [name], MD;  Location: [place];  Service: Cardiovascular;  Laterality: N/A;  . Cardioversion N/A 01/07/2013    Procedure: CARDIOVERSION;  Surgeon: [name], MD;  Location: [facility] ENDOSCOPY;  Service: Cardiovascular;  Laterality: N/A;  . Atrial fibrillation ablation  11/02/12    PVI by Dr [name]  . Cardioversion N/A 11/14/2013    Procedure: CARDIOVERSION;  Surgeon: [name], MD;  Location: [facility] ENDOSCOPY;  Service: Cardiovascular;  Laterality: N/A;  . Atrial fibrillation ablation N/A 11/02/2012    Procedure: ATRIAL FIBRILLATION ABLATION;  Surgeon: [name], MD;  Location: [facility] CATH LAB;  Service: Cardiovascular;  Laterality: N/A;    Prior to Admission medications     Medication Sig Start Date End Date Taking? Authorizing Provider  amiodarone (PACERONE) 200 MG tablet TAKE 2 TABLETS BY MOUTH TWICE DAILY 06/21/14   [name], MD  apixaban (ELIQUIS) 5 MG TABS tablet Take 1 tablet (5 mg total) by mouth 2 (two) times daily. 05/23/14   Deboraha Sprang, MD  atorvastatin (LIPITOR) 10 MG tablet Take 10 mg by mouth daily. 04/24/13   Historical Provider, MD  CARTIA XT 120 MG 24 hr capsule Take 120 mg by mouth daily. 04/28/14   Historical Provider, MD  cetirizine (ZYRTEC) 10 MG tablet Take 10 mg by mouth daily.    Historical Provider, MD  CHOLECALCIFEROL PO Take 1 tablet by mouth daily.    Historical Provider, MD  famotidine (PEPCID) 20 MG tablet Take 20 mg by mouth 2 (two) times daily.    Historical Provider, MD  folic acid (FOLVITE) 1 MG tablet Take 1 mg by mouth daily.    Historical Provider, MD  furosemide (LASIX) 40 MG tablet Take 40 mg by mouth daily.    Historical Provider, MD  HYDROcodone-acetaminophen (NORCO/VICODIN) 5-325 MG per tablet Take 1 tablet by mouth every 6 (six) hours as needed (pain).  12/26/13   Historical Provider, MD         metFORMIN (GLUCOPHAGE) 1000 MG tablet Take 1,000 mg by mouth 2 (two) times daily with a meal.    Historical Provider, MD  Methotrexate Sodium, PF, 250 MG/10ML SOLN Inject 0.8 mLs as directed every 7 (seven) days. Thursdays    Historical Provider, MD  Multiple Vitamin (MULTIVITAMIN WITH MINERALS) TABS Take 1 tablet by mouth daily.    Historical Provider, MD  NOVOLIN 70/30 RELION (70-30) 100 UNIT/ML injection Inject as directed (sliding scale) 09/13/13   Historical Provider, MD  potassium chloride SA (K-DUR,KLOR-CON) 20 MEQ tablet Take 1 tablet (20 mEq total) by mouth daily. 03/15/14   [name], MD  predniSONE (DELTASONE) 5 MG tablet Take 2.5 mg by mouth daily.  11/01/13   Historical Provider, MD  vitamin B-12 (CYANOCOBALAMIN) 1000 MCG tablet Take 1,000 mcg by mouth daily.    Historical Provider, MD  zolpidem (AMBIEN) 10 MG  tablet Take 10 mg by mouth at bedtime.  10/12/12   Historical Provider, MD    Scheduled Meds:   Infusions: . sodium chloride 1,000 mL (07/26/14 1408)   PRN Meds:    Allergies as of 07/26/2014 - Review Complete 07/26/2014  Allergen Reaction Noted  . Benicar [olmesartan] Anaphylaxis 08/25/2012  . Latex Hives 11/14/2013  . Other Itching and Swelling 08/25/2012  . Tikosyn [dofetilide] Other (See Comments) 05/06/2014    Family History  Problem Relation Age of Onset  . Heart attack Mother   . Diabetes type II Father     History  Social History  . Marital Status: Married    Spouse Name: N/A    Number of Children: N/A  . Years of Education: N/A   Occupational History  . Not on file.   Social History Main Topics  . Smoking status: Former Smoker    Quit date: 07/21/1994  . Smokeless tobacco: Never Used  . Alcohol Use: No  . Drug Use: No  . Sexual Activity: Yes   Other Topics Concern  . Not on file   Social History Narrative   Lives in [place] with spouse.  Retired          REVIEW OF SYSTEMS: Constitutional:  Since October 2015 she's probably lost about 15 pounds. This has not been due to any effort on her part she feels like she is consuming about the same amount of food as she always does. ENT:  No nose bleeds Pulm:  No cough, no DOE or respiratory distress. CV:  No palpitations, no LE edema. No chest pain. GU:  No hematuria, no frequency GI:  No dysphagia, no heartburn. Heme:  Per HPI   Transfusions:  She has never been transfused before. Neuro:  No headaches, no peripheral tingling or numbness MS:  Patient noted immediate improvement in her joint pain after starting prednisone a couple of days ago. Derm:  No itching, no rash or sores.  Endocrine:  No sweats or chills.  No polyuria or dysuria Immunization:  Reviewed. She had flu vaccination in October 2015 Travel:  None beyond local counties in last few months.    PHYSICAL EXAM: Vital signs in last  24 hours: Filed Vitals:   07/26/14 1400  BP: 131/62  Pulse: 80  Temp:   Resp: 16   Wt Readings from Last 3 Encounters:  07/26/14 181 lb (82.101 kg)  06/05/14 193 lb 9.6 oz (87.816 kg)  05/23/14 187 lb 12.8 oz (85.186 kg)   General: Pleasant, somewhat pale, non-ill appearing older white female. Head:  No signs of head trauma, no facial edema or asymmetry.  Eyes:  No scleral icterus. Positive for conjunctival pallor. EOMI. Ears:  No hearing deficit.  Nose:  No congestion or discharge. Mouth:  Dentition in good repair. Mucous membranes moist and clear. Neck:  No JVD, no TMG, no masses. Lungs:  CTA bil.  No cough, no dyspnea. Heart: RRR. No MRG. Abdomen:  Soft. NT, ND. No organomegaly. No bruits..   Rectal: Deferred. Per ED physician the stool is brown with some scant blood surrounding.   Musc/Skeltl: No joint erythema, contractures or gross deformities. Extremities:  No CCE.  Neurologic:  Oriented 3, alert. No limb weakness or asymmetric strength. No tremor. Skin:  No telangiectasia, rash or sores. Tattoos:  None Nodes:  No cervical adenopathy.   Psych:  Pleasant, cooperative, in good spirits.  Intake/Output from previous day:   Intake/Output this shift:    LAB RESULTS:  Recent Labs  07/26/14 1310 07/26/14 1405  WBC 9.8  --   HGB 6.7* 7.5*  HCT 21.7* 22.0*  PLT 484*  --    BMET Lab Results  Component Value Date   NA 135 07/26/2014   NA 140 05/06/2014   NA 136* 05/05/2014   K 4.3 07/26/2014   K 4.3 05/06/2014   K 3.6* 05/05/2014   CL 100 07/26/2014   CL 103 05/06/2014   CL 95* 05/05/2014   CO2 23 05/06/2014   CO2 21 05/05/2014   CO2 26 05/04/2014   GLUCOSE 172* 07/26/2014   GLUCOSE  172* 05/06/2014   GLUCOSE 348* 05/05/2014   BUN 25* 07/26/2014   BUN 22 05/06/2014   BUN 25* 05/05/2014   CREATININE 1.00 07/26/2014   CREATININE 0.98 05/06/2014   CREATININE 1.18* 05/05/2014   CALCIUM 9.2 05/06/2014   CALCIUM 9.4 05/05/2014   CALCIUM 9.5 05/04/2014    LFT No results for input(s): PROT, ALBUMIN, AST, ALT, ALKPHOS, BILITOT, BILIDIR, IBILI in the last 72 hours. PT/INR Lab Results  Component Value Date   INR 1.29 07/26/2014   INR 1.22 05/01/2014   INR 1.25 01/06/2013    RADIOLOGY STUDIES: No results found.  ENDOSCOPIC STUDIES: None ever.  IMPRESSION:   *  Painless hematochezia. Suspect diverticular bleed.  *  Acute blood loss superimposed on chronic anemia. Normocytic.  She may have an underlying issue with anemia of chronic disease.  *  Chronic Eliquis. Took her last dose this morning. Per discussion with cardiology the washout period is 48 hours.  *  IDDM  *  Rheumatoid arthritis.  On weekly methotrexate injections, low dose prednisone initiated 2 days ago.      PLAN:     *  Plan colonoscopy for 07/28/2013.  Added possible upper endoscopy to the procedure orders in case no clear source for bleeding is found on the colonoscopy.  Keep her on clears for now. Begin split dose bowel prep tomorrow evening.  *  Hold Eliquis.  *  The first of 2 units of packed red blood cells has begun to infuse.  Recheck CBC following completion of transfusions.   Azucena Freed  07/26/2014, 2:31 PM Pager: [phone]

## 2014-07-26 NOTE — ED Notes (Signed)
Attempted report 

## 2014-07-26 NOTE — H&P (Signed)
[facility] History and Physical  Alison Bell WUJ:[id number] DOB: May 13, 1946 DOA: 07/26/2014  Referring physician:  PCP: [name], MD   Chief Complaint: Bloody Stools  HPI: Alison Bell is a 69 y.o. female with a past medical history of atrial fibrillation, currently on chronic anticoagulation with Eliquis, hypertension, type 2 diabetes mellitus, presenting to the emergency department with complaints of bright red blood per rectum which started yesterday. She reports having 3 episodes of rectal bleeding in the last 24 hours without associated hemoptysis, hematemesis, abdominal pain, nausea, vomiting, fevers, chills. Patient denies current NSAID use however takes Prednisone 2.5 mg PO q daily. Patient reporting taking her last dose of Eliquis this morning. She denies having previous episodes of GI bleed and had never undergone  EGD or colonosocpy.  In the emergency room she was found to have a hemoglobin of 6.7 with hematocrit of 21.7. She was typed and crossed has 2 units of packed red blood cells were ordered.                                                                                                                                                                                                                                                                                                                                 Review of Systems:  Constitutional:  No weight loss, night sweats, Fevers, chills, fatigue.  HEENT:  No headaches, Difficulty swallowing,Tooth/dental problems,Sore throat,  No sneezing, itching, ear ache, nasal congestion, post nasal drip,  Cardio-vascular:  No chest pain, Orthopnea, PND, swelling in lower extremities, anasarca, dizziness, palpitations  GI:  No heartburn, indigestion, abdominal pain, nausea, vomiting, diarrhea, change in bowel habits, loss of appetite  Resp:  No shortness of breath with exertion or at rest. No excess mucus, no productive  cough, No non-productive cough, No coughing up of blood.No change in color of mucus.No wheezing.No chest wall deformity  Skin:  no rash or lesions.  GU:  no dysuria, change in color of urine, no urgency or frequency. No flank pain.  Musculoskeletal:  No joint pain or swelling. No decreased range of motion. No back pain.  Psych:  No change in mood or affect. No depression or anxiety. No memory loss.   Past Medical History  Diagnosis Date  . Diabetes mellitus without complication   . Persistent atrial fibrillation 08/25/2012  . HTN (hypertension) 08/25/2012  . Hyperlipidemia 08/25/2012  . Depression   . Shortness of breath   . GERD (gastroesophageal reflux disease)   . LV dysfunction, EF 45-50% due to a. fib per echo 08/27/12  08/28/2012    a. Echo (10/15):  EF 55-60%, no RWMA, Gr 2 DD, mild MR, mod LAE, normal RVF, mild RAE, mild TR (LA 44 mm)  . Atrial enlargement, left   . Torsades de pointes     due to Tikosyn 04/2014   Past Surgical History  Procedure Laterality Date  . Tubal ligation    . Tee without cardioversion N/A 09/06/2012    Procedure: TRANSESOPHAGEAL ECHOCARDIOGRAM (TEE);  Surgeon: [name], MD;  Location: [facility] ENDOSCOPY;  Service: Cardiovascular;  Laterality: N/A;  . Cardioversion N/A 09/06/2012    Procedure: CARDIOVERSION;  Surgeon: [name], MD;  Location: [facility] ENDOSCOPY;  Service: Cardiovascular;  Laterality: N/A;  . Tee without cardioversion N/A 11/01/2012    Procedure: TRANSESOPHAGEAL ECHOCARDIOGRAM (TEE);  Surgeon: [name], MD;  Location: [place];  Service: Cardiovascular;  Laterality: N/A;  . Cardioversion N/A 01/07/2013    Procedure: CARDIOVERSION;  Surgeon: [name], MD;  Location: [facility] ENDOSCOPY;  Service: Cardiovascular;  Laterality: N/A;  . Atrial fibrillation ablation  11/02/12    PVI by Dr [name]  . Cardioversion N/A 11/14/2013    Procedure: CARDIOVERSION;  Surgeon: [name], MD;  Location: [facility] ENDOSCOPY;  Service: Cardiovascular;   Laterality: N/A;  . Atrial fibrillation ablation N/A 11/02/2012    Procedure: ATRIAL FIBRILLATION ABLATION;  Surgeon: [name], MD;  Location: [facility] CATH LAB;  Service: Cardiovascular;  Laterality: N/A;   Social History:  reports that she quit smoking about 20 years ago. She has never used smokeless tobacco. She reports that she does not drink alcohol or use illicit drugs.  Allergies  Allergen Reactions  . Benicar [Olmesartan] Anaphylaxis  . Latex Hives  . Other Itching and Swelling    White meat chicken causes hands and feet to itch and swell.  Phyllis Ginger [Dofetilide] Other (See Comments)    Torsades after 1 dose of Tikosyn without significant QT prolongation    Family History  Problem Relation Age of Onset  . Heart attack Mother   . Diabetes type II Father      Prior to Admission medications   Medication Sig Start Date End Date Taking? Authorizing Provider  amiodarone (PACERONE) 200 MG tablet TAKE 2 TABLETS BY MOUTH TWICE DAILY 06/21/14   [name], MD  apixaban (ELIQUIS) 5 MG TABS tablet Take 1 tablet (5 mg total) by mouth 2 (two) times daily. 05/23/14   Deboraha Sprang, MD  atorvastatin (LIPITOR) 10 MG tablet Take 10 mg by mouth daily. 04/24/13   Historical Provider, MD  CARTIA XT 120 MG 24 hr capsule Take 120 mg by mouth daily. 04/28/14   Historical Provider, MD  cetirizine (ZYRTEC) 10 MG tablet Take 10 mg by mouth daily.    Historical Provider, MD  CHOLECALCIFEROL PO Take 1 tablet by mouth daily.    Historical Provider, MD  famotidine (PEPCID) 20  MG tablet Take 20 mg by mouth 2 (two) times daily.    Historical Provider, MD  folic acid (FOLVITE) 1 MG tablet Take 1 mg by mouth daily.    Historical Provider, MD  furosemide (LASIX) 40 MG tablet Take 40 mg by mouth daily.    Historical Provider, MD  HYDROcodone-acetaminophen (NORCO/VICODIN) 5-325 MG per tablet Take 1 tablet by mouth every 6 (six) hours as needed (pain).  12/26/13   Historical Provider, MD  ibuprofen (ADVIL,MOTRIN) 200  MG tablet Take 3 tablets (600 mg total) by mouth every 8 (eight) hours as needed for mild pain or moderate pain. 05/06/14   Liliane Shi, PA-C  metFORMIN (GLUCOPHAGE) 1000 MG tablet Take 1,000 mg by mouth 2 (two) times daily with a meal.    Historical Provider, MD  Methotrexate Sodium, PF, 250 MG/10ML SOLN Inject 0.8 mLs as directed every 7 (seven) days. Thursdays    Historical Provider, MD  Multiple Vitamin (MULTIVITAMIN WITH MINERALS) TABS Take 1 tablet by mouth daily.    Historical Provider, MD  NOVOLIN 70/30 RELION (70-30) 100 UNIT/ML injection Inject as directed (sliding scale) 09/13/13   Historical Provider, MD  potassium chloride SA (K-DUR,KLOR-CON) 20 MEQ tablet Take 1 tablet (20 mEq total) by mouth daily. 03/15/14   [name], MD  predniSONE (DELTASONE) 5 MG tablet Take 2.5 mg by mouth daily.  11/01/13   Historical Provider, MD  vitamin B-12 (CYANOCOBALAMIN) 1000 MCG tablet Take 1,000 mcg by mouth daily.    Historical Provider, MD  zolpidem (AMBIEN) 10 MG tablet Take 10 mg by mouth at bedtime.  10/12/12   Historical Provider, MD   Physical Exam: Filed Vitals:   07/26/14 1345 07/26/14 1400 07/26/14 1445 07/26/14 1502  BP: 132/51 131/62 140/55 133/41  Pulse: 79 80 86 87  Temp:    98 F (36.7 C)  TempSrc:    Oral  Resp: 15 16 21 15   Height:      Weight:      SpO2: 99% 97% 96% 98%    Wt Readings from Last 3 Encounters:  07/26/14 82.101 kg (181 lb)  06/05/14 87.816 kg (193 lb 9.6 oz)  05/23/14 85.186 kg (187 lb 12.8 oz)    General:  Appears calm and comfortable Eyes: PERRL, normal lids, irises & conjunctiva ENT: grossly normal hearing, lips & tongue Neck: no LAD, masses or thyromegaly Cardiovascular: RRR, no m/r/g. No LE edema. Telemetry: SR, no arrhythmias  Respiratory: CTA bilaterally, no w/r/r. Normal respiratory effort. Abdomen: soft, ntnd Skin: no rash or induration seen on limited exam Musculoskeletal: grossly normal tone BUE/BLE Psychiatric: grossly normal mood  and affect, speech fluent and appropriate Neurologic: grossly non-focal.          Labs on Admission:  Basic Metabolic Panel:  Recent Labs Lab 07/26/14 1310 07/26/14 1405  NA 136 135  K 4.3 4.3  CL 101 100  CO2 26  --   GLUCOSE 170* 172*  BUN 23 25*  CREATININE 1.01 1.00  CALCIUM 9.2  --    Liver Function Tests:  Recent Labs Lab 07/26/14 1310  AST 32  ALT 25  ALKPHOS 47  BILITOT 0.1*  PROT 7.2  ALBUMIN 3.6   No results for input(s): LIPASE, AMYLASE in the last 168 hours. No results for input(s): AMMONIA in the last 168 hours. CBC:  Recent Labs Lab 07/26/14 1310 07/26/14 1405  WBC 9.8  --   NEUTROABS 8.5*  --   HGB 6.7* 7.5*  HCT 21.7* 22.0*  MCV 81.0  --   PLT 484*  --    Cardiac Enzymes: No results for input(s): CKTOTAL, CKMB, CKMBINDEX, TROPONINI in the last 168 hours.  BNP (last 3 results) No results for input(s): PROBNP in the last 8760 hours. CBG: No results for input(s): GLUCAP in the last 168 hours.  Radiological Exams on Admission: No results found.  EKG: Independently reviewed.   Assessment/Plan Principal Problem:   Lower GI bleed Active Problems:   Acute blood loss anemia   Acute on chronic diastolic congestive heart failure   Atrial fibrillation with RVR, symptomatic - converted to NSR     HTN (hypertension)   NICM (nonischemic cardiomyopathy) - EF 45-50% in 2014 due to AFib - EF normal by echo this admit   1. Probable lower GI bleed. Patient presenting with painless hematochezia in setting of chronic anticoagulation. Previous CT scan of abdomen performed on 05/01/2014 revealed the presence of diverticulosis, making diverticular bleed a possibility. Labs revealed a BUN of 25. GI consulted who evaluated patient in the emergency room. Patient is hemodynamically stable, mentating well, in no acute distress. Plan to transfuse 2 units of packed red blood cells this evening. Will discontinue Eliquis therapy.  2. Acute blood loss anemia.  Secondary to lower GI bleed in setting of anticoagulation. Lab work in the emergency room showing a hemoglobin of 6.7 with hematocrit of 21.6. It seems that she has been anemic over the past several months as I wonder about the possibility of slow GI bleed. She reported being given heme occult cards by her PCP that were negative. Will transfuse 2 untis of PRBC's and repeat CBC in am.  3. Atrial fibrillation. Initial EKG showing sinus rhythm. Given the presence of GI bleed and acute blood loss anemia requiring blood transfusion, will discontinue her anticoagulation. Continue amiodarone 200 mg by mouth twice a day 4. Insulin dependent diabetes mellitus. Patient will be placed on clears overnight, will stop metformin and Insulin 70/30 and cover with sliding scale coverage.  5. Hypertension. Monitor patient's blood pressures overnight if they remain stable could consider restarting her cardia and a.m. 6. DVT prophylaxis. SCDs   Code Status: Full code Family Communication: Spoke to her husband was present at bedside Disposition Plan: Anticipate she'll require with an 2 nights hospitalization, admit to telemetry  Time spent: 70 min  Kelvin Cellar [facility] Pager [phone]

## 2014-07-26 NOTE — ED Notes (Signed)
6.7 hemoglobin critical lab value.

## 2014-07-27 DIAGNOSIS — E876 Hypokalemia: Secondary | ICD-10-CM

## 2014-07-27 LAB — BASIC METABOLIC PANEL
Anion gap: 9 (ref 5–15)
BUN: 19 mg/dL (ref 6–23)
CO2: 27 mmol/L (ref 19–32)
Calcium: 8.8 mg/dL (ref 8.4–10.5)
Chloride: 104 mEq/L (ref 96–112)
Creatinine, Ser: 0.94 mg/dL (ref 0.50–1.10)
GFR calc Af Amer: 70 mL/min — ABNORMAL LOW (ref >90)
GFR calc non Af Amer: 60 mL/min — ABNORMAL LOW (ref >90)
Glucose, Bld: 116 mg/dL — ABNORMAL HIGH (ref 70–99)
Potassium: 3.3 mmol/L — ABNORMAL LOW (ref 3.5–5.1)
Sodium: 140 mmol/L (ref 135–145)

## 2014-07-27 LAB — CBC
HCT: 27.2 % — ABNORMAL LOW (ref 36.0–46.0)
Hemoglobin: 8.8 g/dL — ABNORMAL LOW (ref 12.0–15.0)
MCH: 26.3 pg (ref 26.0–34.0)
MCHC: 32.4 g/dL (ref 30.0–36.0)
MCV: 81.2 fL (ref 78.0–100.0)
Platelets: 378 10*3/uL (ref 150–400)
RBC: 3.35 MIL/uL — ABNORMAL LOW (ref 3.87–5.11)
RDW: 17.5 % — ABNORMAL HIGH (ref 11.5–15.5)
WBC: 8.2 10*3/uL (ref 4.0–10.5)

## 2014-07-27 LAB — GLUCOSE, CAPILLARY
Glucose-Capillary: 151 mg/dL — ABNORMAL HIGH (ref 70–99)
Glucose-Capillary: 95 mg/dL (ref 70–99)
Glucose-Capillary: 110 mg/dL — ABNORMAL HIGH (ref 70–99)
Glucose-Capillary: 137 mg/dL — ABNORMAL HIGH (ref 70–99)
Glucose-Capillary: 176 mg/dL — ABNORMAL HIGH (ref 70–99)
Glucose-Capillary: 146 mg/dL — ABNORMAL HIGH (ref 70–99)

## 2014-07-27 MED ORDER — BISACODYL 5 MG PO TBEC
5.0000 mg | DELAYED_RELEASE_TABLET | Freq: Once | ORAL | Status: AC
  Administered 2014-07-27: 5 mg via ORAL
  Filled 2014-07-27: qty 1

## 2014-07-27 MED ORDER — AMIODARONE HCL 200 MG PO TABS
200.0000 mg | ORAL_TABLET | Freq: Every day | ORAL | Status: DC
  Administered 2014-07-27 – 2014-07-29 (×3): 200 mg via ORAL
  Filled 2014-07-27 (×3): qty 1

## 2014-07-27 MED ORDER — PEG-KCL-NACL-NASULF-NA ASC-C 100 G PO SOLR
0.5000 | Freq: Once | ORAL | Status: AC
  Administered 2014-07-28: 100 g via ORAL

## 2014-07-27 MED ORDER — PEG-KCL-NACL-NASULF-NA ASC-C 100 G PO SOLR: 1.0000 | Freq: Once | ORAL | Status: DC

## 2014-07-27 MED ORDER — PEG-KCL-NACL-NASULF-NA ASC-C 100 G PO SOLR
0.5000 | Freq: Once | ORAL | Status: AC
  Administered 2014-07-27: 100 g via ORAL
  Filled 2014-07-27: qty 1

## 2014-07-27 MED ORDER — POTASSIUM CHLORIDE CRYS ER 20 MEQ PO TBCR
40.0000 meq | EXTENDED_RELEASE_TABLET | Freq: Once | ORAL | Status: AC
  Administered 2014-07-27: 40 meq via ORAL
  Filled 2014-07-27: qty 2

## 2014-07-27 MED ORDER — AMIODARONE HCL 200 MG PO TABS: 200.0000 mg | ORAL_TABLET | Freq: Every day | ORAL | Status: DC

## 2014-07-27 NOTE — Progress Notes (Signed)
Blood transfusion completed at 0435. Patient tolerated blood transfusion with no adverse reactions. Will continue to monitor patient to end of shift.

## 2014-07-27 NOTE — Progress Notes (Signed)
PROGRESS NOTE  Alison Bell RDE:[id number] DOB: June 17, 1946 DOA: 07/26/2014 PCP: [name], MD  Brief history 69 year old female with a history of atrial fibrillation on liquids, hypertension, diabetes mellitus subcutaneous, and rheumatoid arthritis presents with one-day history of bright red blood per rectum. The patient reported 3 episodes of rectal bleeding prior to coming to the emergency department. She denied any NSAIDs. The patient is prednisone 5 mg daily for her rheumatoid arthritis. The prednisone dose was just increased on 07/24/2014 from previous dose of 2.5 mg. In addition, she was started on Azulfidine on 07/24/2014. The patient was transfused 2 units PRBC. Assessment/Plan: Acute blood loss anemia -Secondary to lower GI bleed -Transfuse 2 units PRBC -No further hematochezia since admission -Monitor hemoglobin Hematochezia/LGIB -Appreciate GI follow-up -Plan for colonoscopy 07/28/2014 -defer to GI when Eliquis can be restarted if ever pending colonoscopy Atrial fibrillation -Continue amiodarone home dose 200 mg daily -Dosing was verified with the patient's husband at the bedside -Presently in sinus -Discontinue Eliquis -TSH 0.336 -Check free T4 Diabetes mellitus type 2 -Discontinue metformin -Hold 70/30 insulin -05/02/2014 hemoglobin A1c 6.6 Hypertension -Blood pressure remains stable off Cartia -monitor BP off anti-HTN  Chronic systolic and diastolic CHF  -Clinically compensated  -Continue home dose of furosemide po -dosing verified with pt's husband at bedside Hypokalemia -replete -check mag   Family Communication:  Husband updated at beside Disposition Plan:   Home when medically stable        Procedures/Studies:  No results found.      Subjective: Patient denies fevers, chills, headache, chest pain, dyspnea, nausea, vomiting, diarrhea, abdominal pain, dysuria, hematuria. No further hematochezia today  Objective: Filed  Vitals:   07/27/14 0435 07/27/14 0652 07/27/14 0942 07/27/14 1514  BP: 125/49  133/47 142/52  Pulse: 72  69 64  Temp: 97.9 F (36.6 C)   97.4 F (36.3 C)  TempSrc: Oral   Oral  Resp: _0 Height:      Weight:  82.01 kg (180 lb 12.8 oz)    SpO2: 96%  96% 96%    Intake/Output Summary (Last 24 hours) at 07/27/14 1842 Last data filed at 07/27/14 1749  Gross per 24 hour  Intake   2606 ml  Output   4675 ml  Net  -2069 ml   Weight change:  Exam:   General:  Pt is alert, follows commands appropriately, not in acute distress  HEENT: No icterus, No thrush,  [place]/AT  Cardiovascular: RRR, S1/S2, no rubs, no gallops  Respiratory: CTA bilaterally, no wheezing, no crackles, no rhonchi  Abdomen: Soft/+BS, non tender, non distended, no guarding  Extremities: trace LE edema, No lymphangitis, No petechiae, No rashes, no synovitis  Data Reviewed: Basic Metabolic Panel:  Recent Labs Lab 07/26/14 1310 07/26/14 1405 07/27/14 0520  NA 136 135 140  K 4.3 4.3 3.3*  CL 101 100 104  CO2 26  --  27  GLUCOSE 170* 172* 116*  BUN 23 25* 19  CREATININE 1.01 1.00 0.94  CALCIUM 9.2  --  8.8   Liver Function Tests:  Recent Labs Lab 07/26/14 1310  AST 32  ALT 25  ALKPHOS 47  BILITOT 0.1*  PROT 7.2  ALBUMIN 3.6   No results for input(s): LIPASE, AMYLASE in the last 168 hours. No results for input(s): AMMONIA in the last 168 hours. CBC:  Recent Labs Lab 07/26/14 1310 07/26/14 1405 07/27/14 0931  WBC 9.8  --  8.2  NEUTROABS 8.5*  --   --   HGB 6.7* 7.5* 8.8*  HCT 21.7* 22.0* 27.2*  MCV 81.0  --  81.2  PLT 484*  --  378   Cardiac Enzymes: No results for input(s): CKTOTAL, CKMB, CKMBINDEX, TROPONINI in the last 168 hours. BNP: Invalid input(s): POCBNP CBG:  Recent Labs Lab 07/27/14 0314 07/27/14 0650 07/27/14 0800 07/27/14 1143 07/27/14 1619  GLUCAP 151* 95 110* 137* 176*    No results found for this or any previous visit (from the past 240 hour(s)).    Scheduled Meds: . sodium chloride   Intravenous Once  . amiodarone  200 mg Oral Daily  . atorvastatin  10 mg Oral Daily  . furosemide  40 mg Oral Daily  . insulin aspart  0-15 Units Subcutaneous 6 times per day  . loratadine  10 mg Oral Daily  . pantoprazole  40 mg Oral Daily  . peg 3350 powder  0.5 kit Oral Once   And  . [START ON 07/28/2014] peg 3350 powder  0.5 kit Oral Once  . predniSONE  2.5 mg Oral Daily  . sodium chloride  3 mL Intravenous Q12H  . sodium chloride  3 mL Intravenous Q12H   Continuous Infusions:    Cheveyo Virginia, DO  [facility] Pager [phone]  If 7PM-7AM, please contact night-coverage [email or web address] Password [email or web address] 07/27/2014, 6:42 PM   LOS: 1 day

## 2014-07-27 NOTE — Progress Notes (Signed)
Patient noted small compressible lump on forearm.  States that she does not recall bumping it.  Lump is not red, itchy, or painful upon assessment.  Night RN made aware.  Will continue to monitor.

## 2014-07-27 NOTE — Progress Notes (Signed)
Blood transfusion has been started. Nurse in room during first 24min of blood. Patient states she has no signs or symptoms of any back pain, shortness of breath, difficulty breathing, or itching. Will continue to monitor to end of shift.

## 2014-07-27 NOTE — Progress Notes (Signed)
UR Completed.  [phone]  

## 2014-07-27 NOTE — Progress Notes (Signed)
Patient's husband brought in home med list, confirmed that patient takes 200mg  amiodarone once daily, not 400mg  twice daily.  Dr. Carles Collet and pharmacy made aware.  Home dose ordered.  Patient and husband updated.  Will continue to monitor.

## 2014-07-27 NOTE — Progress Notes (Signed)
          Daily Rounding Note  07/27/2014, 11:58 AM  LOS: 1 day   SUBJECTIVE:       No bleeding or BMs since arrived in ED.  Feels better after her 2 PRBCs.  Hungry on clears.   OBJECTIVE:         Vital signs in last 24 hours:    Temp:  [97.5 F (36.4 C)-98.4 F (36.9 C)] 97.9 F (36.6 C) (01/07 0435) Pulse Rate:  [69-98] 69 (01/07 0942) Resp:  [15-24] 18 (01/07 0942) BP: (114-141)/(41-98) 133/47 mmHg (01/07 0942) SpO2:  [94 %-100 %] 96 % (01/07 0942) Weight:  [180 lb 12.8 oz (82.01 kg)-184 lb 8 oz (83.689 kg)] 180 lb 12.8 oz (82.01 kg) (01/07 0652) Last BM Date: 07/26/14 Filed Weights   07/26/14 1307 07/26/14 1636 07/27/14 6440  Weight: 181 lb (82.101 kg) 184 lb 8 oz (83.689 kg) 180 lb 12.8 oz (82.01 kg)   General: looks well,  No longer pale.  comfortable   Heart: RRR Chest: clear bil.  No dyspnea Abdomen: soft, NT, ND.  Active BS  Extremities: no CCE Neuro/Psych:  Oriented x 3.  Fully alert, in good spirits.  Relaxed   Intake/Output from previous day: 01/06 0701 - 01/07 0700 In: 1533 [P.O.:480; I.V.:3; Blood:1050] Out: 2575 [Urine:2575]  Intake/Output this shift: Total I/O In: 1083 [P.O.:1080; I.V.:3] Out: 950 [Urine:950]  Lab Results:  Recent Labs  07/26/14 1310 07/26/14 1405 07/27/14 0931  WBC 9.8  --  8.2  HGB 6.7* 7.5* 8.8*  HCT 21.7* 22.0* 27.2*  PLT 484*  --  378   BMET  Recent Labs  07/26/14 1310 07/26/14 1405 07/27/14 0520  NA 136 135 140  K 4.3 4.3 3.3*  CL 101 100 104  CO2 26  --  27  GLUCOSE 170* 172* 116*  BUN 23 25* 19  CREATININE 1.01 1.00 0.94  CALCIUM 9.2  --  8.8   LFT  Recent Labs  07/26/14 1310  PROT 7.2  ALBUMIN 3.6  AST 32  ALT 25  ALKPHOS 47  BILITOT 0.1*   PT/INR  Recent Labs  07/26/14 1310  LABPROT 16.3*  INR 1.29    Studies/Results: No results found.  ASSESMENT:   * Painless hematochezia. Suspect diverticular bleed.  Resolved.   * Acute  blood loss superimposed on chronic anemia. Normocytic.  Improved appropriately after PRBCs x 2.  She may have an underlying issue with anemia of chronic disease.  * Chronic Eliquis. Took her last dose AM of 1/6.Marland Kitchen Per discussion with cardiology the washout period is 48 hours.  * IDDM  * Rheumatoid arthritis. On weekly methotrexate injections, low dose prednisone initiated 2 days ago.     PLAN   *  Colonoscopy with possible EGD tomorrow. 1 pm.  Split dose prep tonight.  CBC in AM.     Azucena Freed  07/27/2014, 11:58 AM Pager: [phone]

## 2014-07-28 ENCOUNTER — Inpatient Hospital Stay (HOSPITAL_COMMUNITY): Payer: Medicare HMO | Admitting: Anesthesiology

## 2014-07-28 ENCOUNTER — Encounter (HOSPITAL_COMMUNITY)
Admission: EM | Disposition: A | Discharge status: Home / Self Care | Payer: Self-pay | Source: Home / Self Care | Attending: Internal Medicine

## 2014-07-28 ENCOUNTER — Encounter (HOSPITAL_COMMUNITY): Payer: Self-pay | Admitting: Anesthesiology

## 2014-07-28 DIAGNOSIS — D125 Benign neoplasm of sigmoid colon: Secondary | ICD-10-CM

## 2014-07-28 DIAGNOSIS — K625 Hemorrhage of anus and rectum: Secondary | ICD-10-CM | POA: Insufficient documentation

## 2014-07-28 HISTORY — PX: COLONOSCOPY: SHX5424

## 2014-07-28 LAB — CBC
HCT: 26.9 % — ABNORMAL LOW (ref 36.0–46.0)
Hemoglobin: 8.6 g/dL — ABNORMAL LOW (ref 12.0–15.0)
MCH: 26.1 pg (ref 26.0–34.0)
MCHC: 32 g/dL (ref 30.0–36.0)
MCV: 81.5 fL (ref 78.0–100.0)
Platelets: 428 10*3/uL — ABNORMAL HIGH (ref 150–400)
RBC: 3.3 MIL/uL — ABNORMAL LOW (ref 3.87–5.11)
RDW: 17.8 % — ABNORMAL HIGH (ref 11.5–15.5)
WBC: 9.5 10*3/uL (ref 4.0–10.5)

## 2014-07-28 LAB — BASIC METABOLIC PANEL
Anion gap: 12 (ref 5–15)
BUN: 17 mg/dL (ref 6–23)
CO2: 27 mmol/L (ref 19–32)
Calcium: 9.2 mg/dL (ref 8.4–10.5)
Chloride: 100 mEq/L (ref 96–112)
Creatinine, Ser: 0.97 mg/dL (ref 0.50–1.10)
GFR calc Af Amer: 67 mL/min — ABNORMAL LOW (ref >90)
GFR calc non Af Amer: 58 mL/min — ABNORMAL LOW (ref >90)
Glucose, Bld: 156 mg/dL — ABNORMAL HIGH (ref 70–99)
Potassium: 3.7 mmol/L (ref 3.5–5.1)
Sodium: 139 mmol/L (ref 135–145)

## 2014-07-28 LAB — TYPE AND SCREEN
ABO/RH(D): O POS
Antibody Screen: NEGATIVE
Unit division: 0
Unit division: 0

## 2014-07-28 LAB — GLUCOSE, CAPILLARY
Glucose-Capillary: 138 mg/dL — ABNORMAL HIGH (ref 70–99)
Glucose-Capillary: 141 mg/dL — ABNORMAL HIGH (ref 70–99)
Glucose-Capillary: 159 mg/dL — ABNORMAL HIGH (ref 70–99)
Glucose-Capillary: 167 mg/dL — ABNORMAL HIGH (ref 70–99)
Glucose-Capillary: 169 mg/dL — ABNORMAL HIGH (ref 70–99)
Glucose-Capillary: 263 mg/dL — ABNORMAL HIGH (ref 70–99)

## 2014-07-28 LAB — MAGNESIUM: Magnesium: 1.7 mg/dL (ref 1.5–2.5)

## 2014-07-28 SURGERY — COLONOSCOPY
Anesthesia: General

## 2014-07-28 SURGERY — COLONOSCOPY
Anesthesia: Moderate Sedation

## 2014-07-28 MED ORDER — SODIUM CHLORIDE 0.9 % IV SOLN
INTRAVENOUS | Status: DC
  Administered 2014-07-28: 10:00:00 via INTRAVENOUS

## 2014-07-28 MED ORDER — DILTIAZEM HCL ER COATED BEADS 120 MG PO CP24
120.0000 mg | ORAL_CAPSULE | Freq: Every day | ORAL | Status: DC
  Administered 2014-07-28 – 2014-07-29 (×2): 120 mg via ORAL
  Filled 2014-07-28 (×2): qty 1

## 2014-07-28 MED ORDER — FENTANYL CITRATE 0.05 MG/ML IJ SOLN
INTRAMUSCULAR | Status: DC | PRN
  Administered 2014-07-28: 75 ug via INTRAVENOUS

## 2014-07-28 MED ORDER — LIDOCAINE HCL 4 % MT SOLN
OROMUCOSAL | Status: DC | PRN
  Administered 2014-07-28: 4 mL via TOPICAL

## 2014-07-28 MED ORDER — INSULIN ASPART 100 UNIT/ML ~~LOC~~ SOLN: 0.0000 [IU] | Freq: Every day | SUBCUTANEOUS | Status: DC

## 2014-07-28 MED ORDER — LACTATED RINGERS IV SOLN
INTRAVENOUS | Status: DC
  Administered 2014-07-28: 1000 mL via INTRAVENOUS

## 2014-07-28 MED ORDER — ONDANSETRON HCL 4 MG/2ML IJ SOLN
INTRAMUSCULAR | Status: DC | PRN
  Administered 2014-07-28: 4 mg via INTRAVENOUS

## 2014-07-28 MED ORDER — HYDROMORPHONE HCL 1 MG/ML IJ SOLN: 0.2500 mg | INTRAMUSCULAR | Status: DC | PRN

## 2014-07-28 MED ORDER — ONDANSETRON HCL 4 MG/2ML IJ SOLN: 4.0000 mg | Freq: Once | INTRAMUSCULAR | Status: DC | PRN

## 2014-07-28 MED ORDER — SPOT INK MARKER SYRINGE KIT
PACK | SUBMUCOSAL | Status: DC | PRN
  Administered 2014-07-28: 5 mL via SUBMUCOSAL

## 2014-07-28 MED ORDER — SODIUM CHLORIDE 0.9 % IV SOLN: INTRAVENOUS | Status: DC

## 2014-07-28 MED ORDER — LACTATED RINGERS IV SOLN
INTRAVENOUS | Status: DC | PRN
  Administered 2014-07-28: 12:00:00 via INTRAVENOUS

## 2014-07-28 MED ORDER — METOCLOPRAMIDE HCL 5 MG/ML IJ SOLN
10.0000 mg | Freq: Once | INTRAMUSCULAR | Status: AC
  Administered 2014-07-28: 10 mg via INTRAVENOUS
  Filled 2014-07-28: qty 2

## 2014-07-28 MED ORDER — PROPOFOL 10 MG/ML IV BOLUS
INTRAVENOUS | Status: DC | PRN
  Administered 2014-07-28: 150 mg via INTRAVENOUS

## 2014-07-28 MED ORDER — GLYCOPYRROLATE 0.2 MG/ML IJ SOLN
INTRAMUSCULAR | Status: DC | PRN
  Administered 2014-07-28: 0.2 mg via INTRAVENOUS

## 2014-07-28 MED ORDER — LIDOCAINE HCL (CARDIAC) 20 MG/ML IV SOLN
INTRAVENOUS | Status: DC | PRN
  Administered 2014-07-28: 50 mg via INTRAVENOUS

## 2014-07-28 MED ORDER — INSULIN ASPART 100 UNIT/ML ~~LOC~~ SOLN
0.0000 [IU] | Freq: Three times a day (TID) | SUBCUTANEOUS | Status: DC
  Administered 2014-07-29: 2 [IU] via SUBCUTANEOUS

## 2014-07-28 NOTE — Progress Notes (Signed)
Patent is a lot more successful of drinking Moviprep with the gatorade, but has about 240 cc left to drink from the first liter. Patient states is beginning to get the feeling again where the fluid is not going down and is feeling really full. Hopefully patient is able to start on the second liter before  9am and or hopefully if the second liter is not consumed, they will still perform the colonoscopy as planned. The patient is trying to drink the prep as best as she can and does not want the colonoscopy canceled. Will check back with patient and continue to monitor patient to end of shift.

## 2014-07-28 NOTE — Progress Notes (Signed)
Patient notified tech and nurse that she could not finish the Moviprep for it was makining her feel very nauseated. Notified on-call Gastroenterologist- Dr.Jacobs. Was instructed to encourage patient to drink the Moviprep otherwise the colonoscopy procedure may not be able to be performed. Dr. Ardis Hughs suggested NG tube if patient not able drink but patient refused. Consulted with pharmacist if Moviprep was able to be mixed with anything to help the taste and they suggested gatorade. Patient disagreed at first but changed her mind and stated would only drink the "blue" (berry) gatorade. Nurse did keep in mind of the color to not interfere with s/s of blood during surgery, but because of it being critical of getting the prep in the patient, at this point it is whatever the patient can drink to get it down. Will continue to monitor patient to end of shift.   PLEASE MAKE NOTE PATIENT HAD BLUE GATORADE MIXED WITH THE MOVIPREP.

## 2014-07-28 NOTE — Transfer of Care (Signed)
Immediate Anesthesia Transfer of Care Note  Patient: Alison Bell  Procedure(s) Performed: Procedure(s): COLONOSCOPY (N/A)  Patient Location: Endoscopy Unit  Anesthesia Type:General  Level of Consciousness: awake, alert  and oriented  Airway & Oxygen Therapy: Patient Spontanous Breathing and Patient connected to face mask oxygen  Post-op Assessment: Report given to PACU RN  Post vital signs: Reviewed and stable  Complications: No apparent anesthesia complications

## 2014-07-28 NOTE — Progress Notes (Signed)
Daily Rounding Note  07/28/2014, 8:41 AM  LOS: 2 days   SUBJECTIVE:      Stools bloody/brown, not clear.  Has not finished the prep  OBJECTIVE:         Vital signs in last 24 hours:    Temp:  [97.1 F (36.2 C)-98.4 F (36.9 C)] 97.1 F (36.2 C) (01/08 0506) Pulse Rate:  [63-72] 65 (01/08 0506) Resp:  [18] 18 (01/08 0506) BP: (121-150)/(31-52) 150/50 mmHg (01/08 0506) SpO2:  [95 %-97 %] 95 % (01/08 0506) Weight:  [180 lb 3.2 oz (81.738 kg)] 180 lb 3.2 oz (81.738 kg) (01/08 0506) Last BM Date: 07/26/14 Filed Weights   07/26/14 1636 07/27/14 0652 07/28/14 0506  Weight: 184 lb 8 oz (83.689 kg) 180 lb 12.8 oz (82.01 kg) 180 lb 3.2 oz (81.738 kg)   General: looks tired   No resp distress.  Did not reexamine.   Intake/Output from previous day: 01/07 0701 - 01/08 0700 In: 2743 [P.O.:2740; I.V.:3] Out: 3700 [Urine:3150; Stool:550]  Intake/Output this shift: Total I/O In: 240 [P.O.:240] Out: 700 [Stool:700]  Lab Results:  Recent Labs  07/26/14 1310 07/26/14 1405 07/27/14 0931 07/28/14 0527  WBC 9.8  --  8.2 9.5  HGB 6.7* 7.5* 8.8* 8.6*  HCT 21.7* 22.0* 27.2* 26.9*  PLT 484*  --  378 428*   BMET  Recent Labs  07/26/14 1310 07/26/14 1405 07/27/14 0520 07/28/14 0527  NA 136 135 140 139  K 4.3 4.3 3.3* 3.7  CL 101 100 104 100  CO2 26  --  27 27  GLUCOSE 170* 172* 116* 156*  BUN 23 25* 19 17  CREATININE 1.01 1.00 0.94 0.97  CALCIUM 9.2  --  8.8 9.2   LFT  Recent Labs  07/26/14 1310  PROT 7.2  ALBUMIN 3.6  AST 32  ALT 25  ALKPHOS 47  BILITOT 0.1*   PT/INR  Recent Labs  07/26/14 1310  LABPROT 16.3*  INR 1.29    Studies/Results: No results found.   Scheduled Meds: . sodium chloride   Intravenous Once  . amiodarone  200 mg Oral Daily  . atorvastatin  10 mg Oral Daily  . furosemide  40 mg Oral Daily  . insulin aspart  0-15 Units Subcutaneous 6 times per day  . loratadine  10 mg  Oral Daily  . pantoprazole  40 mg Oral Daily  . predniSONE  2.5 mg Oral Daily  . sodium chloride  3 mL Intravenous Q12H  . sodium chloride  3 mL Intravenous Q12H   Continuous Infusions:  PRN Meds:.sodium chloride, acetaminophen **OR** acetaminophen, morphine injection, ondansetron **OR** ondansetron (ZOFRAN) IV, oxyCODONE, sodium chloride, zolpidem   ASSESMENT:   * Painless hematochezia. Suspect diverticular bleed.passing blood with prep.  Difficulty tolerating prep, has not completed this.   * Acute blood loss superimposed on chronic anemia. Normocytic. Improved appropriately after PRBCs x 2.  She may have an underlying issue with anemia of chronic disease.  * Chronic Eliquis, hx A fib. Evelina Bucy her last dose AM of 1/6.  Washout period is 48 hours.  * IDDM  * Rheumatoid arthritis. On weekly methotrexate injections, low dose prednisone initiated 2 days ago.      PLAN   *  Colonoscopy 12:30 today.  *  Tap water enema(s).  *  IV reglan.    Azucena Freed  07/28/2014, 8:41 AM Pager: [phone]      Attending physician's note  I have taken an interval history, reviewed the chart and examined the patient. I agree with the Advanced Practitioner's note, impression and recommendations. GI bleeding has stopped. Hb stable at 8.6. Eliquis on hold. Pt not able to adequately complete bowel prep. Tap water enemas until clear for colonoscopy and possible EGD.   Pricilla Riffle. Fuller Plan, MD [facility]

## 2014-07-28 NOTE — Anesthesia Postprocedure Evaluation (Signed)
  Anesthesia Post-op Note  Patient: Alison Bell  Procedure(s) Performed: Procedure(s): COLONOSCOPY (N/A)  Patient Location: PACU  Anesthesia Type:General  Level of Consciousness: awake, alert , oriented and patient cooperative  Airway and Oxygen Therapy: Patient Spontanous Breathing  Post-op Pain: none  Post-op Assessment: Post-op Vital signs reviewed, Patient's Cardiovascular Status Stable, Respiratory Function Stable, Patent Airway, No signs of Nausea or vomiting and Pain level controlled  Post-op Vital Signs: stable  Last Vitals:  Filed Vitals:   07/28/14 1415  BP: 139/56  Pulse: 80  Temp:   Resp: 18    Complications: No apparent anesthesia complications

## 2014-07-28 NOTE — H&P (View-Only) (Signed)
[facility] Consult: 2:31 PM 07/26/2014  LOS: 0 days    Referring Provider: Dr Wyvonnia Dusky in ED  Primary Care Physician:  [name], MD Primary Gastroenterologist:  unassigned    Reason for Consultation:  Painless hematochezia.    HPI: Alison Bell is a 69 y.o. female.  Hx IDDM rheumatoid arthritis. On Eliquis for hx Afib.  Diastolic heart failure. Non ischemic cardiomyopathy with latest EF normal in 04/2014. Anemia noted in 04/2014 with Hgb drop from 10 to 7.5, attributed to blood loss from fall related hip hematoma.  Did not require transfusion.  Hgb 8.1 06/05/2014.    Beginning yesterday morning, the patient had the first episode of hematochezia. She did not have any accompanying abdominal pain or nausea.  Later that day she had a second episode. Her third and, thus far, final episode was this morning when the stool was still bloody. She felt a little bit dizzy with standing but otherwise has not had any symptoms suggestive of orthostatic hypotension.  Her Hgb is 6.7, MCV is normal.  BUN is 25, same as in mid 04/2014.   On pelvic CT in 04/2014, minimal scattered diverticulosis noted. On remote 03/2000 ultrasound had fatty liver.  She has never undergone upper endoscopy or colonoscopy. In November 2015 stool Hemoccults were submitted to Dr. Linnell Fulling  office and these were negative for occult blood.  Patient denies ever seeing even scant amounts of blood per rectum prior to yesterday. Her appetite is good, though she has dropped about 15 pounds in the last couple of months. She has not been dieting. No dysphagia. No history of GI distress or abdominal pains.  On 07/24/13 she started prednisone 5 mg daily for treatment of symptomatic arthritis with significant pain in the hands and knees. She also uses Tylenol when  necessary for pain but is not using any nonsteroidals nor any aspirin.  Since the spring of 2015 she has been self administering weekly methotrexate injections.    Past Medical History  Diagnosis Date  . Diabetes mellitus without complication   . Persistent atrial fibrillation 08/25/2012  . HTN (hypertension) 08/25/2012  . Hyperlipidemia 08/25/2012  . Depression   . Shortness of breath   . GERD (gastroesophageal reflux disease)   . LV dysfunction, EF 45-50% due to a. fib per echo 08/27/12  08/28/2012    a. Echo (10/15):  EF 55-60%, no RWMA, Gr 2 DD, mild MR, mod LAE, normal RVF, mild RAE, mild TR (LA 44 mm)  . Atrial enlargement, left   . Torsades de pointes     due to Tikosyn 04/2014    Past Surgical History  Procedure Laterality Date  . Tubal ligation    . Tee without cardioversion N/A 09/06/2012    Procedure: TRANSESOPHAGEAL ECHOCARDIOGRAM (TEE);  Surgeon: [name], MD;  Location: [facility] ENDOSCOPY;  Service: Cardiovascular;  Laterality: N/A;  . Cardioversion N/A 09/06/2012    Procedure: CARDIOVERSION;  Surgeon: [name], MD;  Location: [facility] ENDOSCOPY;  Service: Cardiovascular;  Laterality: N/A;  . Tee without cardioversion N/A 11/01/2012  Procedure: TRANSESOPHAGEAL ECHOCARDIOGRAM (TEE);  Surgeon: [name], MD;  Location: [place];  Service: Cardiovascular;  Laterality: N/A;  . Cardioversion N/A 01/07/2013    Procedure: CARDIOVERSION;  Surgeon: [name], MD;  Location: [facility] ENDOSCOPY;  Service: Cardiovascular;  Laterality: N/A;  . Atrial fibrillation ablation  11/02/12    PVI by Dr [name]  . Cardioversion N/A 11/14/2013    Procedure: CARDIOVERSION;  Surgeon: [name], MD;  Location: [facility] ENDOSCOPY;  Service: Cardiovascular;  Laterality: N/A;  . Atrial fibrillation ablation N/A 11/02/2012    Procedure: ATRIAL FIBRILLATION ABLATION;  Surgeon: [name], MD;  Location: [facility] CATH LAB;  Service: Cardiovascular;  Laterality: N/A;    Prior to Admission medications     Medication Sig Start Date End Date Taking? Authorizing Provider  amiodarone (PACERONE) 200 MG tablet TAKE 2 TABLETS BY MOUTH TWICE DAILY 06/21/14   [name], MD  apixaban (ELIQUIS) 5 MG TABS tablet Take 1 tablet (5 mg total) by mouth 2 (two) times daily. 05/23/14   Deboraha Sprang, MD  atorvastatin (LIPITOR) 10 MG tablet Take 10 mg by mouth daily. 04/24/13   Historical Provider, MD  CARTIA XT 120 MG 24 hr capsule Take 120 mg by mouth daily. 04/28/14   Historical Provider, MD  cetirizine (ZYRTEC) 10 MG tablet Take 10 mg by mouth daily.    Historical Provider, MD  CHOLECALCIFEROL PO Take 1 tablet by mouth daily.    Historical Provider, MD  famotidine (PEPCID) 20 MG tablet Take 20 mg by mouth 2 (two) times daily.    Historical Provider, MD  folic acid (FOLVITE) 1 MG tablet Take 1 mg by mouth daily.    Historical Provider, MD  furosemide (LASIX) 40 MG tablet Take 40 mg by mouth daily.    Historical Provider, MD  HYDROcodone-acetaminophen (NORCO/VICODIN) 5-325 MG per tablet Take 1 tablet by mouth every 6 (six) hours as needed (pain).  12/26/13   Historical Provider, MD         metFORMIN (GLUCOPHAGE) 1000 MG tablet Take 1,000 mg by mouth 2 (two) times daily with a meal.    Historical Provider, MD  Methotrexate Sodium, PF, 250 MG/10ML SOLN Inject 0.8 mLs as directed every 7 (seven) days. Thursdays    Historical Provider, MD  Multiple Vitamin (MULTIVITAMIN WITH MINERALS) TABS Take 1 tablet by mouth daily.    Historical Provider, MD  NOVOLIN 70/30 RELION (70-30) 100 UNIT/ML injection Inject as directed (sliding scale) 09/13/13   Historical Provider, MD  potassium chloride SA (K-DUR,KLOR-CON) 20 MEQ tablet Take 1 tablet (20 mEq total) by mouth daily. 03/15/14   [name], MD  predniSONE (DELTASONE) 5 MG tablet Take 2.5 mg by mouth daily.  11/01/13   Historical Provider, MD  vitamin B-12 (CYANOCOBALAMIN) 1000 MCG tablet Take 1,000 mcg by mouth daily.    Historical Provider, MD  zolpidem (AMBIEN) 10 MG  tablet Take 10 mg by mouth at bedtime.  10/12/12   Historical Provider, MD    Scheduled Meds:   Infusions: . sodium chloride 1,000 mL (07/26/14 1408)   PRN Meds:    Allergies as of 07/26/2014 - Review Complete 07/26/2014  Allergen Reaction Noted  . Benicar [olmesartan] Anaphylaxis 08/25/2012  . Latex Hives 11/14/2013  . Other Itching and Swelling 08/25/2012  . Tikosyn [dofetilide] Other (See Comments) 05/06/2014    Family History  Problem Relation Age of Onset  . Heart attack Mother   . Diabetes type II Father     History  Social History  . Marital Status: Married    Spouse Name: N/A    Number of Children: N/A  . Years of Education: N/A   Occupational History  . Not on file.   Social History Main Topics  . Smoking status: Former Smoker    Quit date: 07/21/1994  . Smokeless tobacco: Never Used  . Alcohol Use: No  . Drug Use: No  . Sexual Activity: Yes   Other Topics Concern  . Not on file   Social History Narrative   Lives in [place] with spouse.  Retired          REVIEW OF SYSTEMS: Constitutional:  Since October 2015 she's probably lost about 15 pounds. This has not been due to any effort on her part she feels like she is consuming about the same amount of food as she always does. ENT:  No nose bleeds Pulm:  No cough, no DOE or respiratory distress. CV:  No palpitations, no LE edema. No chest pain. GU:  No hematuria, no frequency GI:  No dysphagia, no heartburn. Heme:  Per HPI   Transfusions:  She has never been transfused before. Neuro:  No headaches, no peripheral tingling or numbness MS:  Patient noted immediate improvement in her joint pain after starting prednisone a couple of days ago. Derm:  No itching, no rash or sores.  Endocrine:  No sweats or chills.  No polyuria or dysuria Immunization:  Reviewed. She had flu vaccination in October 2015 Travel:  None beyond local counties in last few months.    PHYSICAL EXAM: Vital signs in last  24 hours: Filed Vitals:   07/26/14 1400  BP: 131/62  Pulse: 80  Temp:   Resp: 16   Wt Readings from Last 3 Encounters:  07/26/14 181 lb (82.101 kg)  06/05/14 193 lb 9.6 oz (87.816 kg)  05/23/14 187 lb 12.8 oz (85.186 kg)   General: Pleasant, somewhat pale, non-ill appearing older white female. Head:  No signs of head trauma, no facial edema or asymmetry.  Eyes:  No scleral icterus. Positive for conjunctival pallor. EOMI. Ears:  No hearing deficit.  Nose:  No congestion or discharge. Mouth:  Dentition in good repair. Mucous membranes moist and clear. Neck:  No JVD, no TMG, no masses. Lungs:  CTA bil.  No cough, no dyspnea. Heart: RRR. No MRG. Abdomen:  Soft. NT, ND. No organomegaly. No bruits..   Rectal: Deferred. Per ED physician the stool is brown with some scant blood surrounding.   Musc/Skeltl: No joint erythema, contractures or gross deformities. Extremities:  No CCE.  Neurologic:  Oriented 3, alert. No limb weakness or asymmetric strength. No tremor. Skin:  No telangiectasia, rash or sores. Tattoos:  None Nodes:  No cervical adenopathy.   Psych:  Pleasant, cooperative, in good spirits.  Intake/Output from previous day:   Intake/Output this shift:    LAB RESULTS:  Recent Labs  07/26/14 1310 07/26/14 1405  WBC 9.8  --   HGB 6.7* 7.5*  HCT 21.7* 22.0*  PLT 484*  --    BMET Lab Results  Component Value Date   NA 135 07/26/2014   NA 140 05/06/2014   NA 136* 05/05/2014   K 4.3 07/26/2014   K 4.3 05/06/2014   K 3.6* 05/05/2014   CL 100 07/26/2014   CL 103 05/06/2014   CL 95* 05/05/2014   CO2 23 05/06/2014   CO2 21 05/05/2014   CO2 26 05/04/2014   GLUCOSE 172* 07/26/2014   GLUCOSE  172* 05/06/2014   GLUCOSE 348* 05/05/2014   BUN 25* 07/26/2014   BUN 22 05/06/2014   BUN 25* 05/05/2014   CREATININE 1.00 07/26/2014   CREATININE 0.98 05/06/2014   CREATININE 1.18* 05/05/2014   CALCIUM 9.2 05/06/2014   CALCIUM 9.4 05/05/2014   CALCIUM 9.5 05/04/2014    LFT No results for input(s): PROT, ALBUMIN, AST, ALT, ALKPHOS, BILITOT, BILIDIR, IBILI in the last 72 hours. PT/INR Lab Results  Component Value Date   INR 1.29 07/26/2014   INR 1.22 05/01/2014   INR 1.25 01/06/2013    RADIOLOGY STUDIES: No results found.  ENDOSCOPIC STUDIES: None ever.  IMPRESSION:   *  Painless hematochezia. Suspect diverticular bleed.  *  Acute blood loss superimposed on chronic anemia. Normocytic.  She may have an underlying issue with anemia of chronic disease.  *  Chronic Eliquis. Took her last dose this morning. Per discussion with cardiology the washout period is 48 hours.  *  IDDM  *  Rheumatoid arthritis.  On weekly methotrexate injections, low dose prednisone initiated 2 days ago.      PLAN:     *  Plan colonoscopy for 07/28/2013.  Added possible upper endoscopy to the procedure orders in case no clear source for bleeding is found on the colonoscopy.  Keep her on clears for now. Begin split dose bowel prep tomorrow evening.  *  Hold Eliquis.  *  The first of 2 units of packed red blood cells has begun to infuse.  Recheck CBC following completion of transfusions.   Azucena Freed  07/26/2014, 2:31 PM Pager: [phone]

## 2014-07-28 NOTE — Op Note (Signed)
[facility] [place], [id number]   COLONOSCOPY PROCEDURE REPORT  PATIENT: Alison, Bell  MR#: [id number] BIRTHDATE: Apr 10, 1946 , 59  yrs. old GENDER: female ENDOSCOPIST: Ladene Artist, MD, [address] [facility] REFERRED YY:[id number] Hospitalists PROCEDURE DATE:  07/28/2014 PROCEDURE:   Colonoscopy with biopsy and Submucosal injection, any substance First Screening Colonoscopy - Avg.  risk and is 50 yrs.  old or older - No.  Prior Negative Screening - Now for repeat screening. N/A  History of Adenoma - Now for follow-up colonoscopy & has been > or = to 3 yrs.  N/A  Polyps Removed Today? Yes. ASA CLASS:   Class III INDICATIONS:hematochezia. MEDICATIONS: Monitored anesthesia care and Per Anesthesia DESCRIPTION OF PROCEDURE:   After the risks benefits and alternatives of the procedure were thoroughly explained, informed consent was obtained.  The digital rectal exam revealed no abnormalities of the rectum.   The Pentax Ped Colon [id number] endoscope was introduced through the anus and advanced to the cecum, which was identified by both the appendix and ileocecal valve. No adverse events experienced.   The quality of the prep was good, using MoviPrep  The instrument was then slowly withdrawn as the colon was fully examined.  COLON FINDINGS: There was moderate diverticulosis noted in the sigmoid colon, descending colon, and transverse colon.   A sessile polyp measuring 35 mm in size was found in the sigmoid colon. Due to the size of the polyp and need for anticoagulation I did not attempt to removed the polyp.  Multiple biopsies were performed. Injection (tattooing) was performed at the proximal and distal margind of the polyp.   The examination was otherwise normal. Retroflexed views revealed internal Grade II hemorrhoids. The time to cecum=2 minutes 00 seconds.  Withdrawal time=13 minutes 00 seconds.  The scope was withdrawn and the procedure  completed. COMPLICATIONS: There were no immediate complications.  ENDOSCOPIC IMPRESSION: 1.   Moderate diverticulosis in the sigmoid colon, descending colon, and transverse colon 2.   Sessile polyp in the sigmoid colon; multiple biopsies performed; Injection (tattooing) performed 3.   Grade Il internal hemorrhoids  RECOMMENDATIONS: 1.  Await pathology results 2.  Hold Aspirin and all other NSAIDS 3.  Bleeding from diverilculosis or from the large polyp 4.  Surgical consult for mgmt option of segmental colectomy to remove the large polyp. Other option is serial colonoscopies for polypectomy if she accepts higher risk of bleeding given anticoagulation needs. 5.  Ok to resume Eliquis in 2 days  eSigned:  Ladene Artist, MD, [facility] 07/28/2014 1:50 PM   cc: Alden Server, MD   PATIENT NAME:  Alison, Bell MR#: [id number]

## 2014-07-28 NOTE — Progress Notes (Signed)
Tap water enema given. Patient tolerated it well.

## 2014-07-28 NOTE — Clinical Documentation Improvement (Signed)
Query #1 "Acute on Chronic Diastolic Heart Failure" documented in current record.  "Chronic Systolic and Diastolic Heart Failure" also documented.   Treated with IV Lasix 20 mg once on 07/26/14.  Please clarify the Acuity and Type of Heart Failure monitored and treated this admission.   Query #2 Atrial Fibrillation documented in current medical record.  On Eliquis, Pacerone and Cartia prior to admission.  Noted to be in sinus rhythm on admission  Please clarify the status of the patient's atrial fibrillation:  - Persistent  - Paroxsymal  - Other Type  - Unable to Clinically Determine  Thank You, Erling Conte ,RN Clinical Documentation Specialist:  [phone] [place] [facility]

## 2014-07-28 NOTE — Interval H&P Note (Signed)
History and Physical Interval Note:  07/28/2014 12:13 PM  [facility]  has presented today for surgery, with the diagnosis of Passing blood from rectum. Low blood counts.  The various methods of treatment have been discussed with the patient and family. After consideration of risks, benefits and other options for treatment, the patient has consented to  Procedure(s): COLONOSCOPY (N/A) ESOPHAGOGASTRODUODENOSCOPY (EGD) (N/A) as a surgical intervention .  The patient's history has been reviewed, patient examined, no change in status, stable for surgery.  I have reviewed the patient's chart and labs.  Questions were answered to the patient's satisfaction.     Pricilla Riffle. Fuller Plan MD

## 2014-07-28 NOTE — Progress Notes (Signed)
Patient finally had a very large dark red, liquid-like stool. Large amount of dark blood with no clots noted. Urine not able to be measured for it was mixed in the liquidy stool. Patient stated she felt a little better but not much just enough to drink the last of the first liter. Will continue to encourage patient to start on the second liter when time is due which is about now but patient states she needs a little bit more time before she start the second half of prep. Will continue to monitor patient to end of shift.

## 2014-07-28 NOTE — Progress Notes (Signed)
The patient has a sessile polyp of the sigmoid colon 19mm in size.  She also has diverticulosis.  Dr. Fuller Plan called for a consult.  Talked with Dr. Rosendo Gros this patient does not need an inpatient consult at this time.  She is no longer bleeding per notes, she is not obstructed.  No urgent inpatient surgical needs.  We would be hesitant to resume her anticoagulation.  She should follow up in the office on an elective basis.  I have scheduled her an appointment with Dr. Excell Seltzer on Thursday 08/03/14 at 10:00am, arrive by 9:30am to check in.    Coralie Keens, PA-C General Surgery [facility] [phone] 07/28/14 1431

## 2014-07-28 NOTE — Care Management Note (Unsigned)
    Page 1 of 1   07/28/2014     2:58:48 PM CARE MANAGEMENT NOTE 07/28/2014  Patient:  [facility]   Account Number:  [id number]  Date Initiated:  07/28/2014  Documentation initiated by:  Clemmie Buelna  Subjective/Objective Assessment:   Pt adm on 07/26/14 with lower GI bleed.  PTA, pt resides at home with spouse.     Action/Plan:   Will follow for dc needs as pt progresses.   Anticipated DC Date:  07/30/2014   Anticipated DC Plan:  [place]  CM consult      Choice offered to / List presented to:             Status of service:  In process, will continue to follow Medicare Important Message given?   (If response is "NO", the following Medicare IM given date fields will be blank) Date Medicare IM given:   Medicare IM given by:   Date Additional Medicare IM given:   Additional Medicare IM given by:    Discharge Disposition:    Per UR Regulation:  Reviewed for med. necessity/level of care/duration of stay  If discussed at [place] of Stay Meetings, dates discussed:    Comments:

## 2014-07-28 NOTE — Progress Notes (Signed)
PROGRESS NOTE  Deyani Hegarty Floor IWP:[id number] DOB: 1945/10/03 DOA: 07/26/2014 PCP: [name], MD  Assessment/Plan: Acute blood loss anemia -Secondary to lower GI bleed -Transfuse 2 units PRBC -No further hematochezia since admission -Monitor hemoglobin Hematochezia/LGIB -Appreciate GI follow-up -colonoscopy 07/28/2014--large 3.5 cm polyp in the sigmoid, diverticulosis -Restart apixiban 07/30/2014 per GI -Patient likely had diverticular bleed -Given large polyp and diverticulosis, the patient is at higher risk for rebleed. Dr. Fuller Plan has set up outpatient appointment with general surgery on 01/02/2015 at 9:30 AM with Dr. Excell Seltzer Persistent Atrial fibrillation -s/p ablation -Continue amiodarone home dose 200 mg daily -Dosing was verified with the patient's husband at the bedside -Presently in sinus -Discontinue Eliquis -TSH 0.336 -Check free T4 Diabetes mellitus type 2 -Discontinue metformin -Hold 70/30 insulin -05/02/2014 hemoglobin A1c 6.6 Hypertension -Blood pressure remains stable off [place] -monitor BP off anti-HTN  Chronic systolic and diastolic CHF  -Clinically compensated  -Continue home dose of furosemide po -dosing verified with pt's husband at bedside Hypokalemia -replete -check mag--1.7   Family Communication:   Husband updated at beside Disposition Plan:   Home when medically stable          Subjective: Patient denies fevers, chills, headache, chest pain, dyspnea, nausea, vomiting, diarrhea, abdominal pain, dysuria, hematuria. No further hematochezia   Objective: Filed Vitals:   07/28/14 1331 07/28/14 1340 07/28/14 1415 07/28/14 1651  BP: 209/80 191/67 139/56 126/38  Pulse: 84 78 80 83  Temp:      TempSrc:   Oral   Resp: 15 15 18    Height:      Weight:      SpO2: 100% 100% 92%     Intake/Output Summary (Last 24 hours) at 07/28/14 1731 Last data filed at 07/28/14 1618  Gross per 24 hour  Intake   3000 ml  Output    1850 ml  Net   1150 ml   Weight change: -0.363 kg (-12.8 oz) Exam:   General:  Pt is alert, follows commands appropriately, not in acute distress  HEENT: No icterus, No thrush,[place]/AT  Cardiovascular: RRR, S1/S2, no rubs, no gallops  Respiratory: CTA bilaterally, no wheezing, no crackles, no rhonchi  Abdomen: Soft/+BS, non tender, non distended, no guarding  Extremities: No edema, No lymphangitis, No petechiae, No rashes, no synovitis  Data Reviewed: Basic Metabolic Panel:  Recent Labs Lab 07/26/14 1310 07/26/14 1405 07/27/14 0520 07/28/14 0527  NA 136 135 140 139  K 4.3 4.3 3.3* 3.7  CL 101 100 104 100  CO2 26  --  27 27  GLUCOSE 170* 172* 116* 156*  BUN 23 25* 19 17  CREATININE 1.01 1.00 0.94 0.97  CALCIUM 9.2  --  8.8 9.2  MG  --   --   --  1.7   Liver Function Tests:  Recent Labs Lab 07/26/14 1310  AST 32  ALT 25  ALKPHOS 47  BILITOT 0.1*  PROT 7.2  ALBUMIN 3.6   No results for input(s): LIPASE, AMYLASE in the last 168 hours. No results for input(s): AMMONIA in the last 168 hours. CBC:  Recent Labs Lab 07/26/14 1310 07/26/14 1405 07/27/14 0931 07/28/14 0527  WBC 9.8  --  8.2 9.5  NEUTROABS 8.5*  --   --   --   HGB 6.7* 7.5* 8.8* 8.6*  HCT 21.7* 22.0* 27.2* 26.9*  MCV 81.0  --  81.2 81.5  PLT 484*  --  378 428*   Cardiac Enzymes:  No results for input(s): CKTOTAL, CKMB, CKMBINDEX, TROPONINI in the last 168 hours. BNP: Invalid input(s): POCBNP CBG:  Recent Labs Lab 07/28/14 0046 07/28/14 0412 07/28/14 0759 07/28/14 1157 07/28/14 1612  GLUCAP 138* 159* 169* 167* 263*    No results found for this or any previous visit (from the past 240 hour(s)).   Scheduled Meds: . sodium chloride   Intravenous Once  . amiodarone  200 mg Oral Daily  . atorvastatin  10 mg Oral Daily  . diltiazem  120 mg Oral Daily  . furosemide  40 mg Oral Daily  . insulin aspart  0-15 Units Subcutaneous 6 times per day  . loratadine  10 mg Oral Daily  .  pantoprazole  40 mg Oral Daily  . predniSONE  2.5 mg Oral Daily  . sodium chloride  3 mL Intravenous Q12H   Continuous Infusions:    Talyn Dessert, DO  [facility] Pager [phone]  If 7PM-7AM, please contact night-coverage [email or web address] Password [email or web address] 07/28/2014, 5:31 PM   LOS: 2 days

## 2014-07-28 NOTE — Anesthesia Preprocedure Evaluation (Addendum)
Anesthesia Evaluation  Patient identified by MRN, date of birth, ID band Patient awake    Reviewed: Allergy & Precautions, NPO status , Patient's Chart, lab work & pertinent test results  Airway        Dental   Pulmonary former smoker,          Cardiovascular hypertension, +CHF + dysrhythmias Atrial Fibrillation     Neuro/Psych Depression    GI/Hepatic GERD-  ,  Endo/Other  diabetes, Type 2, Oral Hypoglycemic Agents, Insulin Dependent  Renal/GU      Musculoskeletal   Abdominal   Peds  Hematology  (+) anemia ,   Anesthesia Other Findings   Reproductive/Obstetrics                            Anesthesia Physical Anesthesia Plan  ASA: III  Anesthesia Plan: General   Post-op Pain Management:    Induction: Intravenous  Airway Management Planned: Oral ETT  Additional Equipment:   Intra-op Plan:   Post-operative Plan: Extubation in OR  Informed Consent: I have reviewed the patients History and Physical, chart, labs and discussed the procedure including the risks, benefits and alternatives for the proposed anesthesia with the patient or authorized representative who has indicated his/her understanding and acceptance.     Plan Discussed with: CRNA, Anesthesiologist and Surgeon  Anesthesia Plan Comments:         Anesthesia Quick Evaluation

## 2014-07-29 LAB — BASIC METABOLIC PANEL
Anion gap: 5 (ref 5–15)
BUN: 14 mg/dL (ref 6–23)
CO2: 30 mmol/L (ref 19–32)
Calcium: 8.6 mg/dL (ref 8.4–10.5)
Chloride: 102 mEq/L (ref 96–112)
Creatinine, Ser: 0.94 mg/dL (ref 0.50–1.10)
GFR calc Af Amer: 70 mL/min — ABNORMAL LOW (ref >90)
GFR calc non Af Amer: 60 mL/min — ABNORMAL LOW (ref >90)
Glucose, Bld: 158 mg/dL — ABNORMAL HIGH (ref 70–99)
Potassium: 3.1 mmol/L — ABNORMAL LOW (ref 3.5–5.1)
Sodium: 137 mmol/L (ref 135–145)

## 2014-07-29 LAB — GLUCOSE, CAPILLARY
Glucose-Capillary: 147 mg/dL — ABNORMAL HIGH (ref 70–99)
Glucose-Capillary: 164 mg/dL — ABNORMAL HIGH (ref 70–99)

## 2014-07-29 LAB — CBC
HCT: 25.3 % — ABNORMAL LOW (ref 36.0–46.0)
Hemoglobin: 8 g/dL — ABNORMAL LOW (ref 12.0–15.0)
MCH: 26.4 pg (ref 26.0–34.0)
MCHC: 31.6 g/dL (ref 30.0–36.0)
MCV: 83.5 fL (ref 78.0–100.0)
Platelets: 384 10*3/uL (ref 150–400)
RBC: 3.03 MIL/uL — ABNORMAL LOW (ref 3.87–5.11)
RDW: 18.3 % — ABNORMAL HIGH (ref 11.5–15.5)
WBC: 13.1 10*3/uL — ABNORMAL HIGH (ref 4.0–10.5)

## 2014-07-29 MED ORDER — POTASSIUM CHLORIDE CRYS ER 20 MEQ PO TBCR
40.0000 meq | EXTENDED_RELEASE_TABLET | Freq: Once | ORAL | Status: AC
  Administered 2014-07-29: 40 meq via ORAL
  Filled 2014-07-29: qty 2

## 2014-07-29 NOTE — Progress Notes (Signed)
    Progress Note   Subjective  No complaints. No recurrent bleeding.    Objective  Vital signs in last 24 hours: Temp:  [98 F (36.7 C)-98.5 F (36.9 C)] 98.5 F (36.9 C) (01/09 0703) Pulse Rate:  [66-84] 66 (01/09 0703) Resp:  [14-18] 18 (01/09 0703) BP: (112-209)/(38-80) 116/43 mmHg (01/09 0703) SpO2:  [92 %-100 %] 94 % (01/09 0703) Weight:  [181 lb 3.2 oz (82.192 kg)] 181 lb 3.2 oz (82.192 kg) (01/09 0703) Last BM Date: 07/28/14   General:   Alert,  Well-developed, WF   in NAD Heart:  Regular rate and rhythm; no murmurs Abdomen:  Soft, nontender and nondistended. Normal bowel sounds, without guarding, and without rebound.   Extremities:  Without edema. Neurologic:  Alert and  oriented x4;  grossly normal neurologically. Psych:  Alert and cooperative. Normal mood and affect.  Intake/Output from previous day: 01/08 0701 - 01/09 0700 In: 2360 [P.O.:1860; I.V.:500] Out: 925 [Urine:225; Stool:700] Intake/Output this shift:    Lab Results:  Recent Labs  07/27/14 0931 07/28/14 0527 07/29/14 0343  WBC 8.2 9.5 13.1*  HGB 8.8* 8.6* 8.0*  HCT 27.2* 26.9* 25.3*  PLT 378 428* 384   BMET  Recent Labs  07/27/14 0520 07/28/14 0527 07/29/14 0343  NA 140 139 137  K 3.3* 3.7 3.1*  CL 104 100 102  CO2 27 27 30   GLUCOSE 116* 156* 158*  BUN 19 17 14   CREATININE 0.94 0.97 0.94  CALCIUM 8.8 9.2 8.6   LFT  Recent Labs  07/26/14 1310  PROT 7.2  ALBUMIN 3.6  AST 32  ALT 25  ALKPHOS 47  BILITOT 0.1*   PT/INR  Recent Labs  07/26/14 1310  LABPROT 16.3*  INR 1.29      Assessment & Plan   * Painless hematochezia. Diverticular bleed vs polyp bleed. No recurrent bleeding. Appears stable for discharge. Surgical follow up as planned. Serial colonoscopies to remove the polyp would be second choice to surgical resection.  GI signing off.   * Acute blood loss superimposed on chronic anemia. Normocytic. Improved appropriately after PRBCs x 2.  She may have an  underlying issue with anemia of chronic disease.  Hb has drifted down gradually-likely equilibrating. Follow up with PCP.   * Chronic Eliquis, hx A fib. Would leave of Eliquis until surgical follow up. If it needs to be restarted she will have to be observant for recurrent bleeding. Follow up with PCP.   * IDDM  * Rheumatoid arthritis. On weekly methotrexate injections, low dose prednisone initiated 2 days ago.    LOS: 3 days   Norberto Sorenson T. Fuller Plan MD  07/29/2014, 8:22 AM

## 2014-07-29 NOTE — Progress Notes (Signed)
Patient alert oriented, no c/o pain or shortness, V/S stable see flowsheets for recent v/s. IV d/c, discharge summary given to patient, patient verbalize understanding. Patient d/c per order.

## 2014-07-29 NOTE — Discharge Summary (Signed)
Physician Discharge Summary  Alison Bell NAT:[id number] DOB: [birth year] DOA: 07/26/2014  PCP: [name], MD  Admit date: 07/26/2014 Discharge date: 07/29/2014  Recommendations for Outpatient Follow-up:  1. Pt will need to follow up with PCP in 2 weeks post discharge 2. Please obtain BMP and CBC in one week   Discharge Diagnoses:  Acute blood loss anemia -Secondary to lower GI bleed -Transfuse 2 units PRBC -No further hematochezia since admission -Monitor hemoglobin--hemoglobin remained stable thereafter Hematochezia/LGIB -Appreciate GI follow-up -colonoscopy 07/28/2014--large 3.5 cm polyp in [place] sigmoid, diverticulosis -per GI--Dr. Fuller Plan recommendations, [place] Bell will remain off apixiban until her appointment with general surgery on 08/03/2014--if there are no plans for imminent surgery, [place] Bell will then restart on her apixiban at that time  -Until [place] time of her Gen. surgery appointment, [place] Bell will start aspirin 81 mg daily  -Bell likely had diverticular bleed -Given large polyp and diverticulosis, Al[place] Bell is at higher risk for rebleed. Dr. Fuller Plan has set up outpatient appointment with general surgery on 08/03/2014 at 9:30 AM with Dr. Excell Seltzer Persistent Atrial fibrillation -s/p ablation -Continue amiodarone home dose 200 mg daily -Dosing was verified with [place] Bell's husband at [facility] bedside -Presently in sinus -Discontinue Eliquis just discussed above  -TSH 0.336 -Check free T4 Diabetes mellitus type 2 -Discontinue metformin--restart after discharge  -Hold 70/30 insulin during [place] hospitalization --restart after discharge  -05/02/2014 hemoglobin A1c 6.6 Hypertension -Restart diltiazem home dose  Chronic systolic and diastolic CHF  -Clinically compensated  -Continue home dose of furosemide po -dosing verified with pt's husband at bedside Hypokalemia -replete -check mag--1.7  Discharge Condition: stable  Disposition: home Follow-up  Information    Follow up with Edward Jolly, MD On 08/03/2014.   Specialty:  General Surgery   Why:  For post-operation check 10:00am, arrive by 9:30am to check in and fill out paperwork.  For new Bell evaluation by General Surgery.   Contact information:   [place] [address] [phone]       Diet:heart healthy Wt Readings from Last 3 Encounters:  07/29/14 82.192 kg (181 lb 3.2 oz)  06/05/14 87.816 kg (193 lb 9.6 oz)  05/23/14 85.186 kg (187 lb 12.8 oz)    History of present illness:  69 y.o. female with a past medical history of atrial fibrillation, currently on chronic anticoagulation with Eliquis, hypertension, type 2 diabetes mellitus, presenting to [facility] emergency department with complaints of bright red blood per rectum which started on [place] day prior to admission. [place] Bell had 3 episodes of rectal bleeding was painless.  Bell denies current NSAID use however takes Prednisone 2.5 mg PO q daily.  [place] Bell was admitted and transfused 2 units PRBCs. Gastroenterology was consulted. Al[place] Bell remained hemodynamically stable. [place] Bell underwent colonoscopy which revealed large polyp in [place] sigmoid colon. [place] Bell did not have any further bleeding during hospitalization. Her apixiban was discontinued. General surgery appointment was set up for outpatient regarding her large sigmoid polyp and risk of recurrent bleeding. Bell was instructed to hold off on her apixiban until she follows up with general surgery and on 08/03/2014.   Consultants: GI--[facility]  Discharge Exam: Filed Vitals:   07/29/14 0703  BP: 116/43  Pulse: 66  Temp: 98.5 F (36.9 C)  Resp: 18   Filed Vitals:   07/28/14 1415 07/28/14 1651 07/28/14 2155 07/29/14 0703  BP: 139/56 126/38 112/57 116/43  Pulse: 80 83 73 66  Temp:   98 F (  36.7 C) 98.5 F (36.9 C)  TempSrc: Oral  Oral Oral  Resp: 18  18 18   Height:      Weight:    82.192 kg (181 lb 3.2  oz)  SpO2: 92%  94% 94%   General: A&O x 3, NAD, pleasant, cooperative Cardiovascular: RRR, no rub, no gallop, no S3 Respiratory: CTAB, no wheeze, no rhonchi Abdomen:soft, nontender, nondistended, positive bowel sounds Extremities: No edema, No lymphangitis, no petechiae  Discharge Instructions  Discharge Instructions    Diet - low sodium heart healthy    Complete by:  As directed      Increase activity slowly    Complete by:  As directed             Medication List    STOP taking these medications        ibuprofen 200 MG tablet  Commonly known as:  ADVIL,MOTRIN      TAKE these medications        amiodarone 200 MG tablet  Commonly known as:  PACERONE  Take 200 mg by mouth daily.     amitriptyline 25 MG tablet  Commonly known as:  ELAVIL  Take 25 mg by mouth at bedtime.     apixaban 5 MG Tabs tablet  Commonly known as:  ELIQUIS  Take 1 tablet (5 mg total) by mouth 2 (two) times daily.     atorvastatin 10 MG tablet  Commonly known as:  LIPITOR  Take 10 mg by mouth daily.     CARTIA XT 120 MG 24 hr capsule  Generic drug:  diltiazem  Take 120 mg by mouth daily.     cetirizine 10 MG tablet  Commonly known as:  ZYRTEC  Take 10 mg by mouth daily.     CHOLECALCIFEROL PO  Take 1 tablet by mouth daily.     famotidine 20 MG tablet  Commonly known as:  PEPCID  Take 20 mg by mouth 2 (two) times daily.     folic acid 1 MG tablet  Commonly known as:  FOLVITE  Take 1 mg by mouth daily.     furosemide 40 MG tablet  Commonly known as:  LASIX  Take 40 mg by mouth daily.     HYDROcodone-acetaminophen 5-325 MG per tablet  Commonly known as:  NORCO/VICODIN  Take 1 tablet by mouth every 6 (six) hours as needed (pain).     metFORMIN 1000 MG tablet  Commonly known as:  GLUCOPHAGE  Take 1,000 mg by mouth 2 (two) times daily with a meal.     Methotrexate Sodium (PF) 250 MG/10ML Soln  Inject 0.8 mLs as directed every 7 (seven) days. Thursdays     multivitamin with  minerals Tabs tablet  Take 1 tablet by mouth daily.     NOVOLIN 70/30 RELION (70-30) 100 UNIT/ML injection  Generic drug:  insulin NPH-regular Human  Inject 25-40 Units into [facility] skin 2 (two) times daily with a meal. Inject as directed (sliding scale)     potassium chloride SA 20 MEQ tablet  Commonly known as:  K-DUR,KLOR-CON  Take 1 tablet (20 mEq total) by mouth daily.     predniSONE 5 MG tablet  Commonly known as:  DELTASONE  Take 2.5 mg by mouth daily.     sertraline 100 MG tablet  Commonly known as:  ZOLOFT  Take 100 mg by mouth daily.     sulfaSALAzine 500 MG tablet  Commonly known as:  AZULFIDINE  Take 500 mg by mouth  2 (two) times daily.     vitamin B-12 1000 MCG tablet  Commonly known as:  CYANOCOBALAMIN  Take 1,000 mcg by mouth daily.     zolpidem 10 MG tablet  Commonly known as:  AMBIEN  Take 10 mg by mouth at bedtime.         [facility] results of significant diagnostics from this hospitalization (including imaging, microbiology, ancillary and laboratory) are listed below for reference.    Significant Diagnostic Studies: No results found.   Microbiology: No results found for this or any previous visit (from [place] past 240 hour(s)).   Labs: Basic Metabolic Panel:  Recent Labs Lab 07/26/14 1310 07/26/14 1405 07/27/14 0520 07/28/14 0527 07/29/14 0343  NA 136 135 140 139 137  K 4.3 4.3 3.3* 3.7 3.1*  CL 101 100 104 100 102  CO2 26  --  27 27 30   GLUCOSE 170* 172* 116* 156* 158*  BUN 23 25* 19 17 14   CREATININE 1.01 1.00 0.94 0.97 0.94  CALCIUM 9.2  --  8.8 9.2 8.6  MG  --   --   --  1.7  --    Liver Function Tests:  Recent Labs Lab 07/26/14 1310  AST 32  ALT 25  ALKPHOS 47  BILITOT 0.1*  PROT 7.2  ALBUMIN 3.6   No results for input(s): LIPASE, AMYLASE in [facility] last 168 hours. No results for input(s): AMMONIA in [place] last 168 hours. CBC:  Recent Labs Lab 07/26/14 1310 07/26/14 1405 07/27/14 0931 07/28/14 0527 07/29/14 0343  WBC 9.8  --   8.2 9.5 13.1*  NEUTROABS 8.5*  --   --   --   --   HGB 6.7* 7.5* 8.8* 8.6* 8.0*  HCT 21.7* 22.0* 27.2* 26.9* 25.3*  MCV 81.0  --  81.2 81.5 83.5  PLT 484*  --  378 428* 384   Cardiac Enzymes: No results for input(s): CKTOTAL, CKMB, CKMBINDEX, TROPONINI in [facility] last 168 hours. BNP: Invalid input(s): POCBNP CBG:  Recent Labs Lab 07/28/14 0759 07/28/14 1157 07/28/14 1612 07/28/14 2149 07/29/14 0648  GLUCAP 169* 167* 263* 141* 147*    Time coordinating discharge:  Greater than 30 minutes  Signed:  Marchelle Rinella, DO [facility] Pager: [phone] 07/29/2014, 8:48 AM

## 2014-07-31 ENCOUNTER — Encounter: Payer: Self-pay | Admitting: Gastroenterology

## 2014-07-31 ENCOUNTER — Encounter (HOSPITAL_COMMUNITY): Payer: Self-pay | Admitting: Gastroenterology

## 2014-08-03 ENCOUNTER — Other Ambulatory Visit (INDEPENDENT_AMBULATORY_CARE_PROVIDER_SITE_OTHER): Payer: Self-pay | Admitting: General Surgery

## 2014-08-03 ENCOUNTER — Ambulatory Visit (INDEPENDENT_AMBULATORY_CARE_PROVIDER_SITE_OTHER): Payer: Medicare HMO | Admitting: Nurse Practitioner

## 2014-08-03 ENCOUNTER — Encounter: Payer: Self-pay | Admitting: Nurse Practitioner

## 2014-08-03 VITALS — BP 128/56 | HR 68 | Ht 62.0 in | Wt 182.0 lb

## 2014-08-03 DIAGNOSIS — Z01818 Encounter for other preprocedural examination: Secondary | ICD-10-CM | POA: Diagnosis not present

## 2014-08-03 NOTE — Progress Notes (Signed)
PCP:  [name], MD Referring physician: Dr. [name]  The patient presents today for cardiac evaluation with pending surgery.   She was hospitalized 10/12 for fall and rt hip  pain , presented in AFIB ith RVR.  She was noted to be volume overloaded and given a dose of IV Lasix. HR was better controlled on IV Diltiazem. CEs remained negative. Marland Kitchen She was seen by EP (Dr. [name]) and the decision was made to initiate Tikosyn for rhythm control. However, she developed runs of polymorphic VTach/Torsades.  Tikosyn was d/ced after one dose.   Had a drop in H&H in hospital thought to be secondary to fall and trauma to hip.  11/3,  she called to clinic c/o return to afib with rvr,dizziness. She was advised to go back on Cardizem CD 120 mg a day and was seen with Dr. [name] and loaded on amiodarone. She reports she converted 2 days later and has been maintaining NSR ever since. Currently on 200 mg day.   H/o progressive anemia and underwent an colonoscopy 07/28/14 which revealed tubulovillous adenoma which was too large to snip out. She is here today for cardiac clearance for pending laparoscopic sigmoid colectomy on 1/19. Has been off of Eliquis   prior to colonoscopy and stayed off  with pending surgery.  She had an echo in October with normal EF 55-60%, no abnormal wall motion, grade 1 diastolic dysfunction.Reports stress test couple years ago which was normal. No known CAD. Denies any exertional chest pain, dyspnea. No PND, orthopnea. Energy much better since receiving recent transfusion. Denies any tachyarrhythmias, dizziness.  Past Medical History  Diagnosis Date  . Diabetes mellitus without complication   . Persistent atrial fibrillation 08/25/2012  . HTN (hypertension) 08/25/2012  . Hyperlipidemia 08/25/2012  . Depression   . Shortness of breath   . GERD (gastroesophageal reflux disease)   . LV dysfunction, EF 45-50% due to a. fib per echo 08/27/12  08/28/2012    a. Echo (10/15):  EF  55-60%, no RWMA, Gr 2 DD, mild MR, mod LAE, normal RVF, mild RAE, mild TR (LA 44 mm)  . Atrial enlargement, left   . Torsades de pointes     due to Tikosyn 04/2014   Past Surgical History  Procedure Laterality Date  . Tubal ligation    . Tee without cardioversion N/A 09/06/2012    Procedure: TRANSESOPHAGEAL ECHOCARDIOGRAM (TEE);  Surgeon: [name], MD;  Location: [facility] ENDOSCOPY;  Service: Cardiovascular;  Laterality: N/A;  . Cardioversion N/A 09/06/2012    Procedure: CARDIOVERSION;  Surgeon: [name], MD;  Location: [facility] ENDOSCOPY;  Service: Cardiovascular;  Laterality: N/A;  . Tee without cardioversion N/A 11/01/2012    Procedure: TRANSESOPHAGEAL ECHOCARDIOGRAM (TEE);  Surgeon: [name], MD;  Location: [facility];  Service: Cardiovascular;  Laterality: N/A;  . Cardioversion N/A 01/07/2013    Procedure: CARDIOVERSION;  Surgeon: [name], MD;  Location: [facility] ENDOSCOPY;  Service: Cardiovascular;  Laterality: N/A;  . Atrial fibrillation ablation  11/02/12    PVI by Dr [name]  . Cardioversion N/A 11/14/2013    Procedure: CARDIOVERSION;  Surgeon: [name], MD;  Location: [facility] ENDOSCOPY;  Service: Cardiovascular;  Laterality: N/A;  . Atrial fibrillation ablation N/A 11/02/2012    Procedure: ATRIAL FIBRILLATION ABLATION;  Surgeon: [name], MD;  Location: [facility] CATH LAB;  Service: Cardiovascular;  Laterality: N/A;  . Colonoscopy N/A 07/28/2014    Procedure: COLONOSCOPY;  Surgeon: Ladene Artist, MD;  Location: [facility]  ENDOSCOPY;  Service: Endoscopy;  Laterality: N/A;    Current Outpatient Prescriptions  Medication Sig Dispense Refill  . amiodarone (PACERONE) 200 MG tablet Take 200 mg by mouth daily.    Marland Kitchen amitriptyline (ELAVIL) 25 MG tablet Take 25 mg by mouth at bedtime.   3  . apixaban (ELIQUIS) 5 MG TABS tablet Take 1 tablet (5 mg total) by mouth 2 (two) times daily. 180 tablet 2  . atorvastatin (LIPITOR) 10 MG tablet Take 10 mg by mouth daily.    Marland Kitchen CARTIA XT 120 MG 24 hr  capsule Take 120 mg by mouth daily.  3  . cetirizine (ZYRTEC) 10 MG tablet Take 10 mg by mouth daily.    . CHOLECALCIFEROL PO Take 1 tablet by mouth daily.    . famotidine (PEPCID) 20 MG tablet Take 20 mg by mouth 2 (two) times daily.    . folic acid (FOLVITE) 1 MG tablet Take 1 mg by mouth daily.    . furosemide (LASIX) 40 MG tablet Take 40 mg by mouth daily.    Marland Kitchen HYDROcodone-acetaminophen (NORCO/VICODIN) 5-325 MG per tablet Take 1 tablet by mouth every 6 (six) hours as needed (pain).     . Insulin Syringe-Needle U-100 (INSULIN SYRINGE 1CC/31GX5/16") 31G X 5/16" 1 ML MISC   0  . Lancets (ACCU-CHEK MULTICLIX) lancets   11  . metFORMIN (GLUCOPHAGE) 1000 MG tablet Take 1,000 mg by mouth 2 (two) times daily with a meal.    . Methotrexate Sodium, PF, 250 MG/10ML SOLN Inject 0.8 mLs as directed every 7 (seven) days. Thursdays    . Multiple Vitamin (MULTIVITAMIN WITH MINERALS) TABS Take 1 tablet by mouth daily.    Marland Kitchen NOVOLIN 70/30 RELION (70-30) 100 UNIT/ML injection Inject 25-40 Units into the skin 2 (two) times daily with a meal. 40units in the morning and 25units in the evening    . potassium chloride SA (K-DUR,KLOR-CON) 20 MEQ tablet Take 1 tablet (20 mEq total) by mouth daily. 30 tablet 6  . predniSONE (DELTASONE) 5 MG tablet Take 5 mg by mouth daily.     . sertraline (ZOLOFT) 100 MG tablet Take 100 mg by mouth at bedtime.   1  . sulfaSALAzine (AZULFIDINE) 500 MG tablet Take 500 mg by mouth 2 (two) times daily.     . vitamin B-12 (CYANOCOBALAMIN) 1000 MCG tablet Take 1,000 mcg by mouth daily.    Marland Kitchen zolpidem (AMBIEN) 10 MG tablet Take 10 mg by mouth at bedtime.      No current facility-administered medications for this visit.    Allergies  Allergen Reactions  . Benicar [Olmesartan] Anaphylaxis  . Latex Hives  . Other Itching and Swelling    White meat chicken causes hands and feet to itch and swell.  Phyllis Ginger [Dofetilide] Other (See Comments)    Torsades after 1 dose of Tikosyn without  significant QT prolongation    History   Social History  . Marital Status: Married    Spouse Name: N/A    Number of Children: N/A  . Years of Education: N/A   Occupational History  . Not on file.   Social History Main Topics  . Smoking status: Former Smoker    Quit date: 07/21/1994  . Smokeless tobacco: Never Used  . Alcohol Use: No  . Drug Use: No  . Sexual Activity: Yes   Other Topics Concern  . Not on file   Social History Narrative   Lives in [place] with spouse.  Retired  Family History  Problem Relation Age of Onset  . Heart attack Mother   . Diabetes type II Father     ROS-  All systems are reviewed and are negative except as outlined in the HPI above  Physical Exam: Filed Vitals:   08/03/14 1509  BP: 128/56  Pulse: 68  Height: 5\' 2"  (1.575 m)  Weight: 182 lb (82.555 kg)    GEN- The patient is well appearing, alert and oriented x 3 today.   Head- normocephalic, atraumatic Eyes-  Sclera clear, conjunctiva pink Ears- hearing intact Oropharynx- clear Neck- supple, no JVP Lymph- no cervical lymphadenopathy Lungs- Clear to ausculation bilaterally, normal work of breathing Heart-Regular rhythm, no murmurs, rubs or gallops, PMI not laterally displaced GI- soft, NT, ND, + BS Extremities- no clubbing, cyanosis, or trace edema. MS- no significant deformity or atrophy Skin- no rash or lesion Psych- euthymic mood, full affect Neuro- strength and sensation are intact  Review of EKG from 1/7 shows NSR, RBBB, pr int 176 ms, QRS 131 ms, Qtc 468 ms.  Echo- 05/04/14-- Left ventricle: The cavity size was normal. Systolic function was normal. The estimated ejection fraction was in the range of 55% to 60%. Wall motion was normal; there were no regional wall motion abnormalities. Features are consistent with a pseudonormal left ventricular filling pattern, with concomitant abnormal relaxation and increased filling pressure (grade 2  diastolic dysfunction). - Aortic valve: Trileaflet; normal thickness leaflets. There was no regurgitation. - Aortic root: The aortic root was normal in size. - Mitral valve: There was mild regurgitation. - Left atrium: The atrium was moderately dilated. - Right ventricle: Systolic function was normal. - Right atrium: The atrium was mildly dilated. - Tricuspid valve: There was mild regurgitation. - Pulmonary arteries: Systolic pressure was within the normal range.  Assessment and Plan:   1. Pending colectomy for tubulovillous adenoma. Appears to be low risk from cardiovascular standpoint to  proceed with surgery.   2. Persistent Afib with previous intolerance to sotalol and tikosyn In NSR with amiodarone. Continue  200 mg a day as well as Cardizem  120 mg a day perioperatively to maintain SR. Resume NOAC when deemed safe from bleeding standpoint post surgery..  Pt was discussed with Dr. [name] who is in agreement with above  plan.  F/u with Dr.Allred in 3 months.    Co Sign: Ovidio Steele,NP 08/03/2014 5:47 PM

## 2014-08-03 NOTE — Patient Instructions (Signed)
Your physician wants you to follow-up in: 3 months with Dr. [name]. You will receive a reminder letter in the mail two months in advance. If you don't receive a letter, please call our office to schedule the follow-up appointment.  Your physician recommends that you continue on your current medications as directed. Please refer to the Current Medication list given to you today.

## 2014-08-04 ENCOUNTER — Encounter (HOSPITAL_COMMUNITY): Payer: Self-pay

## 2014-08-04 ENCOUNTER — Encounter (HOSPITAL_COMMUNITY)
Admission: RE | Admit: 2014-08-04 | Discharge: 2014-08-04 | Disposition: A | Discharge status: Home / Self Care | Payer: Medicare HMO | Source: Ambulatory Visit | Attending: General Surgery | Admitting: General Surgery

## 2014-08-04 DIAGNOSIS — Z01818 Encounter for other preprocedural examination: Secondary | ICD-10-CM | POA: Insufficient documentation

## 2014-08-04 HISTORY — DX: Anemia, unspecified: D64.9

## 2014-08-04 HISTORY — DX: Headache, unspecified: R51.9

## 2014-08-04 HISTORY — DX: Headache: R51

## 2014-08-04 HISTORY — DX: Unspecified osteoarthritis, unspecified site: M19.90

## 2014-08-04 HISTORY — DX: Personal history of other medical treatment: Z92.89

## 2014-08-04 HISTORY — DX: Benign neoplasm of colon, unspecified: D12.6

## 2014-08-04 HISTORY — DX: Sleep disorder, unspecified: G47.9

## 2014-08-04 LAB — BASIC METABOLIC PANEL
Anion gap: 10 (ref 5–15)
BUN: 23 mg/dL (ref 6–23)
CO2: 30 mmol/L (ref 19–32)
Calcium: 9.9 mg/dL (ref 8.4–10.5)
Chloride: 100 mEq/L (ref 96–112)
Creatinine, Ser: 1 mg/dL (ref 0.50–1.10)
GFR calc Af Amer: 65 mL/min — ABNORMAL LOW (ref >90)
GFR calc non Af Amer: 56 mL/min — ABNORMAL LOW (ref >90)
Glucose, Bld: 83 mg/dL (ref 70–99)
Potassium: 5 mmol/L (ref 3.5–5.1)
Sodium: 140 mmol/L (ref 135–145)

## 2014-08-04 LAB — CBC
HCT: 28.9 % — ABNORMAL LOW (ref 36.0–46.0)
Hemoglobin: 9 g/dL — ABNORMAL LOW (ref 12.0–15.0)
MCH: 27.1 pg (ref 26.0–34.0)
MCHC: 31.1 g/dL (ref 30.0–36.0)
MCV: 87 fL (ref 78.0–100.0)
Platelets: 530 10*3/uL — ABNORMAL HIGH (ref 150–400)
RBC: 3.32 MIL/uL — ABNORMAL LOW (ref 3.87–5.11)
RDW: 18.9 % — ABNORMAL HIGH (ref 11.5–15.5)
WBC: 8.8 10*3/uL (ref 4.0–10.5)

## 2014-08-04 LAB — HEMOGLOBIN A1C
Hgb A1c MFr Bld: 5.6 % (ref <5.7)
Mean Plasma Glucose: 114 mg/dL (ref <117)

## 2014-08-04 LAB — PROTIME-INR
INR: 1.04 (ref 0.00–1.49)
Prothrombin Time: 13.7 seconds (ref 11.6–15.2)

## 2014-08-04 LAB — APTT: aPTT: 26 seconds (ref 24–37)

## 2014-08-04 NOTE — Patient Instructions (Addendum)
Alison Bell  08/04/2014   Your procedure is scheduled on: 08/08/14   Report to [facility] Main  Entrance and follow signs to               [place] at 12:00 PM.   Call this number if you have problems the morning of surgery [phone]   Remember:  Do not eat food or drink liquids :After Midnight.            MAY HAVE CLEAR LIQUIDS UNTIL8:00 AM     CLEAR LIQUID DIET   Foods Allowed                                                                     Foods Excluded  Coffee and tea, regular and decaf                             liquids that you cannot  Plain Jell-O in any flavor                                             see through such as: Fruit ices (not with fruit pulp)                                     milk, soups, orange juice  Iced Popsicles                                    All solid food Carbonated beverages, regular and diet                                    Cranberry, grape and apple juices Sports drinks like Gatorade Lightly seasoned clear broth or consume(fat free) Sugar, honey syrup   _____________________________________________________________________       Take these medicines the morning of surgery with A SIP OF WATER: AMIODARONE / CARTIA / CETRIZINE / FAMOTIDINE / PREDNISONE / SULFASALAZINE / TAKE 1/2 DOSE OF INSULIN THE NIGHT BEFORE SURGERY / MAY TAKE HYDROCODONE IF NEEDED                STOP ASPIRIN AND HERBAL MEDS 5 DAYS BEFORE SURGERY              FOLLOW BOWEL PREP INSTRUCTIONS                               You may not have any metal on your body including hair pins and              piercings  Do not wear jewelry, make-up, lotions, powders or perfumes.             Do not wear nail polish.  Do not shave  48 hours prior to surgery.              Men may shave face and neck.   Do not bring valuables to the hospital. [place].  Contacts, dentures or bridgework may  not be worn into surgery.  Leave suitcase in the car. After surgery it may be brought to your room.     Patients discharged the day of surgery will not be allowed to drive home.  Name and phone number of your driver:  Special Instructions: N/A              Please read over the following fact sheets you were given: _____________________________________________________________________                                                     Sisters  Before surgery, you can play an important role.  Because skin is not sterile, your skin needs to be as free of germs as possible.  You can reduce the number of germs on your skin by washing with CHG (chlorahexidine gluconate) soap before surgery.  CHG is an antiseptic cleaner which kills germs and bonds with the skin to continue killing germs even after washing. Please DO NOT use if you have an allergy to CHG or antibacterial soaps.  If your skin becomes reddened/irritated stop using the CHG and inform your nurse when you arrive at Short Stay. Do not shave (including legs and underarms) for at least 48 hours prior to the first CHG shower.  You may shave your face. Please follow these instructions carefully:   1.  Shower with CHG Soap the night before surgery and the  morning of Surgery.   2.  If you choose to wash your hair, wash your hair first as usual with your  normal  Shampoo.   3.  After you shampoo, rinse your hair and body thoroughly to remove the  shampoo.                                         4.  Use CHG as you would any other liquid soap.  You can apply chg directly  to the skin and wash . Gently wash with scrungie or clean wascloth    5.  Apply the CHG Soap to your body ONLY FROM THE NECK DOWN.   Do not use on open                           Wound or open sores. Avoid contact with eyes, ears mouth and genitals (private parts).                        Genitals (private parts) with your normal soap.               6.  Wash thoroughly, paying special attention to the area where your surgery  will be performed.   7.  Thoroughly rinse your body with warm water from the neck down.   8.  DO NOT shower/wash with  your normal soap after using and rinsing off  the CHG Soap .                9.  Pat yourself dry with a clean towel.             10.  Wear clean pajamas.             11.  Place clean sheets on your bed the night of your first shower and do not  sleep with pets.  Day of Surgery : Do not apply any lotions/deodorants the morning of surgery.  Please wear clean clothes to the hospital/surgery center.  FAILURE TO FOLLOW THESE INSTRUCTIONS MAY RESULT IN THE CANCELLATION OF YOUR SURGERY    PATIENT SIGNATURE_________________________________  ______________________________________________________________________

## 2014-08-04 NOTE — Progress Notes (Signed)
abnormal CBC faxed to Dr. Excell Seltzer

## 2014-08-04 NOTE — Progress Notes (Signed)
Pt was instructed on Mirilax bowel prep in office , I contacted triage nurse at office about nulytley bowel prep in epic orders and was told they no longer used Nulytley bowel prep and she would ask Dr. Excell Seltzer to remove it from orders.  Cardiac clearance in Epic notes from Dr. [name]

## 2014-08-07 ENCOUNTER — Ambulatory Visit: Payer: Medicare HMO | Admitting: Nurse Practitioner

## 2014-08-08 ENCOUNTER — Encounter (HOSPITAL_COMMUNITY): Payer: Self-pay | Admitting: *Deleted

## 2014-08-08 ENCOUNTER — Inpatient Hospital Stay (HOSPITAL_COMMUNITY): Payer: Medicare HMO | Admitting: Certified Registered Nurse Anesthetist

## 2014-08-08 ENCOUNTER — Inpatient Hospital Stay (HOSPITAL_COMMUNITY)
Admission: RE | Admit: 2014-08-08 | Discharge: 2014-08-13 | DRG: 330 | Disposition: A | Discharge status: Home / Self Care | Payer: Medicare HMO | Source: Ambulatory Visit | Attending: General Surgery | Admitting: General Surgery

## 2014-08-08 ENCOUNTER — Encounter (HOSPITAL_COMMUNITY)
Admission: RE | Disposition: A | Discharge status: Home / Self Care | Payer: Self-pay | Source: Ambulatory Visit | Attending: General Surgery

## 2014-08-08 DIAGNOSIS — Z7901 Long term (current) use of anticoagulants: Secondary | ICD-10-CM | POA: Diagnosis not present

## 2014-08-08 DIAGNOSIS — Z01812 Encounter for preprocedural laboratory examination: Secondary | ICD-10-CM | POA: Diagnosis not present

## 2014-08-08 DIAGNOSIS — Z87891 Personal history of nicotine dependence: Secondary | ICD-10-CM

## 2014-08-08 DIAGNOSIS — K219 Gastro-esophageal reflux disease without esophagitis: Secondary | ICD-10-CM | POA: Diagnosis present

## 2014-08-08 DIAGNOSIS — D649 Anemia, unspecified: Secondary | ICD-10-CM | POA: Diagnosis present

## 2014-08-08 DIAGNOSIS — I10 Essential (primary) hypertension: Secondary | ICD-10-CM | POA: Diagnosis present

## 2014-08-08 DIAGNOSIS — K921 Melena: Secondary | ICD-10-CM | POA: Diagnosis present

## 2014-08-08 DIAGNOSIS — K5790 Diverticulosis of intestine, part unspecified, without perforation or abscess without bleeding: Secondary | ICD-10-CM | POA: Diagnosis present

## 2014-08-08 DIAGNOSIS — E119 Type 2 diabetes mellitus without complications: Secondary | ICD-10-CM | POA: Diagnosis present

## 2014-08-08 DIAGNOSIS — D125 Benign neoplasm of sigmoid colon: Principal | ICD-10-CM | POA: Diagnosis present

## 2014-08-08 DIAGNOSIS — I482 Chronic atrial fibrillation: Secondary | ICD-10-CM | POA: Diagnosis present

## 2014-08-08 DIAGNOSIS — K635 Polyp of colon: Secondary | ICD-10-CM | POA: Diagnosis present

## 2014-08-08 HISTORY — PX: LAPAROSCOPIC SIGMOID COLECTOMY: SHX5928

## 2014-08-08 LAB — GLUCOSE, CAPILLARY
Glucose-Capillary: 71 mg/dL (ref 70–99)
Glucose-Capillary: 58 mg/dL — ABNORMAL LOW (ref 70–99)
Glucose-Capillary: 106 mg/dL — ABNORMAL HIGH (ref 70–99)
Glucose-Capillary: 155 mg/dL — ABNORMAL HIGH (ref 70–99)
Glucose-Capillary: 206 mg/dL — ABNORMAL HIGH (ref 70–99)
Glucose-Capillary: 279 mg/dL — ABNORMAL HIGH (ref 70–99)

## 2014-08-08 LAB — TYPE AND SCREEN
ABO/RH(D): O POS
Antibody Screen: NEGATIVE

## 2014-08-08 LAB — ABO/RH: ABO/RH(D): O POS

## 2014-08-08 SURGERY — COLECTOMY, SIGMOID, LAPAROSCOPIC
Anesthesia: General | Site: Abdomen

## 2014-08-08 MED ORDER — PROPOFOL 10 MG/ML IV BOLUS
INTRAVENOUS | Status: DC | PRN
  Administered 2014-08-08: 160 mg via INTRAVENOUS

## 2014-08-08 MED ORDER — DEXAMETHASONE SODIUM PHOSPHATE 10 MG/ML IJ SOLN
INTRAMUSCULAR | Status: AC
  Filled 2014-08-08: qty 1

## 2014-08-08 MED ORDER — MIDAZOLAM HCL 2 MG/2ML IJ SOLN
INTRAMUSCULAR | Status: AC
  Filled 2014-08-08: qty 2

## 2014-08-08 MED ORDER — DEXTROSE 5 % IV SOLN
2.0000 g | INTRAVENOUS | Status: AC
  Administered 2014-08-08: 2 g via INTRAVENOUS
  Filled 2014-08-08: qty 2

## 2014-08-08 MED ORDER — BUPIVACAINE-EPINEPHRINE (PF) 0.25% -1:200000 IJ SOLN
INTRAMUSCULAR | Status: AC
  Filled 2014-08-08: qty 30

## 2014-08-08 MED ORDER — 0.9 % SODIUM CHLORIDE (POUR BTL) OPTIME
TOPICAL | Status: DC | PRN
  Administered 2014-08-08: 2000 mL

## 2014-08-08 MED ORDER — LACTATED RINGERS IV SOLN
INTRAVENOUS | Status: DC
  Administered 2014-08-08: 1000 mL via INTRAVENOUS
  Administered 2014-08-08 (×2): via INTRAVENOUS

## 2014-08-08 MED ORDER — CHLORHEXIDINE GLUCONATE CLOTH 2 % EX PADS: 6.0000 | MEDICATED_PAD | Freq: Once | CUTANEOUS | Status: DC

## 2014-08-08 MED ORDER — LACTATED RINGERS IR SOLN
Status: DC | PRN
  Administered 2014-08-08: 1000 mL

## 2014-08-08 MED ORDER — OXYCODONE-ACETAMINOPHEN 5-325 MG PO TABS
1.0000 | ORAL_TABLET | ORAL | Status: DC | PRN
  Administered 2014-08-09 – 2014-08-12 (×11): 2 via ORAL
  Filled 2014-08-08 (×11): qty 2

## 2014-08-08 MED ORDER — DEXTROSE 50 % IV SOLN
INTRAVENOUS | Status: AC
  Filled 2014-08-08: qty 50

## 2014-08-08 MED ORDER — ATORVASTATIN CALCIUM 10 MG PO TABS
10.0000 mg | ORAL_TABLET | Freq: Every day | ORAL | Status: DC
  Administered 2014-08-08 – 2014-08-13 (×6): 10 mg via ORAL
  Filled 2014-08-08 (×6): qty 1

## 2014-08-08 MED ORDER — SODIUM CHLORIDE 0.9 % IJ SOLN
INTRAMUSCULAR | Status: AC
  Filled 2014-08-08: qty 10

## 2014-08-08 MED ORDER — PEG 3350-KCL-NA BICARB-NACL 420 G PO SOLR
4000.0000 mL | Freq: Once | ORAL | Status: DC
  Filled 2014-08-08: qty 4000

## 2014-08-08 MED ORDER — CEFOTETAN DISODIUM-DEXTROSE 2-2.08 GM-% IV SOLR
INTRAVENOUS | Status: AC
  Filled 2014-08-08: qty 50

## 2014-08-08 MED ORDER — INSULIN ASPART 100 UNIT/ML ~~LOC~~ SOLN
0.0000 [IU] | SUBCUTANEOUS | Status: DC
  Administered 2014-08-08: 8 [IU] via SUBCUTANEOUS
  Administered 2014-08-09 (×2): 3 [IU] via SUBCUTANEOUS
  Administered 2014-08-09: 2 [IU] via SUBCUTANEOUS
  Administered 2014-08-09: 5 [IU] via SUBCUTANEOUS
  Administered 2014-08-09: 3 [IU] via SUBCUTANEOUS
  Administered 2014-08-09: 5 [IU] via SUBCUTANEOUS
  Administered 2014-08-10 (×2): 2 [IU] via SUBCUTANEOUS
  Administered 2014-08-10: 3 [IU] via SUBCUTANEOUS
  Administered 2014-08-10 (×2): 5 [IU] via SUBCUTANEOUS
  Administered 2014-08-11 (×2): 3 [IU] via SUBCUTANEOUS
  Administered 2014-08-11: 2 [IU] via SUBCUTANEOUS
  Administered 2014-08-11: 3 [IU] via SUBCUTANEOUS
  Administered 2014-08-12: 2 [IU] via SUBCUTANEOUS
  Administered 2014-08-12: 3 [IU] via SUBCUTANEOUS
  Administered 2014-08-12: 2 [IU] via SUBCUTANEOUS
  Administered 2014-08-12: 5 [IU] via SUBCUTANEOUS

## 2014-08-08 MED ORDER — ROCURONIUM BROMIDE 100 MG/10ML IV SOLN
INTRAVENOUS | Status: DC | PRN
  Administered 2014-08-08: 5 mg via INTRAVENOUS
  Administered 2014-08-08: 20 mg via INTRAVENOUS
  Administered 2014-08-08: 10 mg via INTRAVENOUS
  Administered 2014-08-08: 25 mg via INTRAVENOUS
  Administered 2014-08-08: 10 mg via INTRAVENOUS

## 2014-08-08 MED ORDER — HYDROMORPHONE HCL 1 MG/ML IJ SOLN
0.2500 mg | INTRAMUSCULAR | Status: DC | PRN
  Administered 2014-08-08: 0.5 mg via INTRAVENOUS
  Administered 2014-08-08 (×6): 0.25 mg via INTRAVENOUS

## 2014-08-08 MED ORDER — HYDROMORPHONE HCL 1 MG/ML IJ SOLN
INTRAMUSCULAR | Status: DC | PRN
  Administered 2014-08-08 (×2): 1 mg via INTRAVENOUS

## 2014-08-08 MED ORDER — LABETALOL HCL 5 MG/ML IV SOLN
5.0000 mg | INTRAVENOUS | Status: DC | PRN
  Administered 2014-08-08: 5 mg via INTRAVENOUS

## 2014-08-08 MED ORDER — LIDOCAINE HCL (CARDIAC) 20 MG/ML IV SOLN
INTRAVENOUS | Status: AC
  Filled 2014-08-08: qty 5

## 2014-08-08 MED ORDER — ERYTHROMYCIN BASE 250 MG PO TABS
1000.0000 mg | ORAL_TABLET | ORAL | Status: DC
  Filled 2014-08-08: qty 4

## 2014-08-08 MED ORDER — NEOSTIGMINE METHYLSULFATE 10 MG/10ML IV SOLN
INTRAVENOUS | Status: DC | PRN
  Administered 2014-08-08: 4 mg via INTRAVENOUS

## 2014-08-08 MED ORDER — FENTANYL CITRATE 0.05 MG/ML IJ SOLN
INTRAMUSCULAR | Status: AC
  Filled 2014-08-08: qty 5

## 2014-08-08 MED ORDER — MORPHINE SULFATE 2 MG/ML IJ SOLN
2.0000 mg | INTRAMUSCULAR | Status: DC | PRN
  Administered 2014-08-08 (×2): 2 mg via INTRAVENOUS
  Administered 2014-08-09 (×2): 4 mg via INTRAVENOUS
  Filled 2014-08-08: qty 1
  Filled 2014-08-08 (×2): qty 2
  Filled 2014-08-08: qty 1

## 2014-08-08 MED ORDER — HYDROMORPHONE HCL 2 MG/ML IJ SOLN
INTRAMUSCULAR | Status: AC
  Filled 2014-08-08: qty 1

## 2014-08-08 MED ORDER — ONDANSETRON HCL 4 MG/2ML IJ SOLN
INTRAMUSCULAR | Status: AC
  Filled 2014-08-08: qty 2

## 2014-08-08 MED ORDER — ROCURONIUM BROMIDE 100 MG/10ML IV SOLN
INTRAVENOUS | Status: AC
  Filled 2014-08-08: qty 1

## 2014-08-08 MED ORDER — PANTOPRAZOLE SODIUM 40 MG IV SOLR
40.0000 mg | INTRAVENOUS | Status: DC
  Administered 2014-08-08: 40 mg via INTRAVENOUS
  Filled 2014-08-08: qty 40

## 2014-08-08 MED ORDER — PREDNISONE 5 MG PO TABS
5.0000 mg | ORAL_TABLET | Freq: Every day | ORAL | Status: DC
  Administered 2014-08-09 – 2014-08-13 (×5): 5 mg via ORAL
  Filled 2014-08-08 (×5): qty 1

## 2014-08-08 MED ORDER — AMIODARONE HCL 200 MG PO TABS
200.0000 mg | ORAL_TABLET | Freq: Every day | ORAL | Status: DC
  Administered 2014-08-09 – 2014-08-13 (×5): 200 mg via ORAL
  Filled 2014-08-08 (×5): qty 1

## 2014-08-08 MED ORDER — FENTANYL CITRATE 0.05 MG/ML IJ SOLN
INTRAMUSCULAR | Status: DC | PRN
  Administered 2014-08-08: 100 ug via INTRAVENOUS
  Administered 2014-08-08 (×2): 50 ug via INTRAVENOUS

## 2014-08-08 MED ORDER — ALVIMOPAN 12 MG PO CAPS
12.0000 mg | ORAL_CAPSULE | Freq: Two times a day (BID) | ORAL | Status: DC
  Administered 2014-08-09 – 2014-08-13 (×9): 12 mg via ORAL
  Filled 2014-08-08 (×10): qty 1

## 2014-08-08 MED ORDER — ALVIMOPAN 12 MG PO CAPS
12.0000 mg | ORAL_CAPSULE | Freq: Once | ORAL | Status: AC
  Administered 2014-08-08: 12 mg via ORAL
  Filled 2014-08-08: qty 1

## 2014-08-08 MED ORDER — EPHEDRINE SULFATE 50 MG/ML IJ SOLN
INTRAMUSCULAR | Status: AC
  Filled 2014-08-08: qty 1

## 2014-08-08 MED ORDER — ONDANSETRON HCL 4 MG/2ML IJ SOLN
INTRAMUSCULAR | Status: DC | PRN
  Administered 2014-08-08: 4 mg via INTRAVENOUS

## 2014-08-08 MED ORDER — PROPOFOL 10 MG/ML IV BOLUS
INTRAVENOUS | Status: AC
  Filled 2014-08-08: qty 20

## 2014-08-08 MED ORDER — POTASSIUM CHLORIDE IN NACL 20-0.9 MEQ/L-% IV SOLN
INTRAVENOUS | Status: DC
  Administered 2014-08-08 – 2014-08-10 (×4): via INTRAVENOUS
  Administered 2014-08-10: 1000 mL via INTRAVENOUS
  Administered 2014-08-11: 13:00:00 via INTRAVENOUS
  Filled 2014-08-08 (×10): qty 1000

## 2014-08-08 MED ORDER — HYDROMORPHONE HCL 1 MG/ML IJ SOLN
INTRAMUSCULAR | Status: AC
  Filled 2014-08-08: qty 1

## 2014-08-08 MED ORDER — LABETALOL HCL 5 MG/ML IV SOLN
INTRAVENOUS | Status: AC
  Filled 2014-08-08: qty 4

## 2014-08-08 MED ORDER — FUROSEMIDE 40 MG PO TABS
40.0000 mg | ORAL_TABLET | Freq: Every day | ORAL | Status: DC
Start: 2014-08-09 — End: 2014-08-13
  Administered 2014-08-09 – 2014-08-13 (×5): 40 mg via ORAL
  Filled 2014-08-08 (×5): qty 1

## 2014-08-08 MED ORDER — ONDANSETRON HCL 4 MG PO TABS: 4.0000 mg | ORAL_TABLET | Freq: Four times a day (QID) | ORAL | Status: DC | PRN

## 2014-08-08 MED ORDER — SERTRALINE HCL 100 MG PO TABS
100.0000 mg | ORAL_TABLET | Freq: Every day | ORAL | Status: DC
  Administered 2014-08-08 – 2014-08-12 (×5): 100 mg via ORAL
  Filled 2014-08-08 (×6): qty 1

## 2014-08-08 MED ORDER — NEOMYCIN SULFATE 500 MG PO TABS
1000.0000 mg | ORAL_TABLET | ORAL | Status: DC
  Filled 2014-08-08: qty 2

## 2014-08-08 MED ORDER — GLYCOPYRROLATE 0.2 MG/ML IJ SOLN
INTRAMUSCULAR | Status: AC
  Filled 2014-08-08: qty 3

## 2014-08-08 MED ORDER — SUCCINYLCHOLINE CHLORIDE 20 MG/ML IJ SOLN
INTRAMUSCULAR | Status: DC | PRN
  Administered 2014-08-08: 100 mg via INTRAVENOUS

## 2014-08-08 MED ORDER — DEXTROSE 50 % IV SOLN
25.0000 mL | Freq: Once | INTRAVENOUS | Status: AC
Start: 2014-08-08 — End: 2014-08-08
  Administered 2014-08-08: 25 mL via INTRAVENOUS

## 2014-08-08 MED ORDER — GLYCOPYRROLATE 0.2 MG/ML IJ SOLN
INTRAMUSCULAR | Status: DC | PRN
  Administered 2014-08-08: 0.6 mg via INTRAVENOUS

## 2014-08-08 MED ORDER — LACTATED RINGERS IV SOLN: INTRAVENOUS | Status: DC

## 2014-08-08 MED ORDER — POTASSIUM CHLORIDE CRYS ER 20 MEQ PO TBCR
20.0000 meq | EXTENDED_RELEASE_TABLET | Freq: Every day | ORAL | Status: DC
  Administered 2014-08-09 – 2014-08-13 (×5): 20 meq via ORAL
  Filled 2014-08-08 (×5): qty 1

## 2014-08-08 MED ORDER — LIDOCAINE HCL (CARDIAC) 20 MG/ML IV SOLN
INTRAVENOUS | Status: DC | PRN
  Administered 2014-08-08: 50 mg via INTRAVENOUS

## 2014-08-08 MED ORDER — HEPARIN SODIUM (PORCINE) 5000 UNIT/ML IJ SOLN
5000.0000 [IU] | Freq: Three times a day (TID) | INTRAMUSCULAR | Status: DC
  Administered 2014-08-09 – 2014-08-13 (×13): 5000 [IU] via SUBCUTANEOUS
  Filled 2014-08-08 (×16): qty 1

## 2014-08-08 MED ORDER — MIDAZOLAM HCL 5 MG/5ML IJ SOLN
INTRAMUSCULAR | Status: DC | PRN
  Administered 2014-08-08: 2 mg via INTRAVENOUS

## 2014-08-08 MED ORDER — ONDANSETRON HCL 4 MG/2ML IJ SOLN: 4.0000 mg | Freq: Four times a day (QID) | INTRAMUSCULAR | Status: DC | PRN

## 2014-08-08 MED ORDER — DILTIAZEM HCL ER COATED BEADS 120 MG PO CP24
120.0000 mg | ORAL_CAPSULE | Freq: Every day | ORAL | Status: DC
  Administered 2014-08-09 – 2014-08-13 (×5): 120 mg via ORAL
  Filled 2014-08-08 (×5): qty 1

## 2014-08-08 MED ORDER — PANTOPRAZOLE SODIUM 40 MG IV SOLR
40.0000 mg | INTRAVENOUS | Status: DC
  Administered 2014-08-09 – 2014-08-12 (×4): 40 mg via INTRAVENOUS
  Filled 2014-08-08 (×5): qty 40

## 2014-08-08 MED ORDER — AMITRIPTYLINE HCL 25 MG PO TABS
25.0000 mg | ORAL_TABLET | Freq: Every day | ORAL | Status: DC
  Administered 2014-08-08 – 2014-08-12 (×5): 25 mg via ORAL
  Filled 2014-08-08 (×6): qty 1

## 2014-08-08 MED ORDER — DEXAMETHASONE SODIUM PHOSPHATE 10 MG/ML IJ SOLN
INTRAMUSCULAR | Status: DC | PRN
  Administered 2014-08-08: 10 mg via INTRAVENOUS

## 2014-08-08 MED ORDER — PANTOPRAZOLE SODIUM 40 MG IV SOLR: 40.0000 mg | INTRAVENOUS | Status: DC

## 2014-08-08 MED ORDER — BUPIVACAINE-EPINEPHRINE 0.25% -1:200000 IJ SOLN
INTRAMUSCULAR | Status: DC | PRN
  Administered 2014-08-08: 50 mL

## 2014-08-08 SURGICAL SUPPLY — 82 items
APPLIER CLIP 5 13 M/L LIGAMAX5 (MISCELLANEOUS)
APPLIER CLIP ROT 10 11.4 M/L (STAPLE)
BLADE HEX COATED 2.75 (ELECTRODE) ×2 IMPLANT
BLADE SURG 15 STRL LF DISP TIS (BLADE) ×1 IMPLANT
BLADE SURG 15 STRL SS (BLADE) ×1
CABLE HIGH FREQUENCY MONO STRZ (ELECTRODE) ×2 IMPLANT
CELLS DAT CNTRL 66122 CELL SVR (MISCELLANEOUS) IMPLANT
CHLORAPREP W/TINT 26ML (MISCELLANEOUS) ×2 IMPLANT
CLIP APPLIE 5 13 M/L LIGAMAX5 (MISCELLANEOUS) IMPLANT
CLIP APPLIE ROT 10 11.4 M/L (STAPLE) IMPLANT
COVER SURGICAL LIGHT HANDLE (MISCELLANEOUS) ×2 IMPLANT
CUTTER FLEX LINEAR 45M (STAPLE) ×2 IMPLANT
DERMABOND ADVANCED (GAUZE/BANDAGES/DRESSINGS) ×2
DERMABOND ADVANCED .7 DNX12 (GAUZE/BANDAGES/DRESSINGS) ×2 IMPLANT
DRAPE CAMERA CLOSED 9X96 (DRAPES) ×2 IMPLANT
DRAPE LAPAROSCOPIC ABDOMINAL (DRAPES) ×2 IMPLANT
DRAPE SURG IRRIG POUCH 19X23 (DRAPES) ×2 IMPLANT
DRSG OPSITE POSTOP 4X6 (GAUZE/BANDAGES/DRESSINGS) ×4 IMPLANT
ELECT REM PT RETURN 9FT ADLT (ELECTROSURGICAL) ×2
ELECTRODE REM PT RTRN 9FT ADLT (ELECTROSURGICAL) ×1 IMPLANT
ENDOLOOP SUT PDS II  0 18 (SUTURE)
ENDOLOOP SUT PDS II 0 18 (SUTURE) IMPLANT
GAUZE SPONGE 4X4 12PLY STRL (GAUZE/BANDAGES/DRESSINGS) ×2 IMPLANT
GLOVE BIOGEL PI IND STRL 6 (GLOVE) ×1 IMPLANT
GLOVE BIOGEL PI IND STRL 7.0 (GLOVE) ×1 IMPLANT
GLOVE BIOGEL PI IND STRL 7.5 (GLOVE) ×6 IMPLANT
GLOVE BIOGEL PI INDICATOR 6 (GLOVE) ×1
GLOVE BIOGEL PI INDICATOR 7.0 (GLOVE) ×1
GLOVE BIOGEL PI INDICATOR 7.5 (GLOVE) ×6
GLOVE SURG SS PI 6.5 STRL IVOR (GLOVE) ×6 IMPLANT
GLOVE SURG SS PI 7.5 STRL IVOR (GLOVE) ×12 IMPLANT
GOWN STRL REUS W/TWL LRG LVL3 (GOWN DISPOSABLE) ×6 IMPLANT
GOWN STRL REUS W/TWL XL LVL3 (GOWN DISPOSABLE) ×10 IMPLANT
IV LACTATED RINGERS 1000ML (IV SOLUTION) ×2 IMPLANT
LEGGING LITHOTOMY PAIR STRL (DRAPES) ×2 IMPLANT
LIGASURE IMPACT 36 18CM CVD LR (INSTRUMENTS) IMPLANT
LIQUID BAND (GAUZE/BANDAGES/DRESSINGS) IMPLANT
MARKER SKIN DUAL TIP RULER LAB (MISCELLANEOUS) ×2 IMPLANT
NS IRRIG 1000ML POUR BTL (IV SOLUTION) ×4 IMPLANT
PACK COLON (CUSTOM PROCEDURE TRAY) ×2 IMPLANT
PORT LAP GEL ALEXIS MED 5-9CM (MISCELLANEOUS) ×2 IMPLANT
RELOAD 45 VASCULAR/THIN (ENDOMECHANICALS) ×2 IMPLANT
RELOAD STAPLE TA45 3.5 REG BLU (ENDOMECHANICALS) ×2 IMPLANT
RTRCTR WOUND ALEXIS 18CM MED (MISCELLANEOUS)
SCISSORS LAP 5X35 DISP (ENDOMECHANICALS) ×2 IMPLANT
SEALER TISSUE G2 CVD JAW 35 (ENDOMECHANICALS) IMPLANT
SEALER TISSUE G2 CVD JAW 45CM (ENDOMECHANICALS)
SET IRRIG TUBING LAPAROSCOPIC (IRRIGATION / IRRIGATOR) ×2 IMPLANT
SHEARS HARMONIC ACE PLUS 36CM (ENDOMECHANICALS) ×2 IMPLANT
SLEEVE ADV FIXATION 12X100MM (TROCAR) ×2 IMPLANT
SLEEVE ADV FIXATION 5X100MM (TROCAR) ×2 IMPLANT
SOLUTION ANTI FOG 6CC (MISCELLANEOUS) ×2 IMPLANT
SPONGE LAP 18X18 X RAY DECT (DISPOSABLE) ×2 IMPLANT
STAPLER CIRC CVD 29MM 37CM (STAPLE) ×2 IMPLANT
STAPLER VISISTAT 35W (STAPLE) ×2 IMPLANT
SUCTION POOLE TIP (SUCTIONS) ×4 IMPLANT
SUT MNCRL AB 4-0 PS2 18 (SUTURE) ×4 IMPLANT
SUT PDS AB 0 CT1 36 (SUTURE) ×8 IMPLANT
SUT PDS AB 1 CT1 27 (SUTURE) IMPLANT
SUT PDS AB 1 CTX 36 (SUTURE) IMPLANT
SUT PDS AB 1 TP1 96 (SUTURE) IMPLANT
SUT SILK 2 0 (SUTURE) ×1
SUT SILK 2 0 SH CR/8 (SUTURE) ×2 IMPLANT
SUT SILK 2-0 18XBRD TIE 12 (SUTURE) ×1 IMPLANT
SUT SILK 3 0 (SUTURE) ×1
SUT SILK 3 0 SH CR/8 (SUTURE) ×2 IMPLANT
SUT SILK 3-0 18XBRD TIE 12 (SUTURE) ×1 IMPLANT
SUT VICRYL 0 UR6 27IN ABS (SUTURE) ×2 IMPLANT
SYS LAPSCP GELPORT 120MM (MISCELLANEOUS)
SYSTEM LAPSCP GELPORT 120MM (MISCELLANEOUS) IMPLANT
TOWEL OR 17X26 10 PK STRL BLUE (TOWEL DISPOSABLE) ×2 IMPLANT
TRAY FOLEY CATH 14FRSI W/METER (CATHETERS) IMPLANT
TRAY FOLEY METER SIL LF 16FR (CATHETERS) ×2 IMPLANT
TROCAR ADV FIXATION 11X100MM (TROCAR) IMPLANT
TROCAR ADV FIXATION 12X100MM (TROCAR) ×2 IMPLANT
TROCAR ADV FIXATION 5X100MM (TROCAR) ×2 IMPLANT
TROCAR BLADELESS OPT 5 75 (ENDOMECHANICALS) ×2 IMPLANT
TROCAR XCEL BLUNT TIP 100MML (ENDOMECHANICALS) ×2 IMPLANT
TROCAR XCEL NON-BLD 11X100MML (ENDOMECHANICALS) IMPLANT
TUBING CONNECTING 10 (TUBING) ×4 IMPLANT
TUBING INSUFFLATION 10FT LAP (TUBING) ×2 IMPLANT
YANKAUER SUCT BULB TIP NO VENT (SUCTIONS) ×2 IMPLANT

## 2014-08-08 NOTE — H&P (Signed)
Alison Bell 08/03/2014 10:17 AM Location: [facility] Patient #: [id number] DOB: May 17, 1946 Married / Language: English / Race: White Female  History of Present Illness Marland Kitchen T. Cleon Signorelli MD; 08/03/2014 10:40 AM) Patient words: colon polyp.  The patient is a 69 year old female who presents with a colonic mass. She is referred by Dr. Fuller Plan for recent diagnosis of a tubulovillous adenoma of the sigmoid colon. About a week ago the patient developed rectal bleeding. This was significant enough that she was hospitalized and required a 2 unit blood transfusion. The patient is chronically anticoagulated due to atrial fibrillation. During her hospitalization about 5 days ago she underwent complete colonoscopy. Findings were significant for a 35 mm sessile polyp described in the sigmoid colon. No active bleeding. The lesion was tattooed. Dr. Fuller Plan discussed with the patient that colonoscopic removal would require several colonoscopies and would be fairly high risk for bleeding and felt that surgical resection might be a better option. She has not had any further bleeding over the last several days. She has no chronic GI complaints, abdominal pain or other difficulty with bowel movements. She has been off of her anticoagulation since the episode of bleeding.   Other Problems Lars Mage Spillers, MA; 08/03/2014 10:19 AM) Arthritis Atrial Fibrillation Back Pain Diabetes Mellitus Diverticulosis Gastroesophageal Reflux Disease Hemorrhoids High blood pressure  Past Surgical History Illene Regulus, MA; 08/03/2014 10:19 AM) Colon Polyp Removal - Colonoscopy  Diagnostic Studies History Lars Mage Spill[place], MA; 08/03/2014 10:19 AM) Colonoscopy within last year Mammogram >3 years ago Pap Smear 1-5 years ago  Allergies Illene Regulus, MA; 08/03/2014 10:22 AM) Benicar *ANTIHYPERTENSIVES* Latex Exam Gloves *MEDICAL DEVICES AND SUPPLIES* Tikosyn  *ANTIARRHYTHMICS*  Medication History Lars Mage Spillers, MA; 08/03/2014 10:29 AM) Accu-Chek Multiclix Lancets Active. Amiodarone HCl (200MG  Tablet, Oral) Active. Amitriptyline HCl (25MG  Tablet, Oral) Active. Atorvastatin Calcium (10MG  Tablet, Oral) Active. Cartia XT (120MG  Capsule ER 24HR, Oral) Active. Cetirizine HCl (10MG  Tablet, Oral) Active. Famotidine (20MG  Tablet, Oral) Active. Furosemide (40MG  Tablet, Oral) Active. MetFORMIN HCl (1000MG  Tablet, Oral) Active. Potassium Chloride Crys ER (20MEQ Tablet ER, Oral) Active. PredniSONE (5MG  Tablet, Oral) Active. Sertraline HCl (100MG  Tablet, Oral) Active. SulfaSALAzine (500MG  Tablet, Oral) Active. Eliquis (5MG  Tablet, Oral) Active. Cholecalciferol Active. Folic Acid Active. Folic Acid Xtra (Oral) Active. Norco (5-325MG  Tablet, Oral) Active. Methotrexate Sodium (PF) (250MG /10ML Solution, Injection) Active. Multivitamins/Minerals (Oral) Active. NovoLIN 70/30 ReliOn ((70-30) 100UNIT/ML Suspension, Subcutaneous) Active. Vitamin B12 (100MCG Tablet, Oral) Active. Ambien (10MG  Tablet, Oral) Active. Medications Reconciled  Social History Lars Mage [place], [place]; 08/03/2014 10:19 AM) Alcohol use Remotely quit alcohol use. No drug use Tobacco use Former smoker.  Family History Lars Mage [place], [place]; 08/03/2014 10:19 AM) Alcohol Abuse Father. Breast Cancer Mother, Sister. Diabetes Mellitus Brother, Daughter.  Pregnancy / Birth History Lars Mage [place], [place]; 08/03/2014 10:19 AM) Age at menarche 45 years. Age of menopause 62-55 Gravida 4 Maternal age 49-20 Para 4 Regular periods  Review of Systems Lars Mage [place]; 08/03/2014 10:19 AM) General Present- Fatigue. Not Present- Appetite Loss, Chills, Fever, Night Sweats, Weight Gain and Weight Loss. Skin Not Present- Change in Wart/Mole, Dryness, Hives, Jaundice, New Lesions, Non-Healing Wounds, Rash and Ulcer. HEENT Present- Wears glasses/contact lenses.  Not Present- Earache, Hearing Loss, Hoarseness, Nose Bleed, Oral Ulcers, Ringing in the Ears, Seasonal Allergies, Sinus Pain, Sore Throat, Visual Disturbances and Yellow Eyes. Respiratory Present- Difficulty Breathing and Snoring. Not Present- Bloody sputum, Chronic Cough and Wheezing. Cardiovascular Present- Leg Cramps and Rapid Heart Rate. Not Present- Chest Pain, Difficulty Breathing Lying Down, Palpitations,  Shortness of Breath and Swelling of Extremities. Gastrointestinal Present- Bloody Stool. Not Present- Abdominal Pain, Bloating, Change in Bowel Habits, Chronic diarrhea, Constipation, Difficulty Swallowing, Excessive gas, Gets full quickly at meals, Hemorrhoids, Indigestion, Nausea, Rectal Pain and Vomiting. Musculoskeletal Present- Joint Pain and Muscle Weakness. Not Present- Back Pain, Joint Stiffness, Muscle Pain and Swelling of Extremities. Neurological Present- Weakness. Not Present- Decreased Memory, Fainting, Headaches, Numbness, Seizures, Tingling, Tremor and Trouble walking. Hematology Present- Excessive bleeding. Not Present- Easy Bruising, Gland problems, HIV and Persistent Infections.   Vitals (Alisha Spillers MA; 08/03/2014 10:21 AM) 08/03/2014 10:20 AM Weight: 181 lb Height: 62in Body Surface Area: 1.9 m Body Mass Index: 33.1 kg/m Pulse: 64 (Regular)  BP: 148/82 (Sitting, Left Arm, Standard)    Physical Exam Marland Kitchen T. Dareon Nunziato MD; 08/03/2014 10:54 AM) The physical exam findings are as follows: Note:General: Alert, moderately obese Caucasian female, in no distress Skin: Warm and dry without rash or infection. HEENT: No palpable masses or thyromegaly. Sclera nonicteric. Pupils equal round and reactive. Lymph nodes: No cervical, supraclavicular, or inguinal nodes palpable. Lungs: Breath sounds clear and equal. No wheezing or increased work of breathing. Cardiovascular: Regular rate and rhythm without murmer. No JVD or edema. Abdomen: Nondistended. Soft and  nontender. No masses palpable. No organomegaly. No palpable hernias. Extremities: No edema or joint swelling or deformity. No chronic venous stasis changes. Neurologic: Alert and fully oriented. Gait normal. No focal weakness. Psychiatric: Normal mood and affect. Thought content appropriate with normal judgement and insight    Assessment & Plan Marland Kitchen T. Clayborn Milnes MD; 08/03/2014 10:58 AM) TUBULOVILLOUS ADENOMA OF LARGE INTESTINE (211.3  D12.6) Impression: 35 mm tubulovillous adenoma of the sigmoid colon with recent episode of significant bleeding requiring transfusion, on anticoagulation for chronic atrial fibrillation. Biopsy showed no evidence of dysplasia. I discussed options with the patient of piecemeal colonoscopic resection versus laparoscopic colectomy. We discussed pros and cons of each. Obviously colonoscopy is less invasive but would require several procedures with possibly some increased risk of bleeding and also slight chance of encountering a malignancy that ultimately would require surgery anyway. We discussed laparoscopic sigmoid colectomy in detail including the indications for the procedure and its nature and recovery as well as risks of general anesthesia, bleeding, infection, anastomotic leak with need for ostomy and possible need for open procedure. She was given literature regarding the procedure. After our discussion she would like to proceed with laparoscopic surgical resection. She is currently off anticoagulation. We will try to get her surgery scheduled quickly, within the next week. If we cannot do this she may need to be back on her anticoagulation I will discuss this with her cardiologist. This would of course put her at risk for rebleeding from her polyp. She does see her cardiologist in 4 days and tentatively we are going to try to schedule her surgery in 5 days. She is given prescription for antibiotic and mechanical bowel prep. Current Plans  Schedule for  Surgery Laparoscopic sigmoid colectomy

## 2014-08-08 NOTE — Interval H&P Note (Signed)
History and Physical Interval Note:  08/08/2014 2:01 PM  [facility] Alison Bell  has presented today for surgery, with the diagnosis of villous adenoma sigmoid colon with bleeding  The various methods of treatment have been discussed with the patient and family. After consideration of risks, benefits and other options for treatment, the patient has consented to  Procedure(s): LAPAROSCOPIC SIGMOID COLECTOMY (N/A) as a surgical intervention .  The patient's history has been reviewed, patient examined, no change in status, stable for surgery.  I have reviewed the patient's chart and labs.  Questions were answered to the patient's satisfaction.     Lynnda Wiersma T

## 2014-08-08 NOTE — Transfer of Care (Signed)
Immediate Anesthesia Transfer of Care Note  Patient: Alison Bell  Procedure(s) Performed: Procedure(s) (LRB): LAPAROSCOPIC SIGMOID COLECTOMY /RIGID PROCTOSCOPY (N/A)  Patient Location: PACU  Anesthesia Type: General  Level of Consciousness: sedated, patient cooperative and responds to stimulation  Airway & Oxygen Therapy: Patient Spontanous Breathing and Patient connected to face mask oxgen  Post-op Assessment: Report given to PACU RN and Post -op Vital signs reviewed and stable  Post vital signs: Reviewed and stable  Complications: No apparent anesthesia complications

## 2014-08-08 NOTE — Anesthesia Preprocedure Evaluation (Signed)
Anesthesia Evaluation  Patient identified by MRN, date of birth, ID band Patient awake    Reviewed: Allergy & Precautions, H&P , NPO status , Patient's Chart, lab work & pertinent test results  Airway Mallampati: II  TM Distance: >3 FB Neck ROM: full    Dental no notable dental hx.    Pulmonary neg pulmonary ROS, shortness of breath and with exertion, former smoker,  breath sounds clear to auscultation  Pulmonary exam normal       Cardiovascular hypertension, Pt. on medications +CHF + dysrhythmias Atrial Fibrillation Rhythm:regular Rate:Normal  Diastolic heart failure. EF 55%. History T[place]ade   Neuro/Psych negative neurological ROS  negative psych ROS   GI/Hepatic negative GI ROS, Neg liver ROS, GERD-  Medicated and Controlled,  Endo/Other  diabetes, Well Controlled, Type 2, Insulin Dependent  Renal/GU negative Renal ROS  negative genitourinary   Musculoskeletal   Abdominal   Peds  Hematology negative hematology ROS (+) anemia , hgb 9   Anesthesia Other Findings   Reproductive/Obstetrics negative OB ROS                             Anesthesia Physical Anesthesia Plan  ASA: III  Anesthesia Plan: General   Post-op Pain Management:    Induction: Intravenous  Airway Management Planned: Oral ETT  Additional Equipment:   Intra-op Plan:   Post-operative Plan: Extubation in OR  Informed Consent: I have reviewed the patients History and Physical, chart, labs and discussed the procedure including the risks, benefits and alternatives for the proposed anesthesia with the patient or authorized representative who has indicated his/her understanding and acceptance.   Dental Advisory Given  Plan Discussed with: CRNA and Surgeon  Anesthesia Plan Comments:         Anesthesia Quick Evaluation

## 2014-08-08 NOTE — Brief Op Note (Signed)
08/08/2014  5:46 PM  PATIENT:  Alison Bell  69 y.o. female  PRE-OPERATIVE DIAGNOSIS:  villous adenoma sigmoid colon with bleeding  POST-OPERATIVE DIAGNOSIS:  villous adenoma sigmoid colon with bleeding  PROCEDURE:  Procedure(s): LAPAROSCOPIC SIGMOID COLECTOMY /RIGID PROCTOSCOPY (N/A)  SURGEON:  Surgeon(s) and Role:    * Alphonsa Overall, MD - Assisting    * Excell Seltzer, MD - Primary  PHYSICIAN ASSISTANT:     ANESTHESIA:   general  EBL:  Total I/O In: 2000 [I.V.:2000] Out: 150 [Urine:150]  BLOOD ADMINISTERED:none  DRAINS: none   LOCAL MEDICATIONS USED:  MARCAINE     SPECIMEN:  Source of Specimen:  Sigmoid colon  DISPOSITION OF SPECIMEN:  PATHOLOGY  COUNTS:  YES  TOURNIQUET:  * No tourniquets in log *  DICTATION: .Dragon Dictation  PLAN OF CARE: Admit to inpatient   PATIENT DISPOSITION:  PACU - hemodynamically stable.   Delay start of Pharmacological VTE agent (>24hrs) due to surgical blood loss or risk of bleeding: no

## 2014-08-08 NOTE — Anesthesia Postprocedure Evaluation (Signed)
  Anesthesia Post-op Note  Patient: Alison Bell  Procedure(s) Performed: Procedure(s) (LRB): LAPAROSCOPIC SIGMOID COLECTOMY /RIGID PROCTOSCOPY (N/A)  Patient Location: PACU  Anesthesia Type: General  Level of Consciousness: awake and alert   Airway and Oxygen Therapy: Patient Spontanous Breathing  Post-op Pain: mild  Post-op Assessment: Post-op Vital signs reviewed, Patient's Cardiovascular Status Stable, Respiratory Function Stable, Patent Airway and No signs of Nausea or vomiting  Last Vitals:  Filed Vitals:   08/08/14 1800  BP:   Pulse:   Temp: 36.7 C  Resp:     Post-op Vital Signs: stable   Complications: No apparent anesthesia complications

## 2014-08-09 ENCOUNTER — Encounter (HOSPITAL_COMMUNITY): Payer: Self-pay | Admitting: General Surgery

## 2014-08-09 LAB — GLUCOSE, CAPILLARY
Glucose-Capillary: 141 mg/dL — ABNORMAL HIGH (ref 70–99)
Glucose-Capillary: 142 mg/dL — ABNORMAL HIGH (ref 70–99)
Glucose-Capillary: 159 mg/dL — ABNORMAL HIGH (ref 70–99)
Glucose-Capillary: 176 mg/dL — ABNORMAL HIGH (ref 70–99)
Glucose-Capillary: 182 mg/dL — ABNORMAL HIGH (ref 70–99)
Glucose-Capillary: 203 mg/dL — ABNORMAL HIGH (ref 70–99)
Glucose-Capillary: 221 mg/dL — ABNORMAL HIGH (ref 70–99)

## 2014-08-09 LAB — CBC
HCT: 26.2 % — ABNORMAL LOW (ref 36.0–46.0)
Hemoglobin: 8.1 g/dL — ABNORMAL LOW (ref 12.0–15.0)
MCH: 26.6 pg (ref 26.0–34.0)
MCHC: 30.9 g/dL (ref 30.0–36.0)
MCV: 85.9 fL (ref 78.0–100.0)
Platelets: 510 10*3/uL — ABNORMAL HIGH (ref 150–400)
RBC: 3.05 MIL/uL — ABNORMAL LOW (ref 3.87–5.11)
RDW: 19 % — ABNORMAL HIGH (ref 11.5–15.5)
WBC: 17.6 10*3/uL — ABNORMAL HIGH (ref 4.0–10.5)

## 2014-08-09 LAB — BASIC METABOLIC PANEL
Anion gap: 14 (ref 5–15)
BUN: 23 mg/dL (ref 6–23)
CO2: 20 mmol/L (ref 19–32)
Calcium: 8.7 mg/dL (ref 8.4–10.5)
Chloride: 104 mEq/L (ref 96–112)
Creatinine, Ser: 1.13 mg/dL — ABNORMAL HIGH (ref 0.50–1.10)
GFR calc Af Amer: 56 mL/min — ABNORMAL LOW (ref >90)
GFR calc non Af Amer: 48 mL/min — ABNORMAL LOW (ref >90)
Glucose, Bld: 186 mg/dL — ABNORMAL HIGH (ref 70–99)
Potassium: 5.1 mmol/L (ref 3.5–5.1)
Sodium: 138 mmol/L (ref 135–145)

## 2014-08-09 MED ORDER — LORAZEPAM 2 MG/ML IJ SOLN
0.5000 mg | Freq: Once | INTRAMUSCULAR | Status: AC
Start: 2014-08-09 — End: 2014-08-09
  Administered 2014-08-09: 0.5 mg via INTRAVENOUS
  Filled 2014-08-09: qty 1

## 2014-08-09 NOTE — Op Note (Signed)
Preoperative Diagnosis: villous adenoma sigmoid colon with bleeding  Postoprative Diagnosis: villous adenoma sigmoid colon with bleeding  Procedure: Procedure(s): LAPAROSCOPIC SIGMOID COLECTOMY /RIGID PROCTOSCOPY   Surgeon: Excell Seltzer T   Assistants: Alphonsa Overall  Anesthesia:  General endotracheal anesthesia  Indications: patient is a 69 year old female with multiple medical problems on chronic anticoagulation. She presented recently with hematochezia and colonoscopy has shown a sessile 3.5 cm polyp in the sigmoid colon. After extensive discussion regarding treatment options and risks detailed elsewhere we have elected to proceed with laparoscopic sigmoid colectomy. She has remained off her anticoagulation since diagnosis.    Procedure Detail:  Following a mechanical and antibiotic bowel prep at home the patient is brought to the operating room, placed in the supine position on the operating table and general endotracheal anesthesia induced. She was carefully positioned padded in semi-lithotomy position in yellowfin stirrups. Foley catheter was placed. She received preoperative IV antibiotics. The abdomen and perineum were widely sterilely prepped and draped. Patient timeout was performed and correct procedure verified. Access was obtained with a 1-1/2 cm incision of above the umbilicus in the midline with an open Hassan technique through a mattress suture of 0 Vicryl and pneumoperitoneum established. Under direct vision a 12 mm trocar was placed in the right lower quadrant just above and medial to the iliac crest and a 5 mm trocar in the right mid abdomen. 25 mm trochars were placed in the left abdomen in similar positions. The patient was placed in steep Trendelenburg position rotated to the right and the omentum and small bowel were displaced out of the pelvis exposing the sigmoid and rectosigmoid. The tattooing was clearly evident in the distal sigmoid colon a few centimeters above the  peritoneal reflection. The rectosigmoid was elevated and the base of the mesentery exposed. The peritoneum on the medial aspect of mesentery was incised and careful blunt dissection was carried into the retroperitoneum superficial to the iliac vessels. The mesentery was dissected up out of the retroperitoneum and the ureter was identified clearly early in the dissection. The mesentery was further dissected out laterally toward the peritoneal attachments and superiorly up toward the inferior mesenteric pedicle. With the ureter clearly in view and protected the inferior mesenteric pedicle was dissected and encircled and then divided with a single firing of the Endo GIA 45 mm white load stapler. Some further mesenteric dissection was done in further dissection out toward the lateral peritoneal attachments completely mobilizing the mesentery. I then incised the peritoneum laterally and mobilized the distal left colon and sigmoid colon entering our previous dissection. The ureter was completely exposed along its length and carefully protected. The dissection was then carried distally down into the pelvis incising peritoneum on either side of the rectosigmoid and peritoneum anterior to the rectosigmoid. Using blunt and Harmonic scalpel dissection the proximal rectum was mobilized and were able to bring this up out of the pelvis and identified a area of distal rectosigmoid for transection that appeared several centimeters distal to the tattooing. This area was cleaned of pericolic fat and the mesentery dissected and the bowel encircled with the finger dissector. The rectosigmoid was then divided at this point with 2 firings of the Endo GIA 45 mm blue load stapler. The remaining mesentery of the specimen was then sequentially divided with the Harmonic scalpel working proximally until I connected with the previous mesenteric dissection completely freeing the specimen. At this point a small incision was made in the left lower  quadrant incorporating one of the 5  mm trocar sites in a muscle splitting incision used in the peritoneal cavity entered. A wound protector was placed and the end of the divided rectosigmoid was brought out with plenty of length of colon. A point for proximal resection was chosen in the upper sigmoid/distal left colon and this was cleared of mesentery and pericolic fat and the remaining mesentery divided with the Harmonic scalpel. The bowel was clamped with the pursestring device and a 2-0 Prolene pursestring suture placed and the bowel divided and the specimen removed. I examined the specimen on the back table and the polyp was well contained with good margins. The bowel was sized to 29 mm and a 29 mm Stealth circular stapler was chosen. The anvil was placed in the end of the bowel and the pursestring suture secured. The bowel was returned to the abdominal cavity and pneumoperitoneum reestablished. Dr. Lucia Gaskins went below and under laparoscopic visualization the stapler was passed transanally to the end of the staple line and the spike deployed through the midpoint of the staple line. The anvil was attached and the stapler closed excluding any extraneous tissue. There was no tension. The stapler was fired and removed. Tissue donuts were examined and both were intact. Dr. Lucia Gaskins performed rigid sigmoidoscopy and with the bowel clamp proximal to the anastomosis and tensely distended with air under saline irrigation there was no evidence of leak. The abdomen was thoroughly irrigated and hemostasis assured. Trochars were removed and all CO2 evacuated. At this point all gloves gowns and instruments were changed. The left lower quadrant wound was closed in layers with running 0 Novafil and soft tissue infiltrated with local anesthetic. The mattress suture secured at the supraumbilical incision. All skin incisions were closed with subcuticular Monocryl and Dermabond. Sponge needle and instrument counts were  correct.    Findings: As above  Estimated Blood Loss:  Minimal         Drains: none  Blood Given: none          Specimens: sigmoid colon        Complications:  * No complications entered in OR log *         Disposition: PACU - hemodynamically stable.         Condition: stable

## 2014-08-09 NOTE — Progress Notes (Signed)
[name], female   DOB: September 26, 1945, 69 y.o.   MRN: [id number] 1 Day Post-Op  Subjective: Some mid abdominal pain, Better with medications. Feels like "gas pain". Denies nausea. Foley just out. Has not yet voided. No shortness of breath or chest pain.  Objective: Vital signs in last 24 hours: Temp:  [97.6 F (36.4 C)-98.8 F (37.1 C)] 98.3 F (36.8 C) (01/20 0522) Pulse Rate:  [56-88] 80 (01/20 0522) Resp:  [12-17] 16 (01/20 0522) BP: (120-185)/(39-70) 133/53 mmHg (01/20 0522) SpO2:  [94 %-100 %] 98 % (01/20 0522) Weight:  [175 lb (79.379 kg)] 175 lb (79.379 kg) (01/19 2030) Last BM Date: 08/08/14  Intake/Output from previous day: 01/19 0701 - 01/20 0700 In: 3958.3 [P.O.:240; I.V.:3718.3] Out: 1560 [Urine:1560] Intake/Output this shift:    General appearance: alert, cooperative, no distress and morbidly obese Resp: clear to auscultation bilaterally GI: soft without apparent tenderness. Nondistended. Incision/Wound: no erythema or drainage  Lab Results:   Recent Labs  08/09/14 0535  WBC 17.6*  HGB 8.1*  HCT 26.2*  PLT 510*   BMET  Recent Labs  08/09/14 0535  NA 138  K 5.1  CL 104  CO2 20  GLUCOSE 186*  BUN 23  CREATININE 1.13*  CALCIUM 8.7   CBG (last 3)   Recent Labs  08/09/14 0002 08/09/14 0400 08/09/14 0750  GLUCAP 221* 176* 159*      Studies/Results: No results found.  Anti-infectives: Anti-infectives    Start     Dose/Rate Route Frequency Ordered Stop   08/09/14 0600  cefoTEtan (CEFOTAN) 2 g in dextrose 5 % 50 mL IVPB     2 g100 mL/hr over 30 Minutes Intravenous On call to O.R. 08/08/14 1246 08/08/14 1431   08/08/14 1246  neomycin (MYCIFRADIN) tablet 1,000 mg  Status:  Discontinued     1,000 mg Oral 3 times per day 08/08/14 1246 08/08/14 1302   08/08/14 1246  erythromycin (E-MYCIN) tablet 1,000 mg  Status:  Discontinued     1,000 mg Oral 3 times per day 08/08/14 1246 08/08/14 1302      Assessment/Plan: s/p  Procedure(s): LAPAROSCOPIC SIGMOID COLECTOMY /RIGID PROCTOSCOPY  Stable postoperatively. Anemia but not markedly changed from preop. Elevated white count but not  unexpected first postop day. Repeat CBC in a.m. Start clear liquid diet. Ambulate.   LOS: 1 day    Ingeborg Fite T 08/09/2014

## 2014-08-10 LAB — GLUCOSE, CAPILLARY
Glucose-Capillary: 104 mg/dL — ABNORMAL HIGH (ref 70–99)
Glucose-Capillary: 116 mg/dL — ABNORMAL HIGH (ref 70–99)
Glucose-Capillary: 136 mg/dL — ABNORMAL HIGH (ref 70–99)
Glucose-Capillary: 174 mg/dL — ABNORMAL HIGH (ref 70–99)
Glucose-Capillary: 201 mg/dL — ABNORMAL HIGH (ref 70–99)
Glucose-Capillary: 215 mg/dL — ABNORMAL HIGH (ref 70–99)

## 2014-08-10 LAB — BASIC METABOLIC PANEL
Anion gap: 10 (ref 5–15)
BUN: 14 mg/dL (ref 6–23)
CO2: 25 mmol/L (ref 19–32)
Calcium: 8.7 mg/dL (ref 8.4–10.5)
Chloride: 102 mEq/L (ref 96–112)
Creatinine, Ser: 0.89 mg/dL (ref 0.50–1.10)
GFR calc Af Amer: 74 mL/min — ABNORMAL LOW (ref >90)
GFR calc non Af Amer: 64 mL/min — ABNORMAL LOW (ref >90)
Glucose, Bld: 121 mg/dL — ABNORMAL HIGH (ref 70–99)
Potassium: 4.4 mmol/L (ref 3.5–5.1)
Sodium: 137 mmol/L (ref 135–145)

## 2014-08-10 LAB — CBC
HCT: 25.5 % — ABNORMAL LOW (ref 36.0–46.0)
Hemoglobin: 7.7 g/dL — ABNORMAL LOW (ref 12.0–15.0)
MCH: 26.2 pg (ref 26.0–34.0)
MCHC: 30.2 g/dL (ref 30.0–36.0)
MCV: 86.7 fL (ref 78.0–100.0)
Platelets: 479 10*3/uL — ABNORMAL HIGH (ref 150–400)
RBC: 2.94 MIL/uL — ABNORMAL LOW (ref 3.87–5.11)
RDW: 19.2 % — ABNORMAL HIGH (ref 11.5–15.5)
WBC: 12.2 10*3/uL — ABNORMAL HIGH (ref 4.0–10.5)

## 2014-08-10 MED ORDER — ZOLPIDEM TARTRATE 5 MG PO TABS
5.0000 mg | ORAL_TABLET | Freq: Every evening | ORAL | Status: DC | PRN
  Administered 2014-08-10 – 2014-08-12 (×2): 5 mg via ORAL
  Filled 2014-08-10 (×2): qty 1

## 2014-08-10 NOTE — Progress Notes (Signed)
[name], female   DOB: 1946-07-14, 69 y.o.   MRN: [id number] 2 Days Post-Op  Subjective: No major complaints. Still some midabdominal pain that feels like "gas", better than yesterday. No flatus. No nausea on clear liquids.  Objective: Vital signs in last 24 hours: Temp:  [98.2 F (36.8 C)-98.5 F (36.9 C)] 98.2 F (36.8 C) (01/21 0529) Pulse Rate:  [62-77] 77 (01/21 0529) Resp:  [16-18] 16 (01/21 0529) BP: (130-139)/(39-56) 139/55 mmHg (01/21 0529) SpO2:  [91 %-97 %] 91 % (01/21 0529) Last BM Date: 08/08/14  Intake/Output from previous day: 01/20 0701 - 01/21 0700 In: 2880 [P.O.:480; I.V.:2400] Out: 1600 [Urine:1600] Intake/Output this shift:    General appearance: alert, cooperative and no distress Resp: clear to auscultation bilaterally GI: normal findings: soft, non-tender and nondistended Incision/Wound: clean and dry without evidence of infection  Lab Results:   Recent Labs  08/09/14 0535 08/10/14 0655  WBC 17.6* 12.2*  HGB 8.1* 7.7*  HCT 26.2* 25.5*  PLT 510* 479*   BMET  Recent Labs  08/09/14 0535 08/10/14 0655  NA 138 137  K 5.1 4.4  CL 104 102  CO2 20 25  GLUCOSE 186* 121*  BUN 23 14  CREATININE 1.13* 0.89  CALCIUM 8.7 8.7   CBG (last 3)   Recent Labs  08/09/14 2327 08/10/14 0326 08/10/14 0732  GLUCAP 142* 136* 104*      Studies/Results: No results found.  Anti-infectives: Anti-infectives    Start     Dose/Rate Route Frequency Ordered Stop   08/09/14 0600  cefoTEtan (CEFOTAN) 2 g in dextrose 5 % 50 mL IVPB     2 g100 mL/hr over 30 Minutes Intravenous On call to O.R. 08/08/14 1246 08/08/14 1431   08/08/14 1246  neomycin (MYCIFRADIN) tablet 1,000 mg  Status:  Discontinued     1,000 mg Oral 3 times per day 08/08/14 1246 08/08/14 1302   08/08/14 1246  erythromycin (E-MYCIN) tablet 1,000 mg  Status:  Discontinued     1,000 mg Oral 3 times per day 08/08/14 1246 08/08/14 1302      Assessment/Plan: s/p  Procedure(s): LAPAROSCOPIC SIGMOID COLECTOMY /RIGID PROCTOSCOPY Overall doing well without complication identified. Start full liquid diet. Some decrease in hemoglobin with history of chronic anemia. Follow for now without transfusion.  Diabetes mellitus-good control on SSI   LOS: 2 days    Roch Quach T 08/10/2014

## 2014-08-11 LAB — CBC
HCT: 24.1 % — ABNORMAL LOW (ref 36.0–46.0)
Hemoglobin: 7.4 g/dL — ABNORMAL LOW (ref 12.0–15.0)
MCH: 26.6 pg (ref 26.0–34.0)
MCHC: 30.7 g/dL (ref 30.0–36.0)
MCV: 86.7 fL (ref 78.0–100.0)
Platelets: 476 K/uL — ABNORMAL HIGH (ref 150–400)
RBC: 2.78 MIL/uL — ABNORMAL LOW (ref 3.87–5.11)
RDW: 19.1 % — ABNORMAL HIGH (ref 11.5–15.5)
WBC: 10.2 K/uL (ref 4.0–10.5)

## 2014-08-11 LAB — GLUCOSE, CAPILLARY
Glucose-Capillary: 107 mg/dL — ABNORMAL HIGH (ref 70–99)
Glucose-Capillary: 139 mg/dL — ABNORMAL HIGH (ref 70–99)
Glucose-Capillary: 144 mg/dL — ABNORMAL HIGH (ref 70–99)
Glucose-Capillary: 159 mg/dL — ABNORMAL HIGH (ref 70–99)
Glucose-Capillary: 179 mg/dL — ABNORMAL HIGH (ref 70–99)
Glucose-Capillary: 185 mg/dL — ABNORMAL HIGH (ref 70–99)

## 2014-08-11 MED ORDER — OXYCODONE-ACETAMINOPHEN 5-325 MG PO TABS: 1.0000 | ORAL_TABLET | ORAL | Status: DC | PRN

## 2014-08-11 MED ORDER — ZOLPIDEM TARTRATE 10 MG PO TABS
5.0000 mg | ORAL_TABLET | Freq: Every day | ORAL | Status: DC
  Administered 2014-08-11: 5 mg via ORAL
  Filled 2014-08-11: qty 1

## 2014-08-11 NOTE — Progress Notes (Signed)
[name], female   DOB: 20-Nov-1945, 69 y.o.   MRN: [id number] 3 Days Post-Op  Subjective: No complaints this morning.  Has not yet had a bowel movement. Denies flatus. Tolerating full liquids without nausea and would like something to eat. Occasional mild gas cramping. No other pain.  Objective: Vital signs in last 24 hours: Temp:  [98.2 F (36.8 C)-98.5 F (36.9 C)] 98.2 F (36.8 C) (01/22 0513) Pulse Rate:  [68-79] 68 (01/22 0513) Resp:  [16] 16 (01/22 0513) BP: (128-146)/(48-52) 137/50 mmHg (01/22 0513) SpO2:  [94 %-97 %] 94 % (01/22 0513) Last BM Date: 08/08/14  Intake/Output from previous day: 01/21 0701 - 01/22 0700 In: 2956.3 [P.O.:1680; I.V.:1276.3] Out: 0938 [Urine:4050] Intake/Output this shift:    General appearance: alert, cooperative and no distress GI: normal findings: soft, non-tender and nondistended and bowel sounds present Incision/Wound: clean and dry without evidence of infection  Lab Results:   Recent Labs  08/10/14 0655 08/11/14 0513  WBC 12.2* 10.2  HGB 7.7* 7.4*  HCT 25.5* 24.1*  PLT 479* 476*   BMET  Recent Labs  08/09/14 0535 08/10/14 0655  NA 138 137  K 5.1 4.4  CL 104 102  CO2 20 25  GLUCOSE 186* 121*  BUN 23 14  CREATININE 1.13* 0.89  CALCIUM 8.7 8.7   CBG (last 3)   Recent Labs  08/10/14 2354 08/11/14 0413 08/11/14 0802  GLUCAP 116* 139* 107*      Studies/Results: Pathology shows a 3 cm tubulovillous adenoma without atypia and 10 negative lymph nodes  Anti-infectives: Anti-infectives    Start     Dose/Rate Route Frequency Ordered Stop   08/09/14 0600  cefoTEtan (CEFOTAN) 2 g in dextrose 5 % 50 mL IVPB     2 g100 mL/hr over 30 Minutes Intravenous On call to O.R. 08/08/14 1246 08/08/14 1431   08/08/14 1246  neomycin (MYCIFRADIN) tablet 1,000 mg  Status:  Discontinued     1,000 mg Oral 3 times per day 08/08/14 1246 08/08/14 1302   08/08/14 1246  erythromycin (E-MYCIN) tablet 1,000 mg  Status:   Discontinued     1,000 mg Oral 3 times per day 08/08/14 1246 08/08/14 1302      Assessment/Plan: s/p Procedure(s): LAPAROSCOPIC SIGMOID COLECTOMY /RIGID PROCTOSCOPY Doing well without complication. No bowel function yet but abdomen is benign. Advance diet. Diabetes mellitus, good control on SSI Postop anemia superimposed on chronic anemia. Hemoglobin stabilizing. Likely discharge next 1 or 2 days. Will resume Elaquis after discharge   LOS: 3 days    Kennedi Lizardo T 08/11/2014

## 2014-08-11 NOTE — Discharge Instructions (Signed)
[facility]      [facility] [phone]  OPEN ABDOMINAL SURGERY: POST OP INSTRUCTIONS  Always review your discharge instruction sheet given to you by the facility where your surgery was performed.  IF YOU HAVE DISABILITY OR FAMILY LEAVE FORMS, YOU MUST BRING THEM TO THE OFFICE FOR PROCESSING.  PLEASE DO NOT GIVE THEM TO YOUR DOCTOR.  1. A prescription for pain medication may be given to you upon discharge.  Take your pain medication as prescribed, if needed.  If narcotic pain medicine is not needed, then you may take acetaminophen (Tylenol) or ibuprofen (Advil) as needed. 2. Take your usually prescribed medications unless otherwise directed. 3. If you need a refill on your pain medication, please contact your pharmacy. They will contact our office to request authorization.  Prescriptions will not be filled after 5pm or on week-ends. 4. You should follow a light diet the first few days after arrival home, such as soup and crackers, pudding, etc.unless your doctor has advised otherwise. A high-fiber, low fat diet can be resumed as tolerated.   Be sure to include lots of fluids daily. Most patients will experience some swelling and bruising on the chest and neck area.  Ice packs will help.  Swelling and bruising can take several days to resolve 5. Most patients will experience some swelling and bruising in the area of the incision. Ice pack will help. Swelling and bruising can take several days to resolve..  6. It is common to experience some constipation if taking pain medication after surgery.  Increasing fluid intake and taking a stool softener will usually help or prevent this problem from occurring.  A mild laxative (Milk of Magnesia or Miralax) should be taken according to package directions if there are no bowel movements after 48 hours. 7.  You may have steri-strips (small skin tapes) in place directly over the incision.  These strips should be left on the skin for 7-10 days.  If your  surgeon used skin glue on the incision, you may shower in 24 hours.  The glue will flake off over the next 2-3 weeks.  Any sutures or staples will be removed at the office during your follow-up visit. You may find that a light gauze bandage over your incision may keep your staples from being rubbed or pulled. You may shower and replace the bandage daily. 8. ACTIVITIES:  You may resume regular (light) daily activities beginning the next day--such as daily self-care, walking, climbing stairs--gradually increasing activities as tolerated.  You may have sexual intercourse when it is comfortable.  Refrain from any heavy lifting or straining until approved by your doctor. a. You may drive when you no longer are taking prescription pain medication, you can comfortably wear a seatbelt, and you can safely maneuver your car and apply brakes b. Return to Work: ___________________________________ 9. You should see your doctor in the office for a follow-up appointment approximately two weeks after your surgery.  Make sure that you call for this appointment within a day or two after you arrive home to insure a convenient appointment time. OTHER INSTRUCTIONS:  _____________________________________________________________ _____________________________________________________________  WHEN TO CALL YOUR DOCTOR: 1. Fever over 101.0 2. Inability to urinate 3. Nausea and/or vomiting 4. Extreme swelling or bruising 5. Continued bleeding from incision. 6. Increased pain, redness, or drainage from the incision. 7. Difficulty swallowing or breathing 8. Muscle cramping or spasms. 9. Numbness or tingling in hands or feet or around lips.  The clinic staff is available to   answer your questions during regular business hours.  Please don't hesitate to call and ask to speak to one of the nurses if you have concerns.  For further questions, please visit [email or web address]   

## 2014-08-12 LAB — GLUCOSE, CAPILLARY
Glucose-Capillary: 116 mg/dL — ABNORMAL HIGH (ref 70–99)
Glucose-Capillary: 128 mg/dL — ABNORMAL HIGH (ref 70–99)
Glucose-Capillary: 224 mg/dL — ABNORMAL HIGH (ref 70–99)
Glucose-Capillary: 179 mg/dL — ABNORMAL HIGH (ref 70–99)
Glucose-Capillary: 162 mg/dL — ABNORMAL HIGH (ref 70–99)

## 2014-08-12 LAB — CBC
HCT: 26.2 % — ABNORMAL LOW (ref 36.0–46.0)
Hemoglobin: 8.1 g/dL — ABNORMAL LOW (ref 12.0–15.0)
MCH: 26.6 pg (ref 26.0–34.0)
MCHC: 30.9 g/dL (ref 30.0–36.0)
MCV: 86.2 fL (ref 78.0–100.0)
Platelets: 482 10*3/uL — ABNORMAL HIGH (ref 150–400)
RBC: 3.04 MIL/uL — ABNORMAL LOW (ref 3.87–5.11)
RDW: 19.1 % — ABNORMAL HIGH (ref 11.5–15.5)
WBC: 7.3 10*3/uL (ref 4.0–10.5)

## 2014-08-12 MED ORDER — INSULIN ASPART 100 UNIT/ML ~~LOC~~ SOLN
0.0000 [IU] | Freq: Three times a day (TID) | SUBCUTANEOUS | Status: DC
  Administered 2014-08-12: 3 [IU] via SUBCUTANEOUS
  Administered 2014-08-13: 2 [IU] via SUBCUTANEOUS

## 2014-08-12 NOTE — Progress Notes (Signed)
4 Days Post-Op  Subjective: Pt with flatus but  no BM   Objective: Vital signs in last 24 hours: Temp:  [97.9 F (36.6 C)-98.1 F (36.7 C)] 98 F (36.7 C) (01/23 0533) Pulse Rate:  [65-74] 71 (01/23 0533) Resp:  [16] 16 (01/23 0533) BP: (131-141)/(43-57) 141/50 mmHg (01/23 0533) SpO2:  [96 %-100 %] 96 % (01/23 0533) Last BM Date: 08/08/14  Intake/Output from previous day: 01/22 0701 - 01/23 0700 In: 3240 [P.O.:2040; I.V.:1200] Out: 4850 [Urine:4850] Intake/Output this shift:    General appearance: alert and cooperative Incision/Wound: soft ND incisions clean   Lab Results:   Recent Labs  08/11/14 0513 08/12/14 0503  WBC 10.2 7.3  HGB 7.4* 8.1*  HCT 24.1* 26.2*  PLT 476* 482*   BMET  Recent Labs  08/10/14 0655  NA 137  K 4.4  CL 102  CO2 25  GLUCOSE 121*  BUN 14  CREATININE 0.89  CALCIUM 8.7   PT/INR No results for input(s): LABPROT, INR in the last 72 hours. ABG No results for input(s): PHART, HCO3 in the last 72 hours.  Invalid input(s): PCO2, PO2  Studies/Results: No results found.  Anti-infectives: Anti-infectives    Start     Dose/Rate Route Frequency Ordered Stop   08/09/14 0600  cefoTEtan (CEFOTAN) 2 g in dextrose 5 % 50 mL IVPB     2 g100 mL/hr over 30 Minutes Intravenous On call to O.R. 08/08/14 1246 08/08/14 1431   08/08/14 1246  neomycin (MYCIFRADIN) tablet 1,000 mg  Status:  Discontinued     1,000 mg Oral 3 times per day 08/08/14 1246 08/08/14 1302   08/08/14 1246  erythromycin (E-MYCIN) tablet 1,000 mg  Status:  Discontinued     1,000 mg Oral 3 times per day 08/08/14 1246 08/08/14 1302      Assessment/Plan: s/p Procedure(s): LAPAROSCOPIC SIGMOID COLECTOMY /RIGID PROCTOSCOPY (N/A) No BM yet Ambulate Hopefully home Sunday.   LOS: 4 days    Amayrany Cafaro A. 08/12/2014

## 2014-08-13 LAB — GLUCOSE, CAPILLARY: Glucose-Capillary: 143 mg/dL — ABNORMAL HIGH (ref 70–99)

## 2014-08-13 MED ORDER — OXYCODONE-ACETAMINOPHEN 5-325 MG PO TABS: 1.0000 | ORAL_TABLET | ORAL | Status: DC | PRN

## 2014-08-13 NOTE — Discharge Summary (Signed)
Physician Discharge Summary  [name] MRN: [id number] DOB/AGE: Jan 09, 1946 69 y.o.  Admit date: 08/08/2014 Discharge date: 08/13/2014  Admission Diagnoses: sigmoid colon polyp  Discharge Diagnoses:  Active Problems:   Sessile colonic polyp   Discharged Condition: good  Hospital Course: unremarkable.  Had BM on POD 4 and diet advanced.  Good pain control and wounds CDI.   Consults: None  Significant Diagnostic Studies: none  Treatments: surgery: laparoscopic sigmoid colectomy  Discharge Exam: Blood pressure 126/47, pulse 64, temperature 98.8 F (37.1 C), temperature source Oral, resp. rate 16, height 5\' 2"  (1.575 m), weight 175 lb (79.379 kg), SpO2 96 %. Incision/Wound:soft CDI   Disposition: 01-Home or Self Care  Discharge Instructions    Discharge instructions    Complete by:  As directed   Soft diet     Increase activity slowly    Complete by:  As directed             Medication List    TAKE these medications        accu-chek multiclix lancets     amiodarone 200 MG tablet  Commonly known as:  PACERONE  Take 200 mg by mouth daily.     amitriptyline 25 MG tablet  Commonly known as:  ELAVIL  Take 25 mg by mouth at bedtime.     apixaban 5 MG Tabs tablet  Commonly known as:  ELIQUIS  Take 1 tablet (5 mg total) by mouth 2 (two) times daily.     atorvastatin 10 MG tablet  Commonly known as:  LIPITOR  Take 10 mg by mouth daily.     CARTIA XT 120 MG 24 hr capsule  Generic drug:  diltiazem  Take 120 mg by mouth daily.     cetirizine 10 MG tablet  Commonly known as:  ZYRTEC  Take 10 mg by mouth daily.     CHOLECALCIFEROL PO  Take 1 tablet by mouth daily.     famotidine 20 MG tablet  Commonly known as:  PEPCID  Take 20 mg by mouth 2 (two) times daily.     folic acid 1 MG tablet  Commonly known as:  FOLVITE  Take 1 mg by mouth daily.     furosemide 40 MG tablet  Commonly known as:  LASIX  Take 40 mg by mouth daily.      HYDROcodone-acetaminophen 5-325 MG per tablet  Commonly known as:  NORCO/VICODIN  Take 1 tablet by mouth every 6 (six) hours as needed (pain).     INSULIN SYRINGE 1CC/31GX5/16" 31G X 5/16" 1 ML Misc     metFORMIN 1000 MG tablet  Commonly known as:  GLUCOPHAGE  Take 1,000 mg by mouth 2 (two) times daily with a meal.     Methotrexate Sodium (PF) 250 MG/10ML Soln  Inject 0.8 mLs as directed every 7 (seven) days. Thursdays     multivitamin with minerals Tabs tablet  Take 1 tablet by mouth daily.     NOVOLIN 70/30 RELION (70-30) 100 UNIT/ML injection  Generic drug:  insulin NPH-regular Human  Inject 25-40 Units into the skin 2 (two) times daily with a meal. 40units in the morning and 25units in the evening     oxyCODONE-acetaminophen 5-325 MG per tablet  Commonly known as:  PERCOCET/ROXICET  Take 1-2 tablets by mouth every 4 (four) hours as needed for moderate pain.     oxyCODONE-acetaminophen 5-325 MG per tablet  Commonly known as:  ROXICET  Take 1 tablet by mouth every  4 (four) hours as needed.     potassium chloride SA 20 MEQ tablet  Commonly known as:  K-DUR,KLOR-CON  Take 1 tablet (20 mEq total) by mouth daily.     predniSONE 5 MG tablet  Commonly known as:  DELTASONE  Take 5 mg by mouth daily.     sertraline 100 MG tablet  Commonly known as:  ZOLOFT  Take 100 mg by mouth at bedtime.     sulfaSALAzine 500 MG tablet  Commonly known as:  AZULFIDINE  Take 500 mg by mouth 2 (two) times daily.     vitamin B-12 1000 MCG tablet  Commonly known as:  CYANOCOBALAMIN  Take 1,000 mcg by mouth daily.     zolpidem 10 MG tablet  Commonly known as:  AMBIEN  Take 10 mg by mouth at bedtime.           Follow-up Information    Follow up with HOXWORTH,BENJAMIN T, MD. Schedule an appointment as soon as possible for a visit in 3 weeks.   Specialty:  General Surgery   Contact information:   [address] [place] [address] [phone]        Signed: Hydee Fleece A. 08/13/2014, 9:07 AM

## 2014-08-13 NOTE — Progress Notes (Signed)
5 Days Post-Op  Subjective: Pt doing well.  Had BM.   Objective: Vital signs in last 24 hours: Temp:  [98.7 F (37.1 C)-98.9 F (37.2 C)] 98.8 F (37.1 C) (01/24 0555) Pulse Rate:  [62-64] 64 (01/24 0555) Resp:  [12-16] 16 (01/24 0555) BP: (118-140)/(34-56) 126/47 mmHg (01/24 0555) SpO2:  [94 %-98 %] 96 % (01/24 0555) Last BM Date: 08/08/14  Intake/Output from previous day: 01/23 0701 - 01/24 0700 In: 1658.3 [P.O.:480; I.V.:1178.3] Out: 2675 [Urine:2675] Intake/Output this shift:    General appearance: alert and cooperative Incision/Wound:CDI soft   Lab Results:   Recent Labs  08/11/14 0513 08/12/14 0503  WBC 10.2 7.3  HGB 7.4* 8.1*  HCT 24.1* 26.2*  PLT 476* 482*   BMET No results for input(s): NA, K, CL, CO2, GLUCOSE, BUN, CREATININE, CALCIUM in the last 72 hours. PT/INR No results for input(s): LABPROT, INR in the last 72 hours. ABG No results for input(s): PHART, HCO3 in the last 72 hours.  Invalid input(s): PCO2, PO2  Studies/Results: No results found.  Anti-infectives: Anti-infectives    Start     Dose/Rate Route Frequency Ordered Stop   08/09/14 0600  cefoTEtan (CEFOTAN) 2 g in dextrose 5 % 50 mL IVPB     2 g100 mL/hr over 30 Minutes Intravenous On call to O.R. 08/08/14 1246 08/08/14 1431   08/08/14 1246  neomycin (MYCIFRADIN) tablet 1,000 mg  Status:  Discontinued     1,000 mg Oral 3 times per day 08/08/14 1246 08/08/14 1302   08/08/14 1246  erythromycin (E-MYCIN) tablet 1,000 mg  Status:  Discontinued     1,000 mg Oral 3 times per day 08/08/14 1246 08/08/14 1302      Assessment/Plan: s/p Procedure(s): LAPAROSCOPIC SIGMOID COLECTOMY /RIGID PROCTOSCOPY (N/A) Discharge  LOS: 5 days    Garlon Tuggle A. 08/13/2014

## 2014-08-13 NOTE — Progress Notes (Signed)
Assessment unchanged. Pt verbalized understanding of dc instructions through teach back. Scripts x 1 given as provided by MD. Discharged via wc to meet husband and awaiting vehicle at front entrance to carry home. Accompanied by NT.

## 2014-08-18 ENCOUNTER — Telehealth: Payer: Self-pay | Admitting: Internal Medicine

## 2014-08-18 NOTE — Telephone Encounter (Signed)
EF is preserved.  No contraindication for diltiazem.  As diastolic dysfunction is likely due to afib and RVR, diltiazem is a preserved treatment.  Given ARB allergy (olmesartan), would not recommend ACE i or ARB.

## 2014-08-18 NOTE — Telephone Encounter (Signed)
New message      Calling with recommendations for the doctor Pt has heart failure---recommend an ACE or an ARB Pt is taking cartia XT--lead to worsening sytstoloc heart failure

## 2014-08-22 NOTE — Telephone Encounter (Signed)
lmom for Alison Bell with from [employer]

## 2014-09-05 ENCOUNTER — Other Ambulatory Visit: Payer: Self-pay | Admitting: Cardiovascular Disease

## 2014-09-07 NOTE — Telephone Encounter (Signed)
Rx refill sent to patient pharmacy   

## 2014-10-11 ENCOUNTER — Telehealth: Payer: Self-pay | Admitting: Cardiovascular Disease

## 2014-10-11 NOTE — Telephone Encounter (Signed)
°  1. Which medications need to be refilled? Furosemide 40mg   2. Which pharmacy is medication to be sent to?[facility] on [address]  3. Do they need a 30 day or 90 day supply? 90 days  4. Would they like a call back once the medication has been sent to the pharmacy? no

## 2014-10-11 NOTE — Telephone Encounter (Signed)
Furosemide refilled previously. Patient has not seen Dr. [name] since 12/2012. Called pharmacy staff to confirm receipt of this refill. This was verified.  90 tablet 2 09/07/2014

## 2014-11-08 ENCOUNTER — Other Ambulatory Visit: Payer: Self-pay

## 2014-11-09 ENCOUNTER — Ambulatory Visit (INDEPENDENT_AMBULATORY_CARE_PROVIDER_SITE_OTHER): Payer: Medicare HMO | Admitting: Internal Medicine

## 2014-11-09 ENCOUNTER — Encounter: Payer: Self-pay | Admitting: Internal Medicine

## 2014-11-09 ENCOUNTER — Other Ambulatory Visit: Payer: Self-pay

## 2014-11-09 VITALS — BP 128/64 | HR 52 | Ht 62.0 in | Wt 176.8 lb

## 2014-11-09 DIAGNOSIS — I481 Persistent atrial fibrillation: Secondary | ICD-10-CM

## 2014-11-09 DIAGNOSIS — I4819 Other persistent atrial fibrillation: Secondary | ICD-10-CM

## 2014-11-09 NOTE — Patient Instructions (Signed)
Medication Instructions:  Your physician recommends that you continue on your current medications as directed. Please refer to the Current Medication list given to you today.   Labwork: None ordered  Testing/Procedures: None ordered   Follow-Up: Your physician recommends that you schedule a follow-up appointment 3 months with Roderic Palau, NP   Any Other Special Instructions Will Be Listed Below (If Applicable).

## 2014-11-11 NOTE — Progress Notes (Signed)
Electrophysiology Office Note   Date:  11/11/2014   ID:  Alison Bell, DOB 07/02/1946, MRN [id number]  PCP:  [name], MD  Cardiologist:  Dr [name] Primary Electrophysiologist: [name], MD    Chief Complaint  Patient presents with  . Persistent AFIB     History of Present Illness: Alison Bell is a 69 y.o. female who presents today for electrophysiology evaluation.   She is doing reasonably well.  AF is controlled.  She has fallen out of bed again recently.  She denies presyncope or syncope.  Her activity is limited.  Today, she denies symptoms of palpitations, chest pain, shortness of breath, orthopnea, PND, lower extremity edema, claudication, dizziness, presyncope, syncope, bleeding, or neurologic sequela. The patient is tolerating medications without difficulties and is otherwise without complaint today.    Past Medical History  Diagnosis Date  . Diabetes mellitus without complication   . Persistent atrial fibrillation 08/25/2012  . HTN (hypertension) 08/25/2012  . Hyperlipidemia 08/25/2012  . Depression   . GERD (gastroesophageal reflux disease)   . LV dysfunction, EF 45-50% due to a. fib per echo 08/27/12  08/28/2012    a. Echo (10/15):  EF 55-60%, no RWMA, Gr 2 DD, mild MR, mod LAE, normal RVF, mild RAE, mild TR (LA 44 mm)  . Atrial enlargement, left   . Torsades de pointes     due to [facility] 04/2014  . Shortness of breath     intermitantly  . Headache     migraines  . Colon adenoma   . Anemia   . History of blood transfusion   . Difficulty sleeping     takes med  . Arthritis     rheumatoid   Past Surgical History  Procedure Laterality Date  . Tubal ligation    . Tee without cardioversion N/A 09/06/2012    Procedure: TRANSESOPHAGEAL ECHOCARDIOGRAM (TEE);  Surgeon: [name], MD;  Location: [facility] ENDOSCOPY;  Service: Cardiovascular;  Laterality: N/A;  . Cardioversion N/A 09/06/2012    Procedure: CARDIOVERSION;  Surgeon: [name], MD;   Location: [facility] ENDOSCOPY;  Service: Cardiovascular;  Laterality: N/A;  . Tee without cardioversion N/A 11/01/2012    Procedure: TRANSESOPHAGEAL ECHOCARDIOGRAM (TEE);  Surgeon: [name], MD;  Location: [place];  Service: Cardiovascular;  Laterality: N/A;  . Cardioversion N/A 01/07/2013    Procedure: CARDIOVERSION;  Surgeon: [name], MD;  Location[facility] ENDOSCOPY;  Service: Cardiovascular;  Laterality: N/A;  . Atrial fibrillation ablation  11/02/12    PVI by Dr [name]  . Cardioversion N/A 11/14/2013    Procedure: CARDIOVERSION;  Surgeon: [name], MD;  Location[facility] ENDOSCOPY;  Service: Cardiovascular;  Laterality: N/A;  . Atrial fibrillation ablation N/A 11/02/2012    Procedure: ATRIAL FIBRILLATION ABLATION;  Surgeon: [name], MD;  Location: [facility] CATH LAB;  Service: Cardiovascular;  Laterality: N/A;  . Colonoscopy N/A 07/28/2014    Procedure: COLONOSCOPY;  Surgeon: Ladene Artist, MD;  Location: [facility] ENDOSCOPY;  Service: Endoscopy;  Laterality: N/A;  . Laparoscopic sigmoid colectomy N/A 08/08/2014    Procedure: LAPAROSCOPIC SIGMOID COLECTOMY [facility] PROCTOSCOPY;  Surgeon: Excell Seltzer, MD;  Location: [facility] ORS;  Service: General;  Laterality: N/A;     Current Outpatient Prescriptions  Medication Sig Dispense Refill  . amiodarone (PACERONE) 200 MG tablet Take 200 mg by mouth daily.    Marland Kitchen amitriptyline (ELAVIL) 25 MG tablet Take 25 mg by mouth at bedtime.   3  . apixaban (ELIQUIS) 5 MG TABS  tablet Take 1 tablet (5 mg total) by mouth 2 (two) times daily. 180 tablet 2  . atorvastatin (LIPITOR) 10 MG tablet Take 10 mg by mouth daily.    Marland Kitchen CARTIA XT 120 MG 24 hr capsule Take 120 mg by mouth daily.  3  . cetirizine (ZYRTEC) 10 MG tablet Take 10 mg by mouth daily.    . CHOLECALCIFEROL PO Take 1 tablet by mouth daily.    . famotidine (PEPCID) 20 MG tablet Take 20 mg by mouth 2 (two) times daily.    . folic acid (FOLVITE) 1 MG tablet Take 1 mg by mouth daily.    . furosemide (LASIX)  40 MG tablet Take 1 tablet (40 mg total) by mouth daily. 90 tablet 2  . HYDROcodone-acetaminophen (NORCO/VICODIN) 5-325 MG per tablet Take 1 tablet by mouth every 6 (six) hours as needed (pain).     . Insulin Syringe-Needle U-100 (INSULIN SYRINGE 1CC/31GX5/16") 31G X 5/16" 1 ML MISC   0  . Lancets (ACCU-CHEK MULTICLIX) lancets   11  . Methotrexate Sodium, PF, 250 MG/10ML SOLN Inject 0.8 mLs as directed every 7 (seven) days. Thursdays    . Multiple Vitamin (MULTIVITAMIN WITH MINERALS) TABS Take 1 tablet by mouth daily.    Marland Kitchen NOVOLIN 70/30 RELION (70-30) 100 UNIT/ML injection Inject 25-40 Units into the skin 2 (two) times daily with a meal. 40units in the morning and 25units in the evening    . oxyCODONE-acetaminophen (ROXICET) 5-325 MG per tablet Take 1 tablet by mouth every 4 (four) hours as needed. 30 tablet 0  . potassium chloride SA (K-DUR,KLOR-CON) 20 MEQ tablet Take 1 tablet (20 mEq total) by mouth daily. 30 tablet 6  . predniSONE (DELTASONE) 5 MG tablet Take 5 mg by mouth daily.     . sertraline (ZOLOFT) 100 MG tablet Take 100 mg by mouth at bedtime.   1  . zolpidem (AMBIEN) 10 MG tablet Take 10 mg by mouth at bedtime.      No current facility-administered medications for this visit.    Allergies:   Benicar; Latex; Other; and Tikosyn   Social History:  The patient  reports that she quit smoking about 20 years ago. She has never used smokeless tobacco. She reports that she does not drink alcohol or use illicit drugs.   Family History:  The patient's  family history includes Diabetes type II in her father; Heart attack in her mother.    ROS:  Please see the history of present illness.   All other systems are reviewed and negative.    PHYSICAL EXAM: VS:  BP 128/64 mmHg  Pulse 52  Ht 5\' 2"  (1.575 m)  Wt 176 lb 12.8 oz (80.196 kg)  BMI 32.33 kg/m2 , BMI Body mass index is 32.33 kg/(m^2). GEN: Well nourished, well developed, in no acute distress HEENT: normal Neck: no JVD, carotid  bruits, or masses Cardiac: RRR; no murmurs, rubs, or gallops,no edema  Respiratory:  clear to auscultation bilaterally, normal work of breathing GI: soft, nontender, nondistended, + BS MS: no deformity or atrophy Skin: warm and dry  Neuro:  Strength and sensation are intact Psych: euthymic mood, full affect  EKG:  EKG is ordered today. The ekg ordered today shows sinus rhythm, RBBB   Recent Labs: 07/26/2014: ALT 25; TSH 0.336* 07/28/2014: Magnesium 1.7 08/10/2014: BUN 14; Creatinine 0.89; Potassium 4.4; Sodium 137 08/12/2014: Hemoglobin 8.1*; Platelets 482*    Lipid Panel     Component Value Date/Time   CHOL 153  05/02/2014 2050   TRIG 116 05/02/2014 2050   HDL 56 05/02/2014 2050   CHOLHDL 2.7 05/02/2014 2050   VLDL 23 05/02/2014 2050   LDLCALC 74 05/02/2014 2050     Wt Readings from Last 3 Encounters:  11/09/14 176 lb 12.8 oz (80.196 kg)  08/08/14 175 lb (79.379 kg)  08/03/14 182 lb (82.555 kg)      ASSESSMENT AND PLAN:  1.  Atrial fibrillation Stable No changes today  Follow-up with Roderic Palau NP in the AF clinic every 3 months I will see when needed  Current medicines are reviewed at length with the patient today.   The patient does not have concerns regarding her medicines.  The following changes were made today:  none  Signed, [name], MD  11/11/2014 10:49 AM     [facility] [address] [place]  [address] [phone] (office) [phone] (fax)

## 2015-02-08 ENCOUNTER — Ambulatory Visit (HOSPITAL_COMMUNITY)
Admission: RE | Admit: 2015-02-08 | Discharge: 2015-02-08 | Disposition: A | Discharge status: Home / Self Care | Payer: Medicare HMO | Source: Ambulatory Visit | Attending: Nurse Practitioner | Admitting: Nurse Practitioner

## 2015-02-08 ENCOUNTER — Encounter: Payer: Self-pay | Admitting: Cardiovascular Disease

## 2015-02-08 ENCOUNTER — Encounter (HOSPITAL_COMMUNITY): Payer: Self-pay | Admitting: Nurse Practitioner

## 2015-02-08 VITALS — BP 150/78 | HR 60 | Ht 62.0 in | Wt 185.4 lb

## 2015-02-08 DIAGNOSIS — I10 Essential (primary) hypertension: Secondary | ICD-10-CM | POA: Insufficient documentation

## 2015-02-08 DIAGNOSIS — Z87891 Personal history of nicotine dependence: Secondary | ICD-10-CM | POA: Diagnosis not present

## 2015-02-08 DIAGNOSIS — I4819 Other persistent atrial fibrillation: Secondary | ICD-10-CM

## 2015-02-08 DIAGNOSIS — I481 Persistent atrial fibrillation: Secondary | ICD-10-CM | POA: Diagnosis not present

## 2015-02-08 DIAGNOSIS — I4891 Unspecified atrial fibrillation: Secondary | ICD-10-CM | POA: Insufficient documentation

## 2015-02-08 LAB — COMPREHENSIVE METABOLIC PANEL
ALT: 13 U/L — ABNORMAL LOW (ref 14–54)
AST: 18 U/L (ref 15–41)
Albumin: 3.5 g/dL (ref 3.5–5.0)
Alkaline Phosphatase: 60 U/L (ref 38–126)
Anion gap: 7 (ref 5–15)
BUN: 21 mg/dL — ABNORMAL HIGH (ref 6–20)
CO2: 30 mmol/L (ref 22–32)
Calcium: 9.5 mg/dL (ref 8.9–10.3)
Chloride: 103 mmol/L (ref 101–111)
Creatinine, Ser: 1.39 mg/dL — ABNORMAL HIGH (ref 0.44–1.00)
GFR calc Af Amer: 43 mL/min — ABNORMAL LOW (ref >60)
GFR calc non Af Amer: 37 mL/min — ABNORMAL LOW (ref >60)
Glucose, Bld: 89 mg/dL (ref 65–99)
Potassium: 4.3 mmol/L (ref 3.5–5.1)
Sodium: 140 mmol/L (ref 135–145)
Total Bilirubin: 0.3 mg/dL (ref 0.3–1.2)
Total Protein: 7.1 g/dL (ref 6.5–8.1)

## 2015-02-08 LAB — TSH: TSH: 0.496 u[IU]/mL (ref 0.350–4.500)

## 2015-02-08 NOTE — Patient Instructions (Signed)
Parking code for October 0900

## 2015-02-08 NOTE — Progress Notes (Signed)
[name], female   DOB: 1945-10-24, 69 y.o.   MRN: [id number]       Date:  02/08/2015   PCP:  [name], MD  Cardiologist:  Dr [name] Primary Electrophysiologist: Roderic Palau, NP    Chief Complaint  Patient presents with  . Atrial Fibrillation     History of Present Illness: Alison Bell is a 69 y.o. female who presents today for electrophysiology evaluation.   She is doing reasonably well. She is maintaining SR with amiodarone. She denies presyncope or syncope.  She developed shingles 5 weeks ago and this is resolving. She has noticed any irregular heart beat. Overall has no complaints other that the resolving shingles.  Today, she denies symptoms of palpitations, chest pain, shortness of breath, orthopnea, PND, lower extremity edema, claudication, dizziness, presyncope, syncope, bleeding, or neurologic sequela. The patient is tolerating medications without difficulties and is otherwise without complaint today.    Past Medical History  Diagnosis Date  . Diabetes mellitus without complication   . Persistent atrial fibrillation 08/25/2012  . HTN (hypertension) 08/25/2012  . Hyperlipidemia 08/25/2012  . Depression   . GERD (gastroesophageal reflux disease)   . LV dysfunction, EF 45-50% due to a. fib per echo 08/27/12  08/28/2012    a. Echo (10/15):  EF 55-60%, no RWMA, Gr 2 DD, mild MR, mod LAE, normal RVF, mild RAE, mild TR (LA 44 mm)  . Atrial enlargement, left   . Torsades de pointes     due to [facility] 04/2014  . Shortness of breath     intermitantly  . Headache     migraines  . Colon adenoma   . Anemia   . History of blood transfusion   . Difficulty sleeping     takes med  . Arthritis     rheumatoid   Past Surgical History  Procedure Laterality Date  . Tubal ligation    . Tee without cardioversion N/A 09/06/2012    Procedure: TRANSESOPHAGEAL ECHOCARDIOGRAM (TEE);  Surgeon: [name], MD;  Location: [facility] ENDOSCOPY;  Service: Cardiovascular;   Laterality: N/A;  . Cardioversion N/A 09/06/2012    Procedure: CARDIOVERSION;  Surgeon: [name], MD;  Location: [facility] ENDOSCOPY;  Service: Cardiovascular;  Laterality: N/A;  . Tee without cardioversion N/A 11/01/2012    Procedure: TRANSESOPHAGEAL ECHOCARDIOGRAM (TEE);  Surgeon: [name], MD;  Location: [place];  Service: Cardiovascular;  Laterality: N/A;  . Cardioversion N/A 01/07/2013    Procedure: CARDIOVERSION;  Surgeon: [name], MD;  Location[facility] ENDOSCOPY;  Service: Cardiovascular;  Laterality: N/A;  . Atrial fibrillation ablation  11/02/12    PVI by Dr [name]  . Cardioversion N/A 11/14/2013    Procedure: CARDIOVERSION;  Surgeon: [name], MD;  Location: [facility] ENDOSCOPY;  Service: Cardiovascular;  Laterality: N/A;  . Atrial fibrillation ablation N/A 11/02/2012    Procedure: ATRIAL FIBRILLATION ABLATION;  Surgeon: [name], MD;  Location[facility] CATH LAB;  Service: Cardiovascular;  Laterality: N/A;  . Colonoscopy N/A 07/28/2014    Procedure: COLONOSCOPY;  Surgeon: Ladene Artist, MD;  Location: [facility] ENDOSCOPY;  Service: Endoscopy;  Laterality: N/A;  . Laparoscopic sigmoid colectomy N/A 08/08/2014    Procedure: LAPAROSCOPIC SIGMOID COLECTOMY Syl[facility] Farrier PROCTOSCOPY;  Surgeon: Excell Seltzer, MD;  Location: [facility] ORS;  Service: General;  Laterality: N/A;     Current Outpatient Prescriptions  Medication Sig Dispense Refill  . amiodarone (PACERONE) 200 MG tablet Take 200 mg by mouth daily.    Marland Kitchen amitriptyline (ELAVIL) 25  MG tablet Take 25 mg by mouth at bedtime.   3  . apixaban (ELIQUIS) 5 MG TABS tablet Take 1 tablet (5 mg total) by mouth 2 (two) times daily. 180 tablet 2  . atorvastatin (LIPITOR) 10 MG tablet Take 10 mg by mouth daily.    Marland Kitchen CARTIA XT 120 MG 24 hr capsule Take 120 mg by mouth daily.  3  . cetirizine (ZYRTEC) 10 MG tablet Take 10 mg by mouth daily.    . CHOLECALCIFEROL PO Take 1 tablet by mouth daily.    . famotidine (PEPCID) 20 MG tablet Take 20 mg by mouth  2 (two) times daily.    . folic acid (FOLVITE) 1 MG tablet Take 1 mg by mouth daily.    . furosemide (LASIX) 40 MG tablet Take 1 tablet (40 mg total) by mouth daily. 90 tablet 2  . gabapentin (NEURONTIN) 300 MG capsule Take 300 mg by mouth 3 (three) times daily.    Marland Kitchen HYDROcodone-acetaminophen (NORCO/VICODIN) 5-325 MG per tablet Take 1 tablet by mouth every 6 (six) hours as needed (pain).     . Methotrexate Sodium, PF, 250 MG/10ML SOLN Inject 0.8 mLs as directed every 7 (seven) days. Thursdays    . Multiple Vitamin (MULTIVITAMIN WITH MINERALS) TABS Take 1 tablet by mouth daily.    Marland Kitchen NOVOLIN 70/30 RELION (70-30) 100 UNIT/ML injection Inject 25-40 Units into the skin 2 (two) times daily with a meal. 40units in the morning and 25units in the evening    . oxyCODONE-acetaminophen (ROXICET) 5-325 MG per tablet Take 1 tablet by mouth every 4 (four) hours as needed. 30 tablet 0  . potassium chloride SA (K-DUR,KLOR-CON) 20 MEQ tablet Take 1 tablet (20 mEq total) by mouth daily. 30 tablet 6  . predniSONE (DELTASONE) 5 MG tablet Take 5 mg by mouth daily.     . sertraline (ZOLOFT) 100 MG tablet Take 100 mg by mouth at bedtime.   1  . traZODone (DESYREL) 50 MG tablet Take 50 mg by mouth at bedtime.    . Insulin Syringe-Needle U-100 (INSULIN SYRINGE 1CC/31GX5/16") 31G X 5/16" 1 ML MISC   0  . Lancets (ACCU-CHEK MULTICLIX) lancets   11   No current facility-administered medications for this encounter.    Allergies:   Benicar; Latex; Other; and Tikosyn   Social History:  The patient  reports that she quit smoking about 20 years ago. She has never used smokeless tobacco. She reports that she does not drink alcohol or use illicit drugs.   Family History:  The patient's  family history includes Diabetes type II in her father; Heart attack in her maternal grandfather, maternal grandmother, mother, and paternal grandmother.    ROS:  Please see the history of present illness.   All other systems are reviewed and  negative.    PHYSICAL EXAM: VS:  BP 150/78 mmHg  Pulse 60  Ht 5\' 2"  (1.575 m)  Wt 185 lb 6.4 oz (84.097 kg)  BMI 33.90 kg/m2 , BMI Body mass index is 33.9 kg/(m^2). GEN: Well nourished, well developed, in no acute distress HEENT: normal Neck: no JVD, carotid bruits, or masses Cardiac: RRR; no murmurs, rubs, or gallops,no edema  Respiratory:  clear to auscultation bilaterally, normal work of breathing GI: soft, nontender, nondistended, + BS MS: no deformity or atrophy Skin: warm and dry  Neuro:  Strength and sensation are intact Psych: euthymic mood, full affect  EKG:  EKG is ordered today. The ekg ordered today shows sinus rhythm,  RBBB at 60 bpm.    Lipid Panel     Component Value Date/Time   CHOL 153 05/02/2014 2050   TRIG 116 05/02/2014 2050   HDL 56 05/02/2014 2050   CHOLHDL 2.7 05/02/2014 2050   VLDL 23 05/02/2014 2050   LDLCALC 74 05/02/2014 2050     Wt Readings from Last 3 Encounters:  02/08/15 185 lb 6.4 oz (84.097 kg)  11/09/14 176 lb 12.8 oz (80.196 kg)  08/08/14 175 lb (79.379 kg)      ASSESSMENT AND PLAN:  1.  Atrial fibrillation Maintaining SR on amiodarone  Continue apixaban 5 mg bid  Tsh/liver bmet today.  2. HTN Mildly elevated today.  Avoid salt  Will see back in 3 months   Signed, Thong Feeny, NP  02/08/2015 1:07 PM

## 2015-04-25 ENCOUNTER — Other Ambulatory Visit: Payer: Self-pay | Admitting: Internal Medicine

## 2015-05-10 ENCOUNTER — Inpatient Hospital Stay (HOSPITAL_COMMUNITY): Admission: RE | Admit: 2015-05-10 | Payer: Medicare HMO | Source: Ambulatory Visit | Admitting: Nurse Practitioner

## 2015-05-30 ENCOUNTER — Encounter (HOSPITAL_COMMUNITY): Payer: Self-pay | Admitting: Nurse Practitioner

## 2015-05-30 ENCOUNTER — Ambulatory Visit (HOSPITAL_COMMUNITY)
Admission: RE | Admit: 2015-05-30 | Discharge: 2015-05-30 | Disposition: A | Discharge status: Home / Self Care | Payer: Medicare HMO | Source: Ambulatory Visit | Attending: Nurse Practitioner | Admitting: Nurse Practitioner

## 2015-05-30 VITALS — BP 158/66 | HR 65 | Ht 62.0 in | Wt 208.8 lb

## 2015-05-30 DIAGNOSIS — I481 Persistent atrial fibrillation: Secondary | ICD-10-CM

## 2015-05-30 DIAGNOSIS — I4819 Other persistent atrial fibrillation: Secondary | ICD-10-CM

## 2015-05-30 DIAGNOSIS — I4891 Unspecified atrial fibrillation: Secondary | ICD-10-CM | POA: Diagnosis not present

## 2015-05-30 NOTE — Progress Notes (Signed)
[name], female   DOB: 03-Nov-1945, 69 y.o.   MRN: [id number]          Date:  05/30/2015   ID:  Alison Bell, DOB Oct 19, 1945, MRN [id number]  PCP:  [name], MD  Cardiologist:  Dr [name] Primary Electrophysiologist: Roderic Palau, NP    Chief Complaint  Patient presents with  . Atrial Fibrillation     History of Present Illness: Alison Bell is a 69 y.o. female who presents today for electrophysiology evaluation.   She has not noticed any irregular heartbeat.  AF is controlled on amiodarone.  H/o falls of out of bed, none recently. Does feel short of breath at times, will obtain PFT's as she has been on amiodarone x one year now. She denies presyncope or syncope.  No issues with blood thinner or bleeding.  Today, she denies symptoms of palpitations, chest pain,  orthopnea, PND, lower extremity edema, claudication, dizziness, presyncope, syncope, bleeding, or neurologic sequela..Mild shortness of breath.The patient is tolerating medications without difficulties and is otherwise without complaint today.    Past Medical History  Diagnosis Date  . Diabetes mellitus without complication ([place])   . Persistent atrial fibrillation ([place]) 08/25/2012  . HTN (hypertension) 08/25/2012  . Hyperlipidemia 08/25/2012  . Depression   . GERD (gastroesophageal reflux disease)   . LV dysfunction, EF 45-50% due to a. fib per echo 08/27/12  08/28/2012    a. Echo (10/15):  EF 55-60%, no RWMA, Gr 2 DD, mild MR, mod LAE, normal RVF, mild RAE, mild TR (LA 44 mm)  . Atrial enlargement, left   . Torsades de pointes ([place])     due to [facility] 04/2014  . Shortness of breath     intermitantly  . Headache     migraines  . Colon adenoma   . Anemia   . History of blood transfusion   . Difficulty sleeping     takes med  . Arthritis     rheumatoid   Past Surgical History  Procedure Laterality Date  . Tubal ligation    . Tee without cardioversion N/A 09/06/2012    Procedure: TRANSESOPHAGEAL  ECHOCARDIOGRAM (TEE);  Surgeon: [name], MD;  Location: [facility] ENDOSCOPY;  Service: Cardiovascular;  Laterality: N/A;  . Cardioversion N/A 09/06/2012    Procedure: CARDIOVERSION;  Surgeon: [name], MD;  Location: [facility] ENDOSCOPY;  Service: Cardiovascular;  Laterality: N/A;  . Tee without cardioversion N/A 11/01/2012    Procedure: TRANSESOPHAGEAL ECHOCARDIOGRAM (TEE);  Surgeon: [name], MD;  Location: [place];  Service: Cardiovascular;  Laterality: N/A;  . Cardioversion N/A 01/07/2013    Procedure: CARDIOVERSION;  Surgeon: [name], MD;  Location: [facility] ENDOSCOPY;  Service: Cardiovascular;  Laterality: N/A;  . Atrial fibrillation ablation  11/02/12    PVI by Dr [name]  . Cardioversion N/A 11/14/2013    Procedure: CARDIOVERSION;  Surgeon: [name], MD;  Location: [facility] ENDOSCOPY;  Service: Cardiovascular;  Laterality: N/A;  . Atrial fibrillation ablation N/A 11/02/2012    Procedure: ATRIAL FIBRILLATION ABLATION;  Surgeon: [name], MD;  Location: [facility] CATH LAB;  Service: Cardiovascular;  Laterality: N/A;  . Colonoscopy N/A 07/28/2014    Procedure: COLONOSCOPY;  Surgeon: Ladene Artist, MD;  Location: [facility] ENDOSCOPY;  Service: Endoscopy;  Laterality: N/A;  . Laparoscopic sigmoid colectomy N/A 08/08/2014    Procedure: LAPAROSCOPIC SIGMOID COLECTOMY Sylvie Farrier PROCTOSCOPY;  Surgeon: Excell Seltzer, MD;  Location: [facility] ORS;  Service: General;  Laterality: N/A;  Current Outpatient Prescriptions  Medication Sig Dispense Refill  . amiodarone (PACERONE) 200 MG tablet Take 200 mg by mouth daily.    Marland Kitchen amitriptyline (ELAVIL) 25 MG tablet Take 25 mg by mouth at bedtime.   3  . apixaban (ELIQUIS) 5 MG TABS tablet Take 1 tablet (5 mg total) by mouth 2 (two) times daily. 180 tablet 2  . atorvastatin (LIPITOR) 10 MG tablet Take 10 mg by mouth daily.    Marland Kitchen CARTIA XT 120 MG 24 hr capsule Take 120 mg by mouth daily.  3  . cetirizine (ZYRTEC) 10 MG tablet Take 10 mg by mouth daily.    .  CHOLECALCIFEROL PO Take 1 tablet by mouth daily.    . famotidine (PEPCID) 20 MG tablet Take 20 mg by mouth 2 (two) times daily.    . folic acid (FOLVITE) 1 MG tablet Take 1 mg by mouth daily.    . furosemide (LASIX) 40 MG tablet Take 1 tablet (40 mg total) by mouth daily. 90 tablet 2  . gabapentin (NEURONTIN) 300 MG capsule Take 300 mg by mouth as needed.     Marland Kitchen HYDROcodone-acetaminophen (NORCO/VICODIN) 5-325 MG per tablet Take 1 tablet by mouth every 6 (six) hours as needed (pain).     . Insulin Syringe-Needle U-100 (INSULIN SYRINGE 1CC/31GX5/16") 31G X 5/16" 1 ML MISC   0  . Lancets (ACCU-CHEK MULTICLIX) lancets   11  . Methotrexate Sodium, PF, 250 MG/10ML SOLN Inject 0.8 mLs as directed every 7 (seven) days. Thursdays    . Multiple Vitamin (MULTIVITAMIN WITH MINERALS) TABS Take 1 tablet by mouth daily.    Marland Kitchen NOVOLIN 70/30 RELION (70-30) 100 UNIT/ML injection Inject 25-40 Units into the skin 2 (two) times daily with a meal. 40units in the morning and 25units in the evening    . potassium chloride SA (K-DUR,KLOR-CON) 20 MEQ tablet Take 1 tablet (20 mEq total) by mouth daily. 30 tablet 6  . predniSONE (DELTASONE) 5 MG tablet Take 5 mg by mouth daily.     . sertraline (ZOLOFT) 100 MG tablet Take 100 mg by mouth at bedtime.   1  . traZODone (DESYREL) 50 MG tablet Take 50 mg by mouth at bedtime.    Marland Kitchen oxyCODONE-acetaminophen (ROXICET) 5-325 MG per tablet Take 1 tablet by mouth every 4 (four) hours as needed. (Patient not taking: Reported on 05/30/2015) 30 tablet 0   No current facility-administered medications for this encounter.    Allergies:   Benicar; Latex; Other; and Tikosyn   Social History:  The patient  reports that she quit smoking about 20 years ago. She has never used smokeless tobacco. She reports that she does not drink alcohol or use illicit drugs.   Family History:  The patient's  family history includes Diabetes type II in her father; Heart attack in her maternal grandfather,  maternal grandmother, mother, and paternal grandmother.    ROS:  Please see the history of present illness.   All other systems are reviewed and negative.    PHYSICAL EXAM: VS:  BP 158/66 mmHg  Pulse 65  Ht 5\' 2"  (1.575 m)  Wt 208 lb 12.8 oz (94.711 kg)  BMI 38.18 kg/m2 , BMI Body mass index is 38.18 kg/(m^2). GEN: Well nourished, well developed, in no acute distress HEENT: normal Neck: no JVD, carotid bruits, or masses Cardiac: RRR; no murmurs, rubs, or gallops,no edema  Respiratory:  clear to auscultation bilaterally, normal work of breathing GI: soft, nontender, nondistended, + BS MS: no deformity  or atrophy Skin: warm and dry  Neuro:  Strength and sensation are intact Psych: euthymic mood, full affect  EKG:  EKG is ordered today. The ekg ordered today shows sinus rhythm, RBBB   Recent Labs: 07/28/2014: Magnesium 1.7 08/12/2014: Hemoglobin 8.1*; Platelets 482* 02/08/2015: ALT 13*; BUN 21*; Creatinine, Ser 1.39*; Potassium 4.3; Sodium 140; TSH 0.496    Lipid Panel     Component Value Date/Time   CHOL 153 05/02/2014 2050   TRIG 116 05/02/2014 2050   HDL 56 05/02/2014 2050   CHOLHDL 2.7 05/02/2014 2050   VLDL 23 05/02/2014 2050   LDLCALC 74 05/02/2014 2050     Wt Readings from Last 3 Encounters:  05/30/15 208 lb 12.8 oz (94.711 kg)  02/08/15 185 lb 6.4 oz (84.097 kg)  11/09/14 176 lb 12.8 oz (80.196 kg)      ASSESSMENT AND PLAN:  1.  Atrial fibrillation Stable Continue amiodarone 200 mg a day PFT's ordered Continue apixaban 5 mg bid   Will see pt on f/u in 3 months and will obtain  cbc, cmet  Signed, Sharra Cayabyab, NP  05/30/2015 9:45 AM     [facility] [address] [place] [address] [phone] (office) [phone] (fax)

## 2015-05-30 NOTE — Patient Instructions (Signed)
Your physician has recommended that you have a pulmonary function test. Pulmonary Function Tests are a group of tests that measure how well air moves in and out of your lungs.  No Smoking, caffeine or inhaler use 4 hours prior to appointment.  Go to [employer] and ask to be taken to respiratory. Use valet parking

## 2015-06-05 ENCOUNTER — Ambulatory Visit (HOSPITAL_COMMUNITY)
Admission: RE | Admit: 2015-06-05 | Discharge: 2015-06-05 | Disposition: A | Discharge status: Home / Self Care | Payer: Medicare HMO | Source: Ambulatory Visit | Attending: Nurse Practitioner | Admitting: Nurse Practitioner

## 2015-06-05 DIAGNOSIS — R0609 Other forms of dyspnea: Secondary | ICD-10-CM | POA: Insufficient documentation

## 2015-06-05 DIAGNOSIS — I4819 Other persistent atrial fibrillation: Secondary | ICD-10-CM

## 2015-06-05 DIAGNOSIS — R062 Wheezing: Secondary | ICD-10-CM | POA: Insufficient documentation

## 2015-06-05 DIAGNOSIS — I481 Persistent atrial fibrillation: Secondary | ICD-10-CM | POA: Insufficient documentation

## 2015-06-05 DIAGNOSIS — F17210 Nicotine dependence, cigarettes, uncomplicated: Secondary | ICD-10-CM | POA: Diagnosis not present

## 2015-06-05 LAB — PULMONARY FUNCTION TEST
DL/VA % pred: 84 %
DL/VA: 3.84 ml/min/mmHg/L
DLCO unc % pred: 60 %
DLCO unc: 13.01 ml/min/mmHg
FEF 25-75 Post: 1.81 L/sec
FEF 25-75 Pre: 1.93 L/sec
FEF2575-%Change-Post: -6 %
FEF2575-%Pred-Post: 105 %
FEF2575-%Pred-Pre: 112 %
FEV1-%Change-Post: -2 %
FEV1-%Pred-Post: 86 %
FEV1-%Pred-Pre: 88 %
FEV1-Post: 1.76 L
FEV1-Pre: 1.8 L
FEV1FVC-%Change-Post: 5 %
FEV1FVC-%Pred-Pre: 107 %
FEV6-%Change-Post: -7 %
FEV6-%Pred-Post: 79 %
FEV6-%Pred-Pre: 85 %
FEV6-Post: 2.04 L
FEV6-Pre: 2.2 L
FEV6FVC-%Pred-Post: 105 %
FEV6FVC-%Pred-Pre: 105 %
FVC-%Change-Post: -7 %
FVC-%Pred-Post: 75 %
FVC-%Pred-Pre: 81 %
FVC-Post: 2.04 L
FVC-Pre: 2.2 L
Post FEV1/FVC ratio: 86 %
Post FEV6/FVC ratio: 100 %
Pre FEV1/FVC ratio: 82 %
Pre FEV6/FVC Ratio: 100 %
RV % pred: 95 %
RV: 2.03 L
TLC % pred: 88 %
TLC: 4.21 L

## 2015-06-05 MED ORDER — ALBUTEROL SULFATE (2.5 MG/3ML) 0.083% IN NEBU
2.5000 mg | INHALATION_SOLUTION | Freq: Once | RESPIRATORY_TRACT | Status: AC
  Administered 2015-06-05: 2.5 mg via RESPIRATORY_TRACT

## 2015-07-02 ENCOUNTER — Other Ambulatory Visit: Payer: Self-pay | Admitting: Internal Medicine

## 2015-07-25 ENCOUNTER — Other Ambulatory Visit: Payer: Self-pay | Admitting: Cardiovascular Disease

## 2015-07-25 ENCOUNTER — Other Ambulatory Visit: Payer: Self-pay | Admitting: *Deleted

## 2015-07-25 MED ORDER — DILTIAZEM HCL ER COATED BEADS 120 MG PO CP24: 120.0000 mg | ORAL_CAPSULE | Freq: Every day | ORAL | Status: DC

## 2015-07-25 NOTE — Telephone Encounter (Signed)
Rx request sent to pharmacy.  

## 2015-08-07 DIAGNOSIS — E1165 Type 2 diabetes mellitus with hyperglycemia: Secondary | ICD-10-CM | POA: Diagnosis not present

## 2015-08-28 DIAGNOSIS — Z79899 Other long term (current) drug therapy: Secondary | ICD-10-CM | POA: Diagnosis not present

## 2015-08-28 DIAGNOSIS — M0579 Rheumatoid arthritis with rheumatoid factor of multiple sites without organ or systems involvement: Secondary | ICD-10-CM | POA: Diagnosis not present

## 2015-08-30 ENCOUNTER — Other Ambulatory Visit (HOSPITAL_COMMUNITY): Payer: Self-pay | Admitting: *Deleted

## 2015-08-30 ENCOUNTER — Ambulatory Visit (HOSPITAL_COMMUNITY)
Admission: RE | Admit: 2015-08-30 | Discharge: 2015-08-30 | Disposition: A | Discharge status: Home / Self Care | Payer: PPO | Source: Ambulatory Visit | Attending: Nurse Practitioner | Admitting: Nurse Practitioner

## 2015-08-30 ENCOUNTER — Encounter (HOSPITAL_COMMUNITY): Payer: Self-pay | Admitting: Nurse Practitioner

## 2015-08-30 VITALS — BP 140/70 | HR 56 | Ht 63.0 in | Wt 211.0 lb

## 2015-08-30 DIAGNOSIS — R931 Abnormal findings on diagnostic imaging of heart and coronary circulation: Secondary | ICD-10-CM | POA: Diagnosis not present

## 2015-08-30 DIAGNOSIS — R9431 Abnormal electrocardiogram [ECG] [EKG]: Secondary | ICD-10-CM | POA: Insufficient documentation

## 2015-08-30 DIAGNOSIS — I4891 Unspecified atrial fibrillation: Secondary | ICD-10-CM | POA: Diagnosis not present

## 2015-08-30 DIAGNOSIS — I498 Other specified cardiac arrhythmias: Secondary | ICD-10-CM | POA: Diagnosis not present

## 2015-08-30 DIAGNOSIS — I517 Cardiomegaly: Secondary | ICD-10-CM | POA: Insufficient documentation

## 2015-08-30 DIAGNOSIS — I4510 Unspecified right bundle-branch block: Secondary | ICD-10-CM | POA: Diagnosis not present

## 2015-08-30 DIAGNOSIS — R05 Cough: Secondary | ICD-10-CM | POA: Insufficient documentation

## 2015-08-30 DIAGNOSIS — I481 Persistent atrial fibrillation: Secondary | ICD-10-CM

## 2015-08-30 DIAGNOSIS — I4819 Other persistent atrial fibrillation: Secondary | ICD-10-CM

## 2015-08-30 DIAGNOSIS — R0602 Shortness of breath: Secondary | ICD-10-CM | POA: Diagnosis not present

## 2015-08-30 DIAGNOSIS — R001 Bradycardia, unspecified: Secondary | ICD-10-CM | POA: Diagnosis not present

## 2015-08-30 DIAGNOSIS — E119 Type 2 diabetes mellitus without complications: Secondary | ICD-10-CM | POA: Diagnosis not present

## 2015-08-30 LAB — TSH: TSH: 0.291 u[IU]/mL — ABNORMAL LOW (ref 0.350–4.500)

## 2015-08-30 LAB — T4, FREE: Free T4: 1.17 ng/dL — ABNORMAL HIGH (ref 0.61–1.12)

## 2015-08-30 NOTE — Progress Notes (Signed)
[name], female   DOB: [birth year], 70 y.o.   MRN: [id number]       Date:  08/30/2015   PCP:  [name], MD  Cardiologist:  Dr [name] Primary Electrophysiologist: Roderic Palau, NP    Chief Complaint  Patient presents with  . Atrial Fibrillation     History of Present Illness: Alison Bell is a 70 y.o. female who presents today for f/u in the afib clinic.  She is doing reasonably well. She is maintaining SR with amiodarone. She denies presyncope or syncope.  She c/o of shortness of breath but has gained around 20 lbs over the last several months which she contributes to prednisone use.   She has not noticed any irregular heart beat. Shortness of breath is usually with exertion.  Today, she denies symptoms of palpitations, chest pain,orthopnea, PND, lower extremity edema, claudication, dizziness, presyncope, syncope, bleeding, or neurologic sequela. The patient is tolerating medications without difficulties and is otherwise without complaint today.    Past Medical History  Diagnosis Date  . Diabetes mellitus without complication ([place])   . Persistent atrial fibrillation ([place]) 08/25/2012  . HTN (hypertension) 08/25/2012  . Hyperlipidemia 08/25/2012  . Depression   . GERD (gastroesophageal reflux disease)   . LV dysfunction, EF 45-50% due to a. fib per echo 08/27/12  08/28/2012    a. Echo (10/15):  EF 55-60%, no RWMA, Gr 2 DD, mild MR, mod LAE, normal RVF, mild RAE, mild TR (LA 44 mm)  . Atrial enlargement, left   . Torsades de pointes ([place])     due to [facility] 04/2014  . Shortness of breath     intermitantly  . Headache     migraines  . Colon adenoma   . Anemia   . History of blood transfusion   . Difficulty sleeping     takes med  . Arthritis     rheumatoid   Past Surgical History  Procedure Laterality Date  . Tubal ligation    . Tee without cardioversion N/A 09/06/2012    Procedure: TRANSESOPHAGEAL ECHOCARDIOGRAM (TEE);  Surgeon: [name], MD;   Location: [facility] ENDOSCOPY;  Service: Cardiovascular;  Laterality: N/A;  . Cardioversion N/A 09/06/2012    Procedure: CARDIOVERSION;  Surgeon: [name], MD;  Location: [facility] ENDOSCOPY;  Service: Cardiovascular;  Laterality: N/A;  . Tee without cardioversion N/A 11/01/2012    Procedure: TRANSESOPHAGEAL ECHOCARDIOGRAM (TEE);  Surgeon: [name], MD;  Location: [place];  Service: Cardiovascular;  Laterality: N/A;  . Cardioversion N/A 01/07/2013    Procedure: CARDIOVERSION;  Surgeon: [name], MD;  Location: [facility] ENDOSCOPY;  Service: Cardiovascular;  Laterality: N/A;  . Atrial fibrillation ablation  11/02/12    PVI by Dr [name]  . Cardioversion N/A 11/14/2013    Procedure: CARDIOVERSION;  Surgeon: [name], MD;  Location: [facility] ENDOSCOPY;  Service: Cardiovascular;  Laterality: N/A;  . Atrial fibrillation ablation N/A 11/02/2012    Procedure: ATRIAL FIBRILLATION ABLATION;  Surgeon: [name], MD;  Location: [facility] CATH LAB;  Service: Cardiovascular;  Laterality: N/A;  . Colonoscopy N/A 07/28/2014    Procedure: COLONOSCOPY;  Surgeon: Ladene Artist, MD;  Location: [facility] ENDOSCOPY;  Service: Endoscopy;  Laterality: N/A;  . Laparoscopic sigmoid colectomy N/A 08/08/2014    Procedure: LAPAROSCOPIC SIGMOID COLECTOMY Sylvie Farrier PROCTOSCOPY;  Surgeon: Excell Seltzer, MD;  Location: [facility] ORS;  Service: General;  Laterality: N/A;     Current Outpatient Prescriptions  Medication Sig Dispense Refill  . amiodarone (PACERONE) 200  MG tablet Take 200 mg by mouth daily.    Marland Kitchen amitriptyline (ELAVIL) 25 MG tablet Take 25 mg by mouth at bedtime.   3  . atorvastatin (LIPITOR) 10 MG tablet Take 10 mg by mouth daily.    . cetirizine (ZYRTEC) 10 MG tablet Take 10 mg by mouth daily.    . CHOLECALCIFEROL PO Take 1 tablet by mouth daily.    Marland Kitchen diltiazem (CARTIA XT) 120 MG 24 hr capsule Take 1 capsule (120 mg total) by mouth daily. 90 capsule 0  . ELIQUIS 5 MG TABS tablet TAKE 1 TABLET BY MOUTH TWICE DAILY 180 tablet  2  . famotidine (PEPCID) 20 MG tablet Take 20 mg by mouth 2 (two) times daily.    . folic acid (FOLVITE) 1 MG tablet Take 1 mg by mouth daily.    . furosemide (LASIX) 40 MG tablet TAKE 1 TABLET BY MOUTH EVERY DAY 90 tablet 0  . gabapentin (NEURONTIN) 300 MG capsule Take 300 mg by mouth as needed.     Marland Kitchen HYDROcodone-acetaminophen (NORCO/VICODIN) 5-325 MG per tablet Take 1 tablet by mouth every 6 (six) hours as needed (pain).     . Insulin Syringe-Needle U-100 (INSULIN SYRINGE 1CC/31GX5/16") 31G X 5/16" 1 ML MISC   0  . Lancets (ACCU-CHEK MULTICLIX) lancets   11  . Methotrexate Sodium, PF, 250 MG/10ML SOLN Inject 0.8 mLs as directed every 7 (seven) days. Thursdays    . Multiple Vitamin (MULTIVITAMIN WITH MINERALS) TABS Take 1 tablet by mouth daily.    Marland Kitchen NOVOLIN 70/30 RELION (70-30) 100 UNIT/ML injection Inject 25-40 Units into the skin 2 (two) times daily with a meal. 40units in the morning and 25units in the evening    . oxyCODONE-acetaminophen (ROXICET) 5-325 MG per tablet Take 1 tablet by mouth every 4 (four) hours as needed. 30 tablet 0  . potassium chloride SA (K-DUR,KLOR-CON) 20 MEQ tablet Take 1 tablet (20 mEq total) by mouth daily. 30 tablet 6  . predniSONE (DELTASONE) 5 MG tablet Take 5 mg by mouth daily.     . sertraline (ZOLOFT) 100 MG tablet Take 100 mg by mouth at bedtime.   1  . traZODone (DESYREL) 50 MG tablet Take 50 mg by mouth at bedtime.     No current facility-administered medications for this encounter.    Allergies:   Benicar; Latex; Other; and Tikosyn   Social History:  The patient  reports that she quit smoking about 21 years ago. She has never used smokeless tobacco. She reports that she does not drink alcohol or use illicit drugs.   Family History:  The patient's  family history includes Diabetes type II in her father; Heart attack in her maternal grandfather, maternal grandmother, mother, and paternal grandmother.    ROS:  Please see the history of present illness.    All other systems are reviewed and negative.    PHYSICAL EXAM: VS:  BP 140/70 mmHg  Pulse 56  Ht 5\' 3"  (1.6 m)  Wt 211 lb (95.709 kg)  BMI 37.39 kg/m2 , BMI Body mass index is 37.39 kg/(m^2). GEN: Well nourished, well developed, in no acute distress HEENT: normal Neck: no JVD, carotid bruits, or masses Cardiac: RRR; no murmurs, rubs, or gallops,no edema  Respiratory:  clear to auscultation bilaterally,few crackles rt lung base GI: soft, nontender, nondistended, + BS MS: no deformity or atrophy Skin: warm and dry  Neuro:  Strength and sensation are intact Psych: euthymic mood, full affect  EKG:  EKG  today shows sinus brady at 56 bpm, pr int 194 ms, qrs int 124 ms, qtc 474 ms RBBB    Lipid Panel     Wt Readings from Last 3 Encounters:  08/30/15 211 lb (95.709 kg)  05/30/15 208 lb 12.8 oz (94.711 kg)  02/08/15 185 lb 6.4 oz (84.097 kg)      ASSESSMENT AND PLAN:  1.  Atrial fibrillation Maintaining SR on amiodarone  Continue apixaban 5 mg bid   Liver panel just checked with rheumatologist and pt reported that she was told labs ok Will try to obtain from rheumatologist TSH today  2. HTN stable  Avoid salt  3. Weight gain Weight up 26 lbs since 02/08/15, she contributes to prednisone use Few crackles in rt base, with shortness of breath and amiodarone use, chest xray today Shortness of breath may be from weight gain   Chest xray today shows questionable kerley b lines and possible interstitial edema Pt has been instructed to increase lasix from 40 mg qd to 60 mg bid x 3 days Avoid salt Try to modify diet for weight loss  Will see back in 3 months   Butch Penny C. Ravonda Brecheen, [facility] [address] [place], [place] [address] [phone]

## 2015-09-04 DIAGNOSIS — E1165 Type 2 diabetes mellitus with hyperglycemia: Secondary | ICD-10-CM | POA: Diagnosis not present

## 2015-09-05 DIAGNOSIS — E0590 Thyrotoxicosis, unspecified without thyrotoxic crisis or storm: Secondary | ICD-10-CM | POA: Diagnosis not present

## 2015-09-28 DIAGNOSIS — E113413 Type 2 diabetes mellitus with severe nonproliferative diabetic retinopathy with macular edema, bilateral: Secondary | ICD-10-CM | POA: Diagnosis not present

## 2015-10-11 DIAGNOSIS — E0590 Thyrotoxicosis, unspecified without thyrotoxic crisis or storm: Secondary | ICD-10-CM | POA: Diagnosis not present

## 2015-10-17 ENCOUNTER — Encounter (INDEPENDENT_AMBULATORY_CARE_PROVIDER_SITE_OTHER): Payer: PPO | Admitting: Ophthalmology

## 2015-10-17 DIAGNOSIS — E113312 Type 2 diabetes mellitus with moderate nonproliferative diabetic retinopathy with macular edema, left eye: Secondary | ICD-10-CM

## 2015-10-17 DIAGNOSIS — I10 Essential (primary) hypertension: Secondary | ICD-10-CM | POA: Diagnosis not present

## 2015-10-17 DIAGNOSIS — H43813 Vitreous degeneration, bilateral: Secondary | ICD-10-CM

## 2015-10-17 DIAGNOSIS — E11311 Type 2 diabetes mellitus with unspecified diabetic retinopathy with macular edema: Secondary | ICD-10-CM

## 2015-10-17 DIAGNOSIS — E113211 Type 2 diabetes mellitus with mild nonproliferative diabetic retinopathy with macular edema, right eye: Secondary | ICD-10-CM | POA: Diagnosis not present

## 2015-10-17 DIAGNOSIS — H35033 Hypertensive retinopathy, bilateral: Secondary | ICD-10-CM | POA: Diagnosis not present

## 2015-10-29 ENCOUNTER — Other Ambulatory Visit: Payer: Self-pay | Admitting: Cardiovascular Disease

## 2015-10-29 NOTE — Telephone Encounter (Signed)
REFILL 

## 2015-11-05 ENCOUNTER — Other Ambulatory Visit: Payer: Self-pay | Admitting: Internal Medicine

## 2015-11-14 ENCOUNTER — Encounter (INDEPENDENT_AMBULATORY_CARE_PROVIDER_SITE_OTHER): Payer: PPO | Admitting: Ophthalmology

## 2015-11-14 DIAGNOSIS — E113313 Type 2 diabetes mellitus with moderate nonproliferative diabetic retinopathy with macular edema, bilateral: Secondary | ICD-10-CM | POA: Diagnosis not present

## 2015-11-14 DIAGNOSIS — H35033 Hypertensive retinopathy, bilateral: Secondary | ICD-10-CM | POA: Diagnosis not present

## 2015-11-14 DIAGNOSIS — I10 Essential (primary) hypertension: Secondary | ICD-10-CM | POA: Diagnosis not present

## 2015-11-14 DIAGNOSIS — E11311 Type 2 diabetes mellitus with unspecified diabetic retinopathy with macular edema: Secondary | ICD-10-CM

## 2015-11-14 DIAGNOSIS — H43813 Vitreous degeneration, bilateral: Secondary | ICD-10-CM | POA: Diagnosis not present

## 2015-11-16 ENCOUNTER — Other Ambulatory Visit (INDEPENDENT_AMBULATORY_CARE_PROVIDER_SITE_OTHER): Payer: PPO | Admitting: Ophthalmology

## 2015-11-16 DIAGNOSIS — E113211 Type 2 diabetes mellitus with mild nonproliferative diabetic retinopathy with macular edema, right eye: Secondary | ICD-10-CM | POA: Diagnosis not present

## 2015-11-16 DIAGNOSIS — E11311 Type 2 diabetes mellitus with unspecified diabetic retinopathy with macular edema: Secondary | ICD-10-CM | POA: Diagnosis not present

## 2015-11-26 IMAGING — CR DG CHEST 1V
1 series · 1 of 1 positions shown · non-contrast
Comparison: 01/06/2013; 08/25/2012

CLINICAL DATA: Post fall.  Initial encounter.

EXAM:
CHEST - 1 VIEW

[t chest supine]
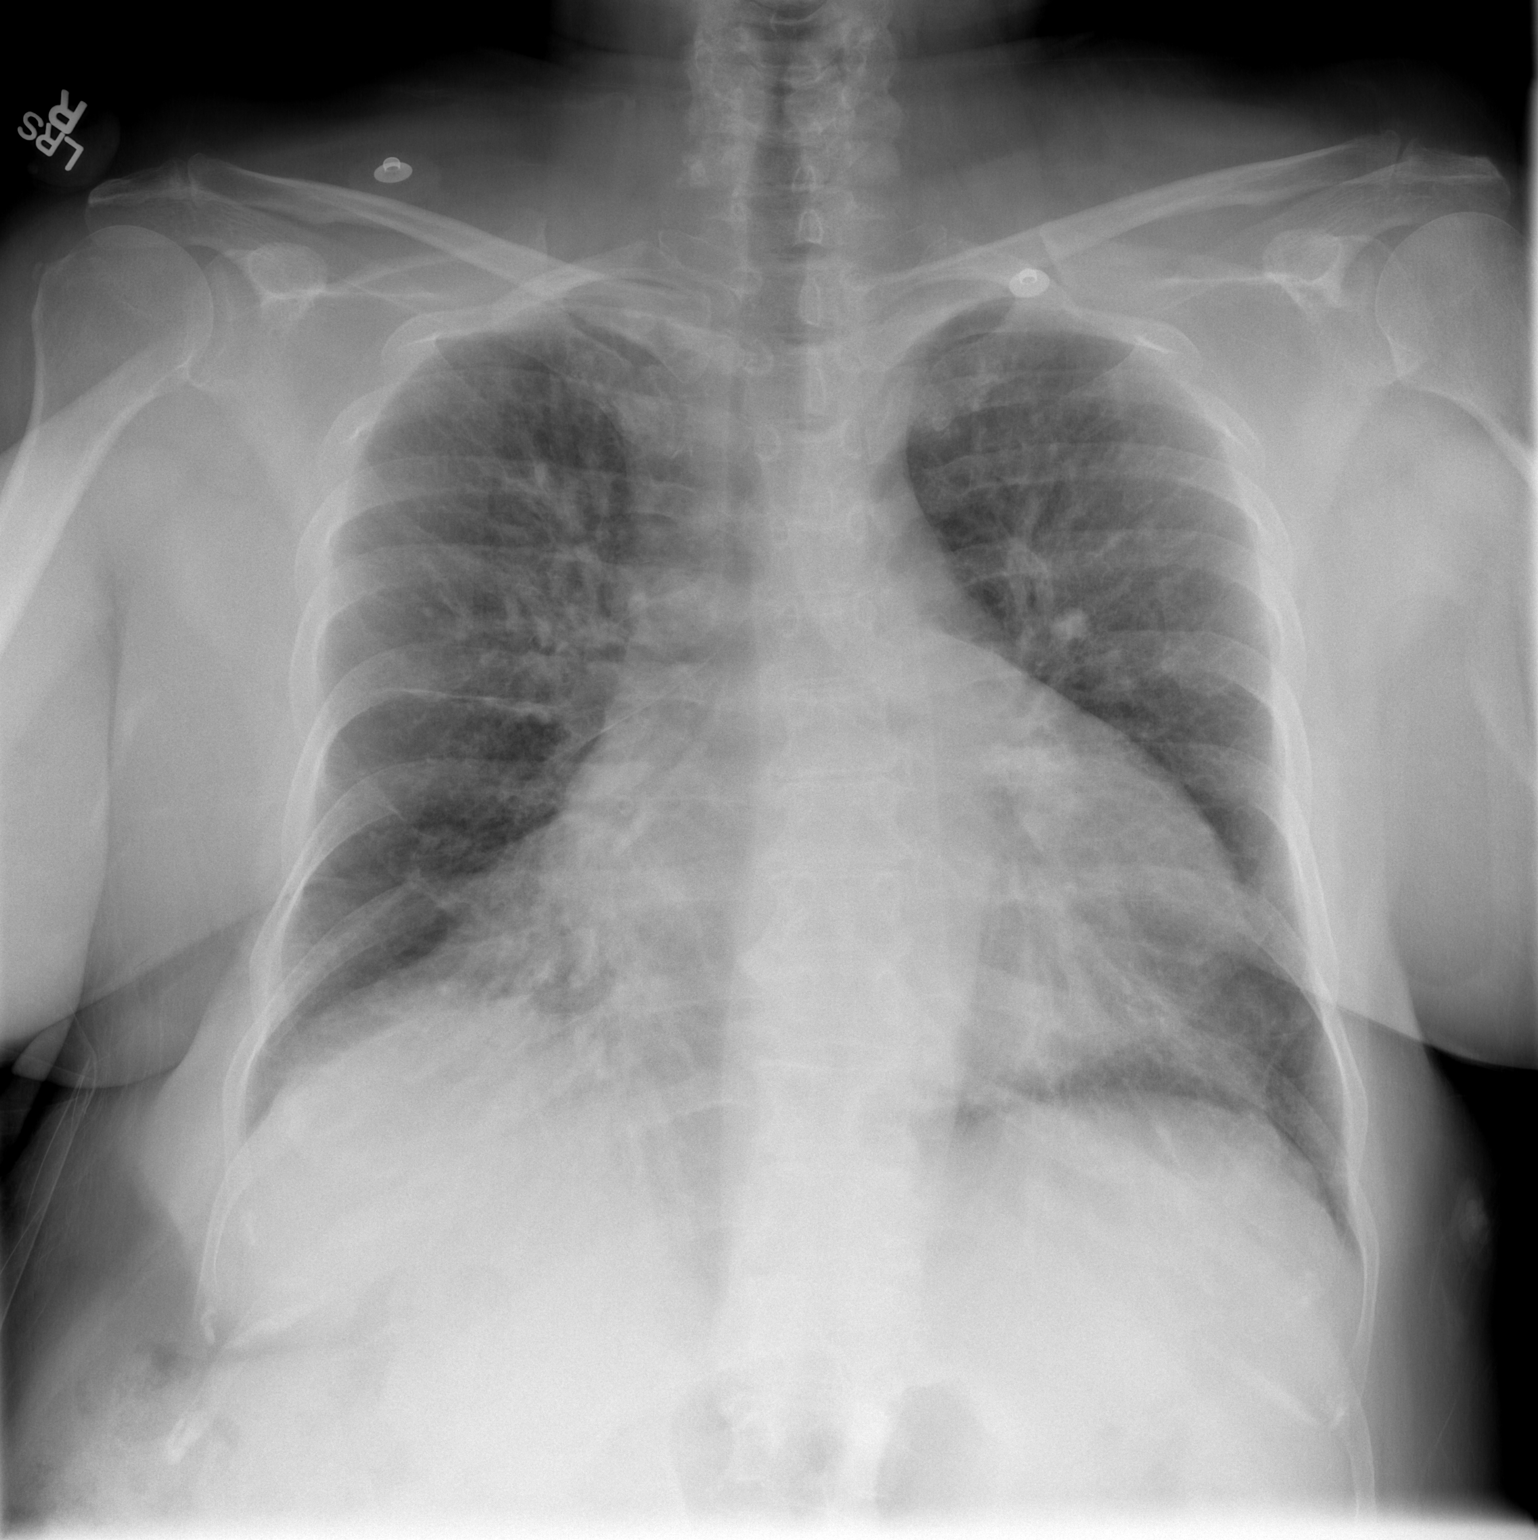

[1 of 1 positions shown; findings below may reference images not displayed]

FINDINGS: Grossly unchanged enlarged cardiac silhouette and mediastinal
contours. Bilateral medial basilar heterogeneous opacities are
unchanged, right greater than left. Mild pulmonary venous congestion
without frank evidence of edema. No pleural effusion pneumothorax.
No acute osseus abnormalities.
IMPRESSION: 1. Cardiomegaly and pulmonary venous congestion without definite
acute cardiopulmonary disease.
2. Grossly unchanged bilateral medial basilar opacities, right
greater than left, likely atelectasis.

## 2015-11-26 IMAGING — CR DG HIP COMPLETE 2+V*R*
3 series · 3 of 3 positions shown · non-contrast
Comparison: None.

CLINICAL DATA: Post fall attempting to sit on bed, now with right
hip pain. Initial encounter.

EXAM:
RIGHT HIP - COMPLETE 2+ VIEW

[t pelvis a.p.]
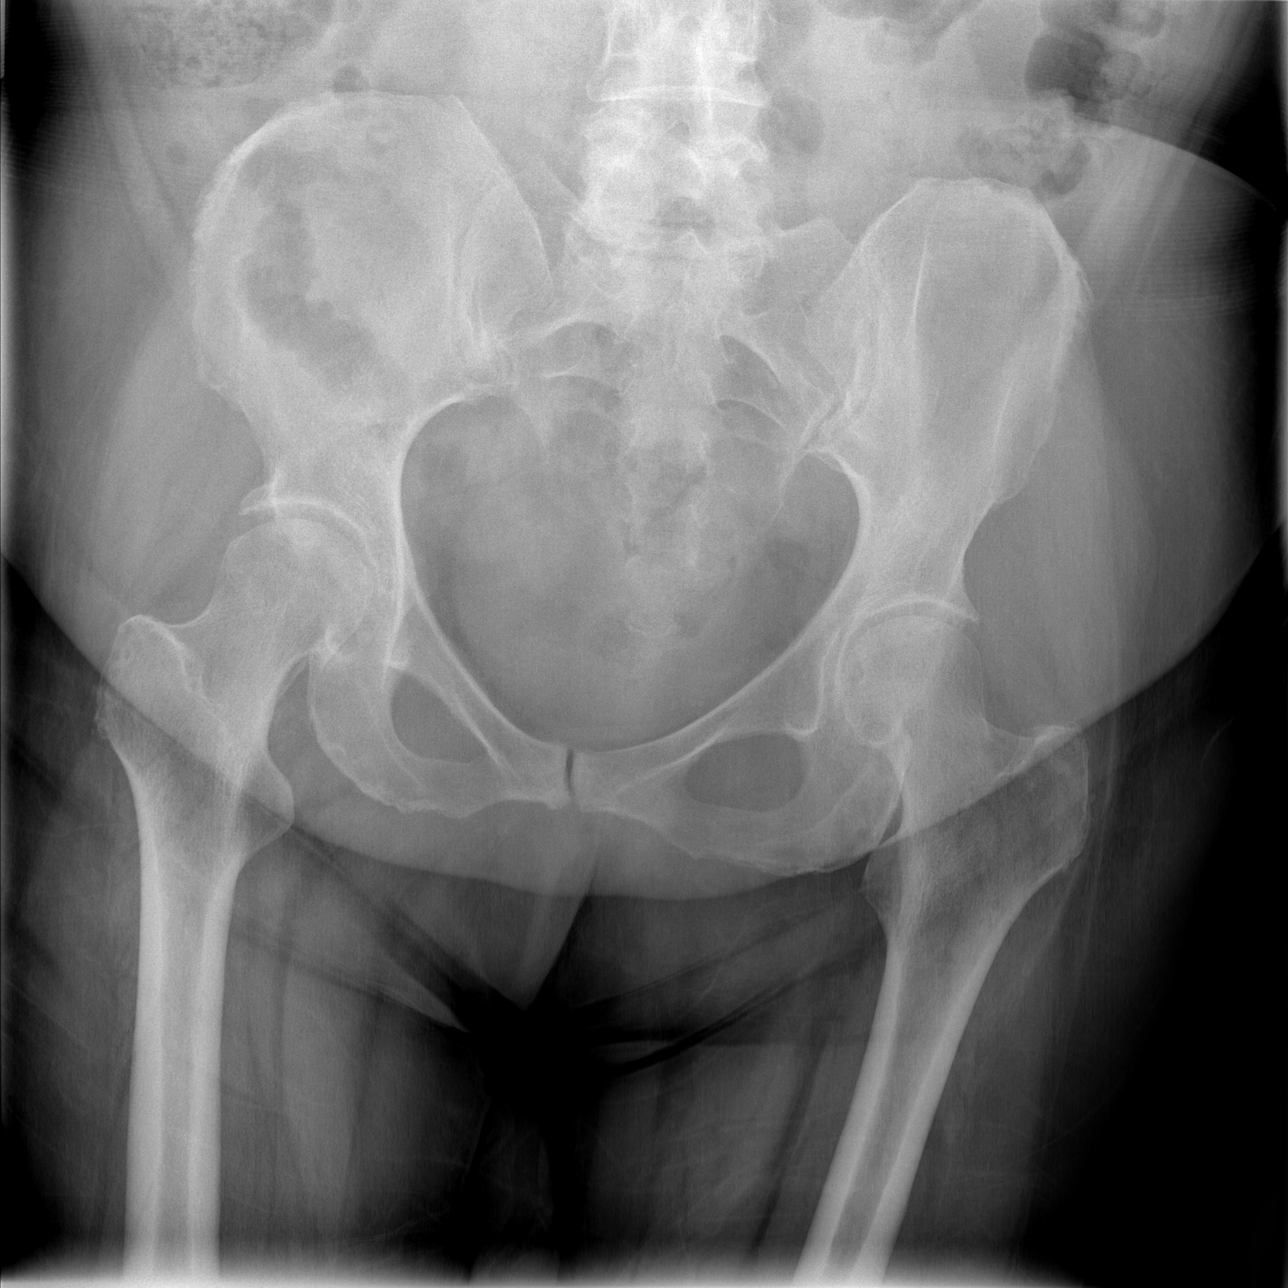

[t hip ap right]
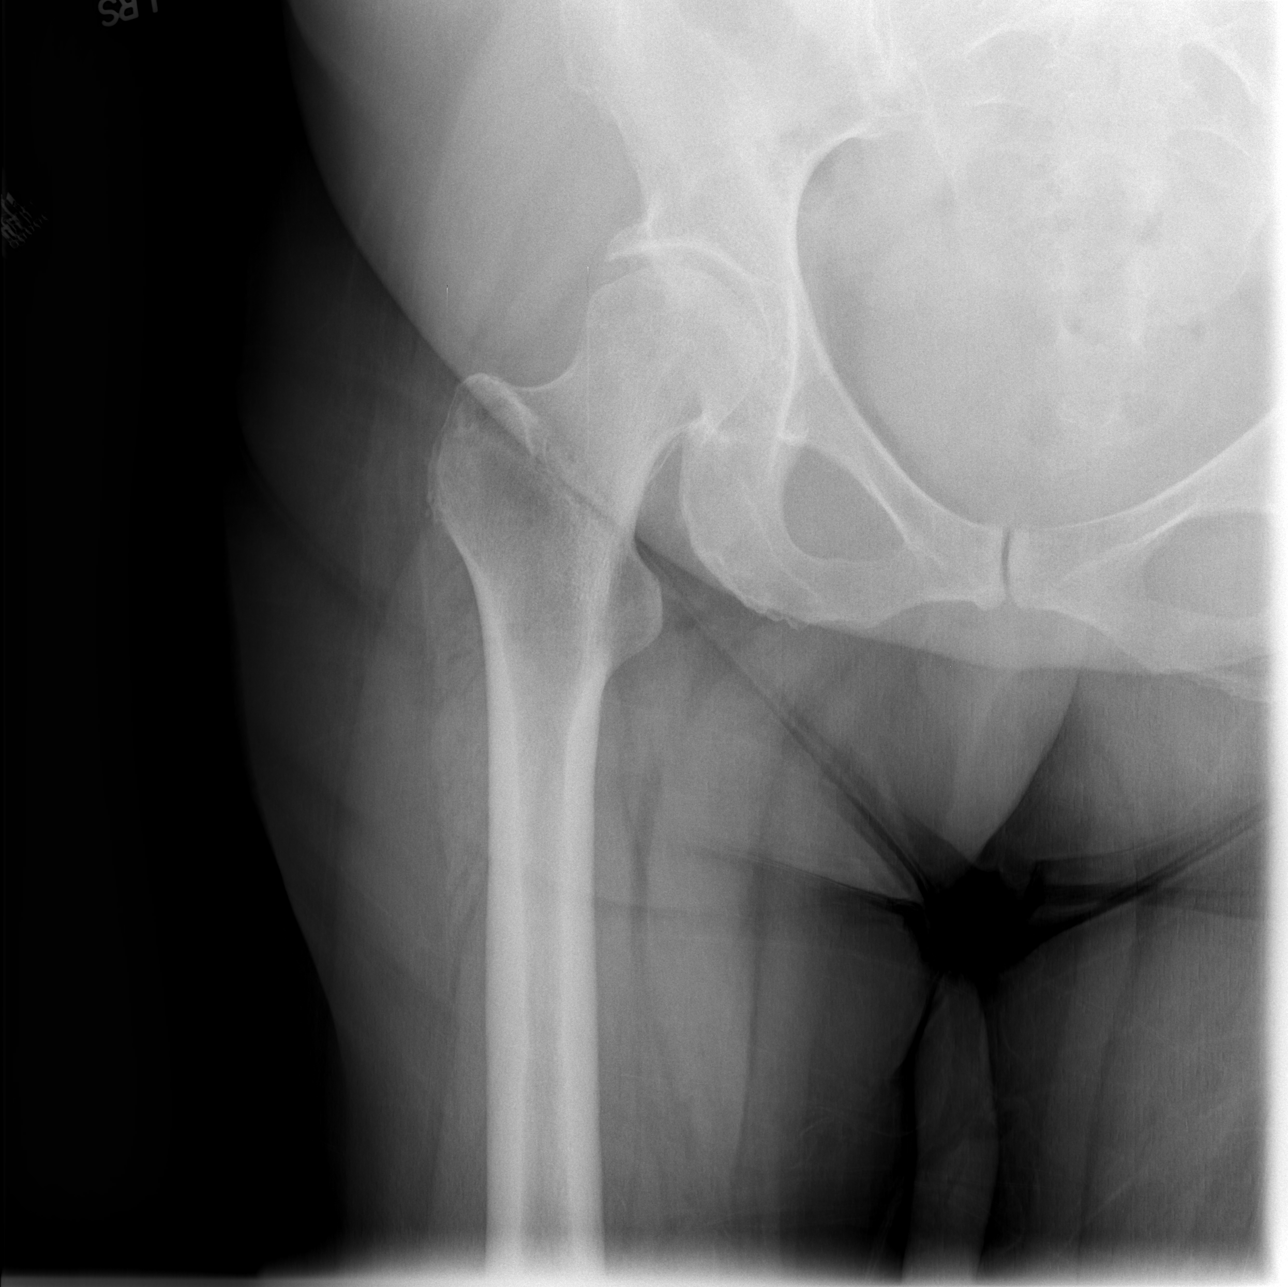

[t hip frog leg right]
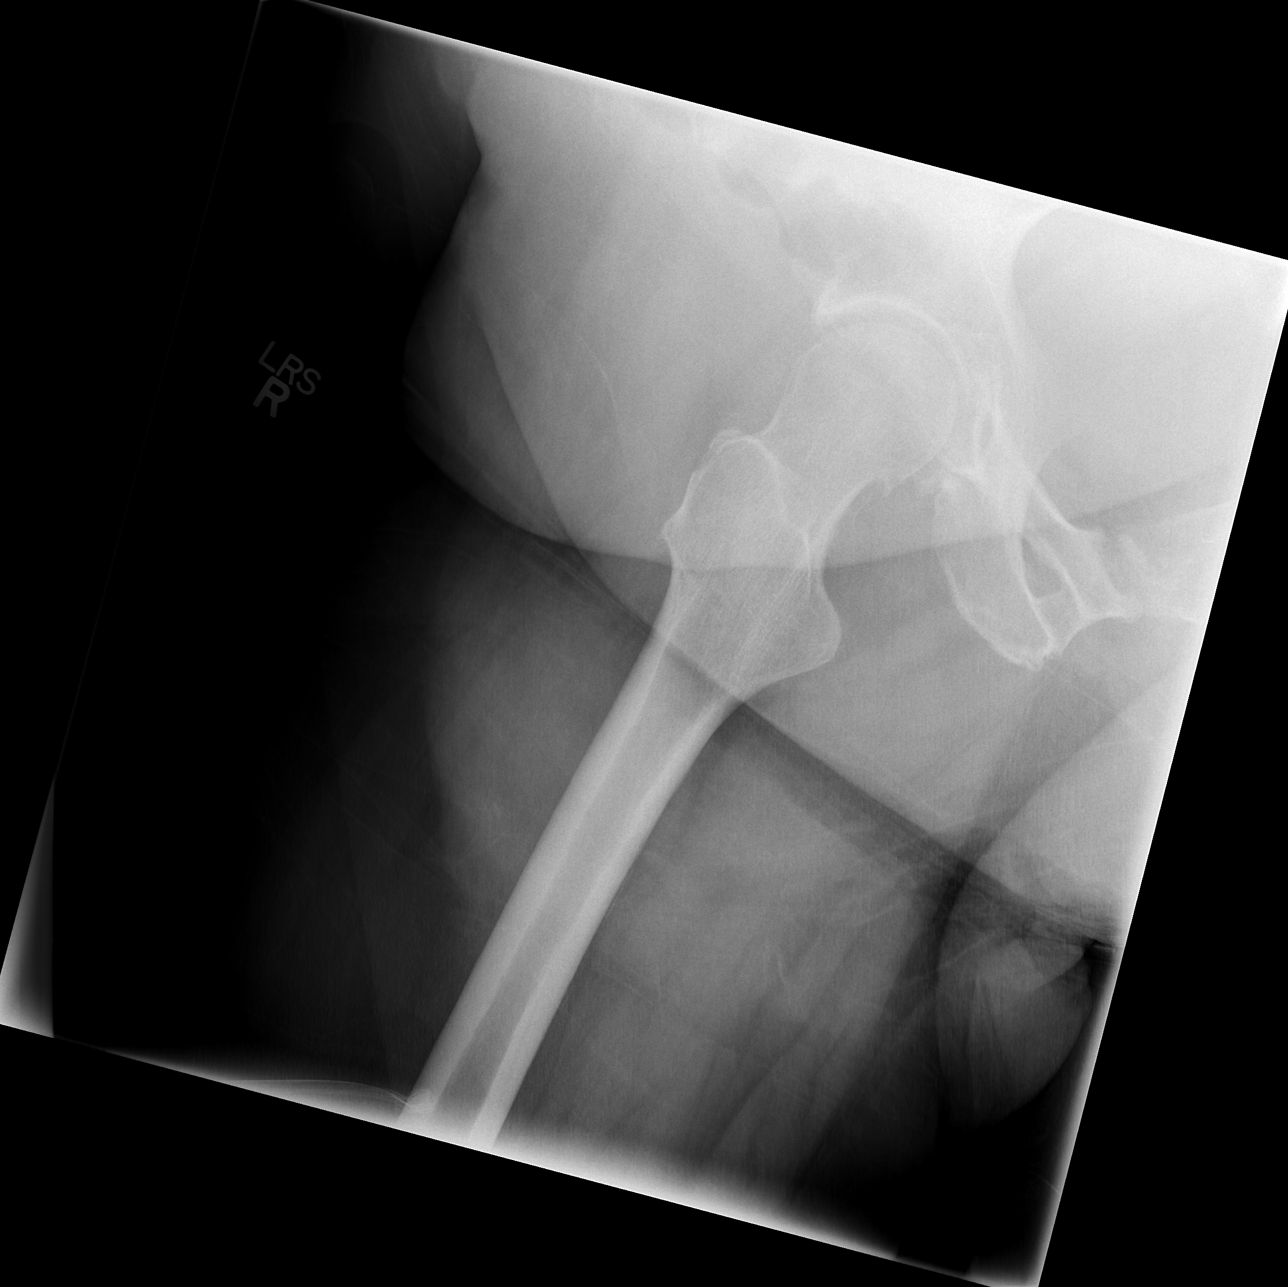

[3 of 3 positions shown; findings below may reference images not displayed]

FINDINGS: No definite displaced right-sided hip fracture. Moderate
degenerative change the right hip with joint space loss, subchondral
sclerosis osteophytosis. No evidence of avascular necrosis. Regional
soft tissues appear normal. No radiopaque foreign body.
IMPRESSION: 1. No acute findings.
2. Moderate degenerative change of the right hip.

## 2015-12-05 DIAGNOSIS — E1165 Type 2 diabetes mellitus with hyperglycemia: Secondary | ICD-10-CM | POA: Diagnosis not present

## 2015-12-11 ENCOUNTER — Encounter (INDEPENDENT_AMBULATORY_CARE_PROVIDER_SITE_OTHER): Payer: PPO | Admitting: Ophthalmology

## 2015-12-11 DIAGNOSIS — H35033 Hypertensive retinopathy, bilateral: Secondary | ICD-10-CM | POA: Diagnosis not present

## 2015-12-11 DIAGNOSIS — H43813 Vitreous degeneration, bilateral: Secondary | ICD-10-CM

## 2015-12-11 DIAGNOSIS — I10 Essential (primary) hypertension: Secondary | ICD-10-CM

## 2015-12-11 DIAGNOSIS — E11311 Type 2 diabetes mellitus with unspecified diabetic retinopathy with macular edema: Secondary | ICD-10-CM | POA: Diagnosis not present

## 2015-12-11 DIAGNOSIS — E113313 Type 2 diabetes mellitus with moderate nonproliferative diabetic retinopathy with macular edema, bilateral: Secondary | ICD-10-CM | POA: Diagnosis not present

## 2015-12-13 ENCOUNTER — Encounter: Payer: Self-pay | Admitting: Nurse Practitioner

## 2016-01-08 ENCOUNTER — Encounter (INDEPENDENT_AMBULATORY_CARE_PROVIDER_SITE_OTHER): Payer: PPO | Admitting: Ophthalmology

## 2016-01-08 DIAGNOSIS — I10 Essential (primary) hypertension: Secondary | ICD-10-CM

## 2016-01-08 DIAGNOSIS — E113291 Type 2 diabetes mellitus with mild nonproliferative diabetic retinopathy without macular edema, right eye: Secondary | ICD-10-CM

## 2016-01-08 DIAGNOSIS — E11311 Type 2 diabetes mellitus with unspecified diabetic retinopathy with macular edema: Secondary | ICD-10-CM

## 2016-01-08 DIAGNOSIS — H43813 Vitreous degeneration, bilateral: Secondary | ICD-10-CM

## 2016-01-08 DIAGNOSIS — E113312 Type 2 diabetes mellitus with moderate nonproliferative diabetic retinopathy with macular edema, left eye: Secondary | ICD-10-CM

## 2016-01-08 DIAGNOSIS — H35033 Hypertensive retinopathy, bilateral: Secondary | ICD-10-CM

## 2016-01-17 ENCOUNTER — Other Ambulatory Visit (INDEPENDENT_AMBULATORY_CARE_PROVIDER_SITE_OTHER): Payer: PPO | Admitting: Ophthalmology

## 2016-01-17 DIAGNOSIS — E113312 Type 2 diabetes mellitus with moderate nonproliferative diabetic retinopathy with macular edema, left eye: Secondary | ICD-10-CM

## 2016-01-17 DIAGNOSIS — E11311 Type 2 diabetes mellitus with unspecified diabetic retinopathy with macular edema: Secondary | ICD-10-CM | POA: Diagnosis not present

## 2016-01-21 ENCOUNTER — Other Ambulatory Visit: Payer: Self-pay | Admitting: Cardiovascular Disease

## 2016-01-23 DIAGNOSIS — I10 Essential (primary) hypertension: Secondary | ICD-10-CM | POA: Diagnosis not present

## 2016-01-23 DIAGNOSIS — E7800 Pure hypercholesterolemia, unspecified: Secondary | ICD-10-CM | POA: Diagnosis not present

## 2016-01-23 DIAGNOSIS — Z0000 Encounter for general adult medical examination without abnormal findings: Secondary | ICD-10-CM | POA: Diagnosis not present

## 2016-01-23 DIAGNOSIS — E1165 Type 2 diabetes mellitus with hyperglycemia: Secondary | ICD-10-CM | POA: Diagnosis not present

## 2016-01-26 ENCOUNTER — Other Ambulatory Visit: Payer: Self-pay | Admitting: Internal Medicine

## 2016-02-04 DIAGNOSIS — E0590 Thyrotoxicosis, unspecified without thyrotoxic crisis or storm: Secondary | ICD-10-CM | POA: Diagnosis not present

## 2016-02-04 DIAGNOSIS — G609 Hereditary and idiopathic neuropathy, unspecified: Secondary | ICD-10-CM | POA: Diagnosis not present

## 2016-02-04 DIAGNOSIS — E119 Type 2 diabetes mellitus without complications: Secondary | ICD-10-CM | POA: Diagnosis not present

## 2016-02-04 DIAGNOSIS — I10 Essential (primary) hypertension: Secondary | ICD-10-CM | POA: Diagnosis not present

## 2016-02-12 DIAGNOSIS — R269 Unspecified abnormalities of gait and mobility: Secondary | ICD-10-CM | POA: Diagnosis not present

## 2016-02-12 DIAGNOSIS — M79642 Pain in left hand: Secondary | ICD-10-CM | POA: Diagnosis not present

## 2016-02-12 DIAGNOSIS — Z0000 Encounter for general adult medical examination without abnormal findings: Secondary | ICD-10-CM | POA: Diagnosis not present

## 2016-02-12 DIAGNOSIS — M0579 Rheumatoid arthritis with rheumatoid factor of multiple sites without organ or systems involvement: Secondary | ICD-10-CM | POA: Diagnosis not present

## 2016-02-19 DIAGNOSIS — M0579 Rheumatoid arthritis with rheumatoid factor of multiple sites without organ or systems involvement: Secondary | ICD-10-CM | POA: Diagnosis not present

## 2016-02-19 DIAGNOSIS — Z79899 Other long term (current) drug therapy: Secondary | ICD-10-CM | POA: Diagnosis not present

## 2016-02-25 DIAGNOSIS — R269 Unspecified abnormalities of gait and mobility: Secondary | ICD-10-CM | POA: Diagnosis not present

## 2016-03-06 DIAGNOSIS — E1165 Type 2 diabetes mellitus with hyperglycemia: Secondary | ICD-10-CM | POA: Diagnosis not present

## 2016-03-18 ENCOUNTER — Encounter (INDEPENDENT_AMBULATORY_CARE_PROVIDER_SITE_OTHER): Payer: PPO | Admitting: Ophthalmology

## 2016-03-25 DIAGNOSIS — I10 Essential (primary) hypertension: Secondary | ICD-10-CM | POA: Diagnosis not present

## 2016-03-25 DIAGNOSIS — Z23 Encounter for immunization: Secondary | ICD-10-CM | POA: Diagnosis not present

## 2016-03-25 DIAGNOSIS — E039 Hypothyroidism, unspecified: Secondary | ICD-10-CM | POA: Diagnosis not present

## 2016-03-26 ENCOUNTER — Encounter (INDEPENDENT_AMBULATORY_CARE_PROVIDER_SITE_OTHER): Payer: PPO | Admitting: Ophthalmology

## 2016-03-26 DIAGNOSIS — I10 Essential (primary) hypertension: Secondary | ICD-10-CM

## 2016-03-26 DIAGNOSIS — H43813 Vitreous degeneration, bilateral: Secondary | ICD-10-CM

## 2016-03-26 DIAGNOSIS — E113313 Type 2 diabetes mellitus with moderate nonproliferative diabetic retinopathy with macular edema, bilateral: Secondary | ICD-10-CM | POA: Diagnosis not present

## 2016-03-26 DIAGNOSIS — H35033 Hypertensive retinopathy, bilateral: Secondary | ICD-10-CM | POA: Diagnosis not present

## 2016-03-26 DIAGNOSIS — E11311 Type 2 diabetes mellitus with unspecified diabetic retinopathy with macular edema: Secondary | ICD-10-CM | POA: Diagnosis not present

## 2016-04-23 DIAGNOSIS — E0590 Thyrotoxicosis, unspecified without thyrotoxic crisis or storm: Secondary | ICD-10-CM | POA: Diagnosis not present

## 2016-05-01 ENCOUNTER — Other Ambulatory Visit: Payer: Self-pay | Admitting: Cardiovascular Disease

## 2016-05-01 ENCOUNTER — Other Ambulatory Visit: Payer: Self-pay

## 2016-05-01 MED ORDER — FUROSEMIDE 40 MG PO TABS: 40.0000 mg | ORAL_TABLET | Freq: Every day | ORAL | 0 refills | Status: DC

## 2016-05-13 ENCOUNTER — Other Ambulatory Visit: Payer: Self-pay | Admitting: Internal Medicine

## 2016-05-22 ENCOUNTER — Encounter: Payer: Self-pay | Admitting: Gastroenterology

## 2016-05-26 ENCOUNTER — Other Ambulatory Visit: Payer: Self-pay | Admitting: Cardiovascular Disease

## 2016-05-26 NOTE — Telephone Encounter (Signed)
Rx request sent to pharmacy.  

## 2016-05-28 DIAGNOSIS — J449 Chronic obstructive pulmonary disease, unspecified: Secondary | ICD-10-CM | POA: Diagnosis not present

## 2016-06-03 ENCOUNTER — Encounter (INDEPENDENT_AMBULATORY_CARE_PROVIDER_SITE_OTHER): Payer: PPO | Admitting: Ophthalmology

## 2016-06-03 ENCOUNTER — Encounter: Payer: Self-pay | Admitting: Gastroenterology

## 2016-06-03 DIAGNOSIS — H43813 Vitreous degeneration, bilateral: Secondary | ICD-10-CM

## 2016-06-03 DIAGNOSIS — E113312 Type 2 diabetes mellitus with moderate nonproliferative diabetic retinopathy with macular edema, left eye: Secondary | ICD-10-CM | POA: Diagnosis not present

## 2016-06-03 DIAGNOSIS — E113391 Type 2 diabetes mellitus with moderate nonproliferative diabetic retinopathy without macular edema, right eye: Secondary | ICD-10-CM

## 2016-06-03 DIAGNOSIS — E11311 Type 2 diabetes mellitus with unspecified diabetic retinopathy with macular edema: Secondary | ICD-10-CM

## 2016-06-03 DIAGNOSIS — I10 Essential (primary) hypertension: Secondary | ICD-10-CM

## 2016-06-03 DIAGNOSIS — H35033 Hypertensive retinopathy, bilateral: Secondary | ICD-10-CM

## 2016-06-28 ENCOUNTER — Other Ambulatory Visit: Payer: Self-pay | Admitting: Cardiovascular Disease

## 2016-06-30 NOTE — Telephone Encounter (Signed)
Rx(s) sent to pharmacy electronically.  

## 2016-07-28 ENCOUNTER — Other Ambulatory Visit (INDEPENDENT_AMBULATORY_CARE_PROVIDER_SITE_OTHER): Payer: PPO | Admitting: Ophthalmology

## 2016-07-28 DIAGNOSIS — H26491 Other secondary cataract, right eye: Secondary | ICD-10-CM

## 2016-08-12 ENCOUNTER — Encounter (INDEPENDENT_AMBULATORY_CARE_PROVIDER_SITE_OTHER): Payer: PPO | Admitting: Ophthalmology

## 2016-08-18 ENCOUNTER — Other Ambulatory Visit: Payer: Self-pay | Admitting: Internal Medicine

## 2016-08-18 NOTE — Telephone Encounter (Signed)
Rx refill sent to pharmacy. 

## 2016-08-20 ENCOUNTER — Other Ambulatory Visit: Payer: Self-pay | Admitting: Internal Medicine

## 2016-08-20 NOTE — Telephone Encounter (Signed)
Sent in pt Eliquis refill. Her labs from [facility] on 08/28/15 that are scanned into the chart are Crea-1.32, Hb-11.9, Hct-36.8, Wt-95.7kg, kept the pt on Eliquis 5mg  BID but the pt will need an appt with Cardiology before she has obtains more refills of Eliquis.

## 2016-08-25 ENCOUNTER — Encounter (INDEPENDENT_AMBULATORY_CARE_PROVIDER_SITE_OTHER): Payer: PPO | Admitting: Ophthalmology

## 2016-09-04 ENCOUNTER — Encounter (INDEPENDENT_AMBULATORY_CARE_PROVIDER_SITE_OTHER): Payer: PPO | Admitting: Ophthalmology

## 2016-09-04 DIAGNOSIS — I10 Essential (primary) hypertension: Secondary | ICD-10-CM | POA: Diagnosis not present

## 2016-09-04 DIAGNOSIS — E113391 Type 2 diabetes mellitus with moderate nonproliferative diabetic retinopathy without macular edema, right eye: Secondary | ICD-10-CM | POA: Diagnosis not present

## 2016-09-04 DIAGNOSIS — H43813 Vitreous degeneration, bilateral: Secondary | ICD-10-CM

## 2016-09-04 DIAGNOSIS — E11311 Type 2 diabetes mellitus with unspecified diabetic retinopathy with macular edema: Secondary | ICD-10-CM

## 2016-09-04 DIAGNOSIS — E113312 Type 2 diabetes mellitus with moderate nonproliferative diabetic retinopathy with macular edema, left eye: Secondary | ICD-10-CM | POA: Diagnosis not present

## 2016-09-04 DIAGNOSIS — H35033 Hypertensive retinopathy, bilateral: Secondary | ICD-10-CM

## 2016-11-13 ENCOUNTER — Other Ambulatory Visit: Payer: Self-pay | Admitting: Internal Medicine

## 2016-11-13 ENCOUNTER — Encounter (INDEPENDENT_AMBULATORY_CARE_PROVIDER_SITE_OTHER): Payer: PPO | Admitting: Ophthalmology

## 2016-11-13 DIAGNOSIS — H43813 Vitreous degeneration, bilateral: Secondary | ICD-10-CM | POA: Diagnosis not present

## 2016-11-13 DIAGNOSIS — E113391 Type 2 diabetes mellitus with moderate nonproliferative diabetic retinopathy without macular edema, right eye: Secondary | ICD-10-CM

## 2016-11-13 DIAGNOSIS — I10 Essential (primary) hypertension: Secondary | ICD-10-CM

## 2016-11-13 DIAGNOSIS — E11311 Type 2 diabetes mellitus with unspecified diabetic retinopathy with macular edema: Secondary | ICD-10-CM | POA: Diagnosis not present

## 2016-11-13 DIAGNOSIS — E113312 Type 2 diabetes mellitus with moderate nonproliferative diabetic retinopathy with macular edema, left eye: Secondary | ICD-10-CM | POA: Diagnosis not present

## 2016-11-13 DIAGNOSIS — H35033 Hypertensive retinopathy, bilateral: Secondary | ICD-10-CM

## 2016-11-17 ENCOUNTER — Other Ambulatory Visit: Payer: Self-pay | Admitting: Internal Medicine

## 2016-12-12 DIAGNOSIS — N183 Chronic kidney disease, stage 3 (moderate): Secondary | ICD-10-CM | POA: Diagnosis not present

## 2016-12-12 DIAGNOSIS — I10 Essential (primary) hypertension: Secondary | ICD-10-CM | POA: Diagnosis not present

## 2016-12-12 DIAGNOSIS — E1122 Type 2 diabetes mellitus with diabetic chronic kidney disease: Secondary | ICD-10-CM | POA: Diagnosis not present

## 2016-12-12 DIAGNOSIS — E1165 Type 2 diabetes mellitus with hyperglycemia: Secondary | ICD-10-CM | POA: Diagnosis not present

## 2016-12-12 DIAGNOSIS — G609 Hereditary and idiopathic neuropathy, unspecified: Secondary | ICD-10-CM | POA: Diagnosis not present

## 2016-12-12 DIAGNOSIS — E0590 Thyrotoxicosis, unspecified without thyrotoxic crisis or storm: Secondary | ICD-10-CM | POA: Diagnosis not present

## 2016-12-17 ENCOUNTER — Other Ambulatory Visit (HOSPITAL_COMMUNITY): Payer: Self-pay | Admitting: Nurse Practitioner

## 2016-12-17 ENCOUNTER — Encounter (HOSPITAL_COMMUNITY): Payer: Self-pay | Admitting: Nurse Practitioner

## 2016-12-17 ENCOUNTER — Ambulatory Visit (HOSPITAL_COMMUNITY)
Admission: RE | Admit: 2016-12-17 | Discharge: 2016-12-17 | Disposition: A | Discharge status: Home / Self Care | Payer: PPO | Source: Ambulatory Visit | Attending: Nurse Practitioner | Admitting: Nurse Practitioner

## 2016-12-17 ENCOUNTER — Other Ambulatory Visit (HOSPITAL_COMMUNITY): Payer: Self-pay | Admitting: *Deleted

## 2016-12-17 VITALS — BP 118/62 | HR 88 | Ht 63.0 in | Wt 219.0 lb

## 2016-12-17 DIAGNOSIS — I4510 Unspecified right bundle-branch block: Secondary | ICD-10-CM | POA: Diagnosis not present

## 2016-12-17 DIAGNOSIS — Z7901 Long term (current) use of anticoagulants: Secondary | ICD-10-CM | POA: Diagnosis not present

## 2016-12-17 DIAGNOSIS — E119 Type 2 diabetes mellitus without complications: Secondary | ICD-10-CM | POA: Diagnosis not present

## 2016-12-17 DIAGNOSIS — I10 Essential (primary) hypertension: Secondary | ICD-10-CM | POA: Insufficient documentation

## 2016-12-17 DIAGNOSIS — K219 Gastro-esophageal reflux disease without esophagitis: Secondary | ICD-10-CM | POA: Insufficient documentation

## 2016-12-17 DIAGNOSIS — F329 Major depressive disorder, single episode, unspecified: Secondary | ICD-10-CM | POA: Insufficient documentation

## 2016-12-17 DIAGNOSIS — Z79899 Other long term (current) drug therapy: Secondary | ICD-10-CM | POA: Diagnosis not present

## 2016-12-17 DIAGNOSIS — Z794 Long term (current) use of insulin: Secondary | ICD-10-CM | POA: Insufficient documentation

## 2016-12-17 DIAGNOSIS — I481 Persistent atrial fibrillation: Secondary | ICD-10-CM | POA: Diagnosis not present

## 2016-12-17 DIAGNOSIS — E785 Hyperlipidemia, unspecified: Secondary | ICD-10-CM | POA: Insufficient documentation

## 2016-12-17 DIAGNOSIS — I4819 Other persistent atrial fibrillation: Secondary | ICD-10-CM

## 2016-12-17 DIAGNOSIS — Z6838 Body mass index (BMI) 38.0-38.9, adult: Secondary | ICD-10-CM | POA: Insufficient documentation

## 2016-12-17 DIAGNOSIS — E669 Obesity, unspecified: Secondary | ICD-10-CM | POA: Insufficient documentation

## 2016-12-17 DIAGNOSIS — Z87891 Personal history of nicotine dependence: Secondary | ICD-10-CM | POA: Diagnosis not present

## 2016-12-17 DIAGNOSIS — I4891 Unspecified atrial fibrillation: Secondary | ICD-10-CM | POA: Diagnosis present

## 2016-12-17 MED ORDER — AMIODARONE HCL 200 MG PO TABS: ORAL_TABLET | ORAL | 0 refills | Status: DC

## 2016-12-17 NOTE — Progress Notes (Signed)
[name], female   DOB: [birth year], 71 y.o.   MRN: [id number]       Date:  12/17/2016   PCP:  Alison Pretty, MD  Cardiologist:  Dr Alison Bell EP: Dr. [name] Primary Electrophysiologist: Alison Palau, NP    No chief complaint on file.    History of Present Illness: Alison Bell is a 71 y.o. female who presents today for f/u in the afib clinic.  She has not been seen since 08/30/15. She is unsure as to why she has not had f/u, she should have been back in spring of 2017.  She is in rate control afib today.Pharmacy was called pt has not had amiodarone filled x 18 months. She was unaware she was in afib today but husband and pt report that she is tired. Husband is unsure now if another medical provider may have stopped amiodarone. None of her physicians are in [place] so husband will call and find out. Otherwise, they are interested in her reloading on amiodarone to see if she can get back in SR to give her more energy.  Today, she denies symptoms of palpitations, chest pain,orthopnea, PND, lower extremity edema, claudication, dizziness, presyncope, syncope, bleeding, or neurologic sequela. The patient is tolerating medications without difficulties and is otherwise without complaint today.    Past Medical History:  Diagnosis Date  . Anemia   . Arthritis    rheumatoid  . Colon adenoma   . Depression   . Diabetes mellitus without complication ([place])   . Difficulty sleeping   . GERD (gastroesophageal reflux disease)   . Headache    migraines  . History of blood transfusion   . HTN (hypertension)   . Hyperlipidemia   . LV dysfunction, EF 45-50% due to a. fib per echo 08/27/12  08/28/2012   a. Echo (10/15):  EF 55-60%, no RWMA, Gr 2 DD, mild MR, mod LAE, normal RVF, mild RAE, mild TR (LA 44 mm)  . Persistent atrial fibrillation ([place])   . Torsades de pointes [facility])    due to [facility] 04/2014   Past Surgical History:  Procedure Laterality Date  . ATRIAL FIBRILLATION ABLATION N/A 11/02/2012    PVI by Dr [name]  . CARDIOVERSION N/A 09/06/2012   Procedure: CARDIOVERSION;  Surgeon: [name], MD;  Location: [facility] ENDOSCOPY;  Service: Cardiovascular;  Laterality: N/A;  . CARDIOVERSION N/A 01/07/2013   Procedure: CARDIOVERSION;  Surgeon: [name], MD;  Location: [place] ENDOSCOPY;  Service: Cardiovascular;  Laterality: N/A;  . CARDIOVERSION N/A 11/14/2013   Procedure: CARDIOVERSION;  Surgeon: [name], MD;  Location: [facility] ENDOSCOPY;  Service: Cardiovascular;  Laterality: N/A;  . COLONOSCOPY N/A 07/28/2014   Procedure: COLONOSCOPY;  Surgeon: Ladene Artist, MD;  Location: [facility] ENDOSCOPY;  Service: Endoscopy;  Laterality: N/A;  . LAPAROSCOPIC SIGMOID COLECTOMY N/A 08/08/2014   Procedure: LAPAROSCOPIC SIGMOID COLECTOMY Sylvie Farrier PROCTOSCOPY;  Surgeon: Excell Seltzer, MD;  Location: [facility] ORS;  Service: General;  Laterality: N/A;  . TEE WITHOUT CARDIOVERSION N/A 09/06/2012   Procedure: TRANSESOPHAGEAL ECHOCARDIOGRAM (TEE);  Surgeon: [name], MD;  Location: [facility] ENDOSCOPY;  Service: Cardiovascular;  Laterality: N/A;  . TEE WITHOUT CARDIOVERSION N/A 11/01/2012   Procedure: TRANSESOPHAGEAL ECHOCARDIOGRAM (TEE);  Surgeon: [name], MD;  Location[facility] ENDOSCOPY;  Service: Cardiovascular;  Laterality: N/A;  . TUBAL LIGATION       Current Outpatient Prescriptions  Medication Sig Dispense Refill  . amiodarone (PACERONE) 200 MG tablet Take 200 mg by mouth daily.    Marland Kitchen  amitriptyline (ELAVIL) 25 MG tablet Take 25 mg by mouth at bedtime.   3  . amoxicillin-clavulanate (AUGMENTIN) 875-125 MG tablet Take 1 tablet by mouth 2 (two) times daily. 7 days    . atorvastatin (LIPITOR) 10 MG tablet Take 10 mg by mouth daily.    Marland Kitchen CARTIA XT 120 MG 24 hr capsule TAKE 1 CAPSULE BY MOUTH DAILY 90 capsule 1  . cetirizine (ZYRTEC) 10 MG tablet Take 10 mg by mouth daily.    . CHOLECALCIFEROL PO Take 1 tablet by mouth daily.    Marland Kitchen ELIQUIS 5 MG TABS tablet TAKE 1 TABLET BY MOUTH TWICE DAILY. 180 tablet 1    . famotidine (PEPCID) 20 MG tablet Take 20 mg by mouth 2 (two) times daily.    . folic acid (FOLVITE) 1 MG tablet Take 1 mg by mouth daily.    . furosemide (LASIX) 40 MG tablet Take 1 tablet (40 mg total) by mouth daily. PLEASE CONTACT OFFICE FOR ADDITIONAL REFILLS 15 tablet 0  . gabapentin (NEURONTIN) 300 MG capsule Take 300 mg by mouth as needed.     Marland Kitchen HUMIRA PEN 40 MG/0.8ML PNKT     . HYDROcodone-acetaminophen (NORCO/VICODIN) 5-325 MG per tablet Take 1 tablet by mouth every 6 (six) hours as needed (pain).     . Insulin Syringe-Needle U-100 (INSULIN SYRINGE 1CC/31GX5/16") 31G X 5/16" 1 ML MISC   0  . Lancets (ACCU-CHEK MULTICLIX) lancets   11  . potassium chloride SA (K-DUR,KLOR-CON) 20 MEQ tablet Take 1 tablet (20 mEq total) by mouth daily. 30 tablet 6  . sertraline (ZOLOFT) 100 MG tablet Take 100 mg by mouth at bedtime.   1  . traZODone (DESYREL) 50 MG tablet Take 50 mg by mouth at bedtime.    . Multiple Vitamin (MULTIVITAMIN WITH MINERALS) TABS Take 1 tablet by mouth daily.    Marland Kitchen NOVOLIN 70/30 RELION (70-30) 100 UNIT/ML injection Inject 25-40 Units into the skin 2 (two) times daily with a meal. 40units in the morning and 25units in the evening    . oxyCODONE-acetaminophen (ROXICET) 5-325 MG per tablet Take 1 tablet by mouth every 4 (four) hours as needed. (Patient not taking: Reported on 12/17/2016) 30 tablet 0   No current facility-administered medications for this encounter.     Allergies:   Benicar [olmesartan]; Latex; Other; and Tikosyn [dofetilide]   Social History:  The patient  reports that she quit smoking about 22 years ago. She has never used smokeless tobacco. She reports that she does not drink alcohol or use drugs.   Family History:  The patient's  family history includes Diabetes type II in her father; Heart attack in her maternal grandfather, maternal grandmother, mother, and paternal grandmother.    ROS:  Please see the history of present illness.   All other systems are  reviewed and negative.    PHYSICAL EXAM: VS:  BP 118/62 (BP Location: Left Arm, Patient Position: Sitting, Cuff Size: Large)   Pulse 88   Ht 5\' 3"  (1.6 m)   Wt 219 lb (99.3 kg)   BMI 38.79 kg/m  , BMI Body mass index is 38.79 kg/m. GEN: Well nourished, well developed, in no acute distress  HEENT: normal  Neck: no JVD, carotid bruits, or masses Cardiac: IRRR; no murmurs, rubs, or gallops,no edema  Respiratory:  clear to auscultation bilaterally,few crackles rt lung base GI: soft, nontender, nondistended, + BS MS: no deformity or atrophy  Skin: warm and dry  Neuro:  Strength and sensation  are intact Psych: euthymic mood, full affect  EKG:  Rate controlled afib at 88 bpm, RBBB, qrs int 122 ms, qtc 486 ms    Lipid Panel     Wt Readings from Last 3 Encounters:  12/17/16 219 lb (99.3 kg)  08/30/15 211 lb (95.7 kg)  05/30/15 208 lb 12.8 oz (94.7 kg)      ASSESSMENT AND PLAN:  1.  Atrial fibrillation Unaware  Off amiodarone, reasons unclear x 18 months Pt would like to be reloaded on amiodarone as she feels fatigue may be from return to afib Continue apixaban 5 mg bid  Labs recently checked with pcp and husband will ask them to be forwarded to here when he checks to see if any of her providers stopped amiodarone   2. HTN stable  Avoid salt   3. Obestiy Struggles with weight gain States she is sedentary    Plan will depend on husband reporting back to me   Butch Penny C. Gao Mitnick, [facility] [address] [place], [place] [address] [phone]

## 2016-12-24 ENCOUNTER — Encounter (HOSPITAL_COMMUNITY): Payer: Self-pay | Admitting: Nurse Practitioner

## 2016-12-24 ENCOUNTER — Ambulatory Visit (HOSPITAL_COMMUNITY)
Admission: RE | Admit: 2016-12-24 | Discharge: 2016-12-24 | Disposition: A | Discharge status: Home / Self Care | Payer: PPO | Source: Ambulatory Visit | Attending: Nurse Practitioner | Admitting: Nurse Practitioner

## 2016-12-24 VITALS — BP 134/70 | HR 68 | Ht 63.0 in | Wt 220.4 lb

## 2016-12-24 DIAGNOSIS — F329 Major depressive disorder, single episode, unspecified: Secondary | ICD-10-CM | POA: Insufficient documentation

## 2016-12-24 DIAGNOSIS — E119 Type 2 diabetes mellitus without complications: Secondary | ICD-10-CM | POA: Diagnosis not present

## 2016-12-24 DIAGNOSIS — Z794 Long term (current) use of insulin: Secondary | ICD-10-CM | POA: Diagnosis not present

## 2016-12-24 DIAGNOSIS — K219 Gastro-esophageal reflux disease without esophagitis: Secondary | ICD-10-CM | POA: Diagnosis not present

## 2016-12-24 DIAGNOSIS — I4510 Unspecified right bundle-branch block: Secondary | ICD-10-CM | POA: Diagnosis not present

## 2016-12-24 DIAGNOSIS — E669 Obesity, unspecified: Secondary | ICD-10-CM | POA: Insufficient documentation

## 2016-12-24 DIAGNOSIS — Z87891 Personal history of nicotine dependence: Secondary | ICD-10-CM | POA: Diagnosis not present

## 2016-12-24 DIAGNOSIS — Z6839 Body mass index (BMI) 39.0-39.9, adult: Secondary | ICD-10-CM | POA: Diagnosis not present

## 2016-12-24 DIAGNOSIS — Z7901 Long term (current) use of anticoagulants: Secondary | ICD-10-CM | POA: Insufficient documentation

## 2016-12-24 DIAGNOSIS — Z79899 Other long term (current) drug therapy: Secondary | ICD-10-CM | POA: Insufficient documentation

## 2016-12-24 DIAGNOSIS — I4439 Other atrioventricular block: Secondary | ICD-10-CM | POA: Insufficient documentation

## 2016-12-24 DIAGNOSIS — E785 Hyperlipidemia, unspecified: Secondary | ICD-10-CM | POA: Insufficient documentation

## 2016-12-24 DIAGNOSIS — I481 Persistent atrial fibrillation: Secondary | ICD-10-CM | POA: Diagnosis not present

## 2016-12-24 DIAGNOSIS — I4819 Other persistent atrial fibrillation: Secondary | ICD-10-CM

## 2016-12-24 DIAGNOSIS — I10 Essential (primary) hypertension: Secondary | ICD-10-CM | POA: Diagnosis not present

## 2016-12-24 NOTE — Progress Notes (Signed)
[name], female   DOB: 08/19/1945, 71 y.o.   MRN: [id number]       Date:  12/24/2016   PCP:  Deland Pretty, MD  Cardiologist:  Dr [name] EP: Dr. [name] Primary Electrophysiologist: Roderic Palau, NP    Chief Complaint  Patient presents with  . Atrial Fibrillation     History of Present Illness: Alison Bell is a 71 y.o. female who presents today for f/u in the afib clinic.  She has not been seen since 08/30/15. She is unsure as to why she has not had f/u, she should have been back in spring of 2017.  She is in rate control afib today.Pharmacy was called pt has not had amiodarone filled x 18 months. She was unaware she was in afib today but husband and pt report that she is tired. Husband is unsure now if another medical provider may have stopped amiodarone. None of her physicians are in [place] so husband will call and find out. Otherwise, they are interested in her reloading on amiodarone to see if she can get back in SR to give her more energy.  On last visit, pt's husband did call and could not find a reason with other providers why amiodarone was stopped so it maust have fallen of her drug list by accident. She wanted to get back in rhythm so amio 200 mg bid was started. She is back in the office today for f/u. She feels no different on amiodarone. Ekg shows rate controlled afib . Qtc acceptable. No missed doses of eliquis.  Today, she denies symptoms of palpitations, chest pain,orthopnea, PND, lower extremity edema, claudication, dizziness, presyncope, syncope, bleeding, or neurologic sequela. The patient is tolerating medications without difficulties and is otherwise without complaint today.    Past Medical History:  Diagnosis Date  . Anemia   . Arthritis    rheumatoid  . Colon adenoma   . Depression   . Diabetes mellitus without complication ([place])   . Difficulty sleeping   . GERD (gastroesophageal reflux disease)   . Headache    migraines  . History of blood  transfusion   . HTN (hypertension)   . Hyperlipidemia   . LV dysfunction, EF 45-50% due to a. fib per echo 08/27/12  08/28/2012   a. Echo (10/15):  EF 55-60%, no RWMA, Gr 2 DD, mild MR, mod LAE, normal RVF, mild RAE, mild TR (LA 44 mm)  . Persistent atrial fibrillation ([place])   . Torsades de pointes [facility])    due to [facility] 04/2014   Past Surgical History:  Procedure Laterality Date  . ATRIAL FIBRILLATION ABLATION N/A 11/02/2012   PVI by Dr [name]  . CARDIOVERSION N/A 09/06/2012   Procedure: CARDIOVERSION;  Surgeon: [name], MD;  Location: [facility] ENDOSCOPY;  Service: Cardiovascular;  Laterality: N/A;  . CARDIOVERSION N/A 01/07/2013   Procedure: CARDIOVERSION;  Surgeon: [name], MD;  Location: [place] ENDOSCOPY;  Service: Cardiovascular;  Laterality: N/A;  . CARDIOVERSION N/A 11/14/2013   Procedure: CARDIOVERSION;  Surgeon: [name], MD;  Location: [facility] ENDOSCOPY;  Service: Cardiovascular;  Laterality: N/A;  . COLONOSCOPY N/A 07/28/2014   Procedure: COLONOSCOPY;  Surgeon: Ladene Artist, MD;  Location: [facility] ENDOSCOPY;  Service: Endoscopy;  Laterality: N/A;  . LAPAROSCOPIC SIGMOID COLECTOMY N/A 08/08/2014   Procedure: LAPAROSCOPIC SIGMOID COLECTOMY Sylvie Farrier PROCTOSCOPY;  Surgeon: Excell Seltzer, MD;  Location[facility] ORS;  Service: General;  Laterality: N/A;  . TEE WITHOUT CARDIOVERSION N/A 09/06/2012   Procedure: TRANSESOPHAGEAL ECHOCARDIOGRAM (  TEE);  Surgeon: [name], MD;  Location: [facility] ENDOSCOPY;  Service: Cardiovascular;  Laterality: N/A;  . TEE WITHOUT CARDIOVERSION N/A 11/01/2012   Procedure: TRANSESOPHAGEAL ECHOCARDIOGRAM (TEE);  Surgeon: [name], MD;  Location: [facility] ENDOSCOPY;  Service: Cardiovascular;  Laterality: N/A;  . TUBAL LIGATION       Current Outpatient Prescriptions  Medication Sig Dispense Refill  . amiodarone (PACERONE) 200 MG tablet Take 1 tablet (200mg ) by mouth twice a day for 1 month then decrease to 1 tablet daily 120 tablet 0  . amitriptyline (ELAVIL)  25 MG tablet Take 25 mg by mouth at bedtime.   3  . atorvastatin (LIPITOR) 10 MG tablet Take 10 mg by mouth daily.    Marland Kitchen CARTIA XT 120 MG 24 hr capsule TAKE 1 CAPSULE BY MOUTH DAILY 90 capsule 1  . cetirizine (ZYRTEC) 10 MG tablet Take 10 mg by mouth daily.    . CHOLECALCIFEROL PO Take 1 tablet by mouth daily.    Marland Kitchen ELIQUIS 5 MG TABS tablet TAKE 1 TABLET BY MOUTH TWICE DAILY. 180 tablet 1  . famotidine (PEPCID) 20 MG tablet Take 20 mg by mouth 2 (two) times daily.    . furosemide (LASIX) 40 MG tablet Take 1 tablet (40 mg total) by mouth daily. PLEASE CONTACT OFFICE FOR ADDITIONAL REFILLS 15 tablet 0  . gabapentin (NEURONTIN) 300 MG capsule Take 300 mg by mouth as needed.     Marland Kitchen HUMIRA PEN 40 MG/0.8ML PNKT     . HYDROcodone-acetaminophen (NORCO/VICODIN) 5-325 MG per tablet Take 1 tablet by mouth every 6 (six) hours as needed (pain).     . Insulin Syringe-Needle U-100 (INSULIN SYRINGE 1CC/31GX5/16") 31G X 5/16" 1 ML MISC   0  . Lancets (ACCU-CHEK MULTICLIX) lancets   11  . Multiple Vitamin (MULTIVITAMIN WITH MINERALS) TABS Take 1 tablet by mouth daily.    Marland Kitchen NOVOLIN 70/30 RELION (70-30) 100 UNIT/ML injection Inject 25-40 Units into the skin 2 (two) times daily with a meal. 40units in the morning and 25units in the evening    . oxyCODONE-acetaminophen (ROXICET) 5-325 MG per tablet Take 1 tablet by mouth every 4 (four) hours as needed. 30 tablet 0  . potassium chloride SA (K-DUR,KLOR-CON) 20 MEQ tablet Take 1 tablet (20 mEq total) by mouth daily. 30 tablet 6  . sertraline (ZOLOFT) 100 MG tablet Take 100 mg by mouth at bedtime.   1  . traZODone (DESYREL) 50 MG tablet Take 50 mg by mouth at bedtime.     No current facility-administered medications for this encounter.     Allergies:   Benicar [olmesartan]; Latex; Other; and Tikosyn [dofetilide]   Social History:  The patient  reports that she quit smoking about 22 years ago. She has never used smokeless tobacco. She reports that she does not drink  alcohol or use drugs.   Family History:  The patient's  family history includes Diabetes type II in her father; Heart attack in her maternal grandfather, maternal grandmother, mother, and paternal grandmother.    ROS:  Please see the history of present illness.   All other systems are reviewed and negative.    PHYSICAL EXAM: VS:  BP 134/70 (BP Location: Left Arm, Patient Position: Sitting, Cuff Size: Large)   Pulse 68   Ht 5\' 3"  (1.6 m)   Wt 220 lb 6.4 oz (100 kg)   BMI 39.04 kg/m  , BMI Body mass index is 39.04 kg/m. GEN: Well nourished, well developed, in no acute  distress  HEENT: normal  Neck: no JVD, carotid bruits, or masses Cardiac: IRRR; no murmurs, rubs, or gallops,no edema  Respiratory:  clear to auscultation bilaterally,few crackles rt lung base GI: soft, nontender, nondistended, + BS MS: no deformity or atrophy  Skin: warm and dry  Neuro:  Strength and sensation are intact Psych: euthymic mood, full affect  EKG: rate controlled afib at 68 bpm, RBBB qrs int 138 ms, qtc 452 ms    Lipid Panel     Wt Readings from Last 3 Encounters:  12/24/16 220 lb 6.4 oz (100 kg)  12/17/16 219 lb (99.3 kg)  08/30/15 211 lb (95.7 kg)      ASSESSMENT AND PLAN:  1.  Atrial fibrillation Unaware  Off amiodarone, reasons unclear x 18 months, apparently just fell off drug list Has started reload  on amiodarone as she feels fatigue may be from return to afib Continue apixaban 5 mg bid, asked not to miss doses as she is pending probable cardioversion in a few weeks    2. HTN stable  Avoid salt   3. Obestiy Struggles with weight gain States she is sedentary     F/u in 3 weeks when cardioversion will be scheduled   Butch Penny C. Bostyn Kunkler, [facility] [address] [place], [place] [address] [phone]

## 2017-01-07 DIAGNOSIS — M0579 Rheumatoid arthritis with rheumatoid factor of multiple sites without organ or systems involvement: Secondary | ICD-10-CM | POA: Diagnosis not present

## 2017-01-07 DIAGNOSIS — I4891 Unspecified atrial fibrillation: Secondary | ICD-10-CM | POA: Diagnosis not present

## 2017-01-07 DIAGNOSIS — Z79899 Other long term (current) drug therapy: Secondary | ICD-10-CM | POA: Diagnosis not present

## 2017-01-13 ENCOUNTER — Ambulatory Visit (HOSPITAL_COMMUNITY)
Admission: RE | Admit: 2017-01-13 | Discharge: 2017-01-13 | Disposition: A | Discharge status: Home / Self Care | Payer: PPO | Source: Ambulatory Visit | Attending: Nurse Practitioner | Admitting: Nurse Practitioner

## 2017-01-13 ENCOUNTER — Encounter (HOSPITAL_COMMUNITY): Payer: Self-pay | Admitting: Nurse Practitioner

## 2017-01-13 VITALS — BP 132/66 | HR 69 | Ht 63.0 in | Wt 222.4 lb

## 2017-01-13 DIAGNOSIS — I4510 Unspecified right bundle-branch block: Secondary | ICD-10-CM | POA: Insufficient documentation

## 2017-01-13 DIAGNOSIS — Z87891 Personal history of nicotine dependence: Secondary | ICD-10-CM | POA: Diagnosis not present

## 2017-01-13 DIAGNOSIS — K219 Gastro-esophageal reflux disease without esophagitis: Secondary | ICD-10-CM | POA: Insufficient documentation

## 2017-01-13 DIAGNOSIS — I10 Essential (primary) hypertension: Secondary | ICD-10-CM | POA: Diagnosis not present

## 2017-01-13 DIAGNOSIS — F329 Major depressive disorder, single episode, unspecified: Secondary | ICD-10-CM | POA: Insufficient documentation

## 2017-01-13 DIAGNOSIS — E119 Type 2 diabetes mellitus without complications: Secondary | ICD-10-CM | POA: Insufficient documentation

## 2017-01-13 DIAGNOSIS — I481 Persistent atrial fibrillation: Secondary | ICD-10-CM

## 2017-01-13 DIAGNOSIS — E785 Hyperlipidemia, unspecified: Secondary | ICD-10-CM | POA: Insufficient documentation

## 2017-01-13 DIAGNOSIS — I4892 Unspecified atrial flutter: Secondary | ICD-10-CM | POA: Insufficient documentation

## 2017-01-13 DIAGNOSIS — R5383 Other fatigue: Secondary | ICD-10-CM | POA: Insufficient documentation

## 2017-01-13 DIAGNOSIS — Z794 Long term (current) use of insulin: Secondary | ICD-10-CM | POA: Insufficient documentation

## 2017-01-13 DIAGNOSIS — I4819 Other persistent atrial fibrillation: Secondary | ICD-10-CM

## 2017-01-13 LAB — BASIC METABOLIC PANEL
Anion gap: 8 (ref 5–15)
BUN: 18 mg/dL (ref 6–20)
CO2: 28 mmol/L (ref 22–32)
Calcium: 9.3 mg/dL (ref 8.9–10.3)
Chloride: 103 mmol/L (ref 101–111)
Creatinine, Ser: 1.14 mg/dL — ABNORMAL HIGH (ref 0.44–1.00)
GFR calc Af Amer: 54 mL/min — ABNORMAL LOW (ref >60)
GFR calc non Af Amer: 47 mL/min — ABNORMAL LOW (ref >60)
Glucose, Bld: 155 mg/dL — ABNORMAL HIGH (ref 65–99)
Potassium: 4 mmol/L (ref 3.5–5.1)
Sodium: 139 mmol/L (ref 135–145)

## 2017-01-13 LAB — CBC
HCT: 41.1 % (ref 36.0–46.0)
Hemoglobin: 13.7 g/dL (ref 12.0–15.0)
MCH: 30.5 pg (ref 26.0–34.0)
MCHC: 33.3 g/dL (ref 30.0–36.0)
MCV: 91.5 fL (ref 78.0–100.0)
Platelets: 296 10*3/uL (ref 150–400)
RBC: 4.49 MIL/uL (ref 3.87–5.11)
RDW: 13.7 % (ref 11.5–15.5)
WBC: 9.9 10*3/uL (ref 4.0–10.5)

## 2017-01-13 NOTE — Progress Notes (Signed)
[name], female   DOB: 10-Sep-1945, 71 y.o.   MRN: [id number]       Date:  01/13/2017   PCP:  Deland Pretty, MD  Cardiologist:  Dr [name] EP: Dr. [name] Primary Electrophysiologist: Roderic Palau, NP    Chief Complaint  Patient presents with  . Atrial Fibrillation     History of Present Illness: Alison Bell is a 71 y.o. female who presents today for f/u in the afib clinic, 12/17/16.  She has not been seen since 08/30/15. She is unsure as to why she has not had f/u, she should have been back in spring of 2017. Amiodarone was not listed on her med sheet. She is in rate control afib today.Pharmacy was called pt has not had amiodarone filled x 18 months. She was unaware she was in afib today but husband and pt report that she is tired. Husband is unsure now if another medical provider may have stopped amiodarone. None of her physicians are in [place] so husband will call and find out. Otherwise, they are interested in her reloading on amiodarone to see if she can get back in SR to give her more energy. She had torsades years ago on Tikosyn while loading and it was d/c.  Pt's husband did call and could not find a reason with other providers why amiodarone was stopped so it must have fallen of her drug list by accident. She wanted to get back in rhythm so amio 200 mg bid was started 5/30,. She is back in the office  and continues in aflutter with variable AV block. She will be set up for cardioversion July 2nd.  Today, she denies symptoms of palpitations, chest pain,orthopnea, PND, lower extremity edema, claudication, dizziness, presyncope, syncope, bleeding, or neurologic sequela. Positive for fatigue The patient is tolerating medications without difficulties and is otherwise without complaint today.    Past Medical History:  Diagnosis Date  . Anemia   . Arthritis    rheumatoid  . Colon adenoma   . Depression   . Diabetes mellitus without complication ([place])   . Difficulty sleeping     . GERD (gastroesophageal reflux disease)   . Headache    migraines  . History of blood transfusion   . HTN (hypertension)   . Hyperlipidemia   . LV dysfunction, EF 45-50% due to a. fib per echo 08/27/12  08/28/2012   a. Echo (10/15):  EF 55-60%, no RWMA, Gr 2 DD, mild MR, mod LAE, normal RVF, mild RAE, mild TR (LA 44 mm)  . Persistent atrial fibrillation ([place])   . Torsades de pointes [facility])    due to [facility] 04/2014   Past Surgical History:  Procedure Laterality Date  . ATRIAL FIBRILLATION ABLATION N/A 11/02/2012   PVI by Dr [name]  . CARDIOVERSION N/A 09/06/2012   Procedure: CARDIOVERSION;  Surgeon: [name], MD;  Location: [facility] ENDOSCOPY;  Service: Cardiovascular;  Laterality: N/A;  . CARDIOVERSION N/A 01/07/2013   Procedure: CARDIOVERSION;  Surgeon: [name], MD;  Location: [place] ENDOSCOPY;  Service: Cardiovascular;  Laterality: N/A;  . CARDIOVERSION N/A 11/14/2013   Procedure: CARDIOVERSION;  Surgeon: [name], MD;  Location: [facility] ENDOSCOPY;  Service: Cardiovascular;  Laterality: N/A;  . COLONOSCOPY N/A 07/28/2014   Procedure: COLONOSCOPY;  Surgeon: Ladene Artist, MD;  Location: [facility] ENDOSCOPY;  Service: Endoscopy;  Laterality: N/A;  . LAPAROSCOPIC SIGMOID COLECTOMY N/A 08/08/2014   Procedure: LAPAROSCOPIC SIGMOID COLECTOMY Sylvie Farrier PROCTOSCOPY;  Surgeon: Excell Seltzer, MD;  Location: [facility]  ORS;  Service: General;  Laterality: N/A;  . TEE WITHOUT CARDIOVERSION N/A 09/06/2012   Procedure: TRANSESOPHAGEAL ECHOCARDIOGRAM (TEE);  Surgeon: [name], MD;  Location: [facility] ENDOSCOPY;  Service: Cardiovascular;  Laterality: N/A;  . TEE WITHOUT CARDIOVERSION N/A 11/01/2012   Procedure: TRANSESOPHAGEAL ECHOCARDIOGRAM (TEE);  Surgeon: [name], MD;  Location: [facility] ENDOSCOPY;  Service: Cardiovascular;  Laterality: N/A;  . TUBAL LIGATION       Current Outpatient Prescriptions  Medication Sig Dispense Refill  . amiodarone (PACERONE) 200 MG tablet Take 1 tablet (200mg ) by mouth  twice a day for 1 month then decrease to 1 tablet daily 120 tablet 0  . amitriptyline (ELAVIL) 25 MG tablet Take 25 mg by mouth at bedtime.   3  . atorvastatin (LIPITOR) 10 MG tablet Take 10 mg by mouth daily.    Marland Kitchen CARTIA XT 120 MG 24 hr capsule TAKE 1 CAPSULE BY MOUTH DAILY 90 capsule 1  . cetirizine (ZYRTEC) 10 MG tablet Take 10 mg by mouth daily.    . CHOLECALCIFEROL PO Take 1 tablet by mouth daily.    Marland Kitchen ELIQUIS 5 MG TABS tablet TAKE 1 TABLET BY MOUTH TWICE DAILY. 180 tablet 1  . famotidine (PEPCID) 20 MG tablet Take 20 mg by mouth 2 (two) times daily.    . furosemide (LASIX) 40 MG tablet Take 1 tablet (40 mg total) by mouth daily. PLEASE CONTACT OFFICE FOR ADDITIONAL REFILLS (Patient taking differently: Take 40 mg by mouth daily. ) 15 tablet 0  . gabapentin (NEURONTIN) 300 MG capsule Take 300 mg by mouth as needed.     Marland Kitchen HUMIRA PEN 40 MG/0.8ML PNKT     . HYDROcodone-acetaminophen (NORCO/VICODIN) 5-325 MG per tablet Take 1 tablet by mouth every 6 (six) hours as needed (pain).     . Insulin Syringe-Needle U-100 (INSULIN SYRINGE 1CC/31GX5/16") 31G X 5/16" 1 ML MISC   0  . Lancets (ACCU-CHEK MULTICLIX) lancets   11  . Multiple Vitamin (MULTIVITAMIN WITH MINERALS) TABS Take 1 tablet by mouth daily.    Marland Kitchen NOVOLIN 70/30 RELION (70-30) 100 UNIT/ML injection Inject 35-60 Units into the skin 2 (two) times daily with a meal. 60 units in the morning and 35 units in the evening    . oxyCODONE-acetaminophen (ROXICET) 5-325 MG per tablet Take 1 tablet by mouth every 4 (four) hours as needed. 30 tablet 0  . potassium chloride SA (K-DUR,KLOR-CON) 20 MEQ tablet Take 1 tablet (20 mEq total) by mouth daily. 30 tablet 6  . sertraline (ZOLOFT) 100 MG tablet Take 100 mg by mouth at bedtime.   1  . traZODone (DESYREL) 50 MG tablet Take 50 mg by mouth at bedtime.     No current facility-administered medications for this encounter.     Allergies:   Benicar [olmesartan]; Latex; Other; and Tikosyn [dofetilide]    Social History:  The patient  reports that she quit smoking about 22 years ago. She has never used smokeless tobacco. She reports that she does not drink alcohol or use drugs.   Family History:  The patient's  family history includes Diabetes type II in her father; Heart attack in her maternal grandfather, maternal grandmother, mother, and paternal grandmother.    ROS:  Please see the history of present illness.   All other systems are reviewed and negative.    PHYSICAL EXAM: VS:  BP 132/66 (BP Location: Left Arm, Patient Position: Sitting, Cuff Size: Large)   Pulse 69   Ht 5\' 3"  (1.6 m)  Wt 222 lb 6.4 oz (100.9 kg)   BMI 39.40 kg/m  , BMI Body mass index is 39.4 kg/m. GEN: Well nourished, well developed, in no acute distress  HEENT: normal  Neck: no JVD, carotid bruits, or masses Cardiac: IRRR; no murmurs, rubs, or gallops,no edema  Respiratory:  clear to auscultation bilaterally,few crackles rt lung base GI: soft, nontender, nondistended, + BS MS: no deformity or atrophy  Skin: warm and dry  Neuro:  Strength and sensation are intact Psych: euthymic mood, full affect  EKG: rate controlled afib at 68 bpm, RBBB qrs int 138 ms, qtc 452 ms    Lipid Panel     Wt Readings from Last 3 Encounters:  01/13/17 222 lb 6.4 oz (100.9 kg)  12/24/16 220 lb 6.4 oz (100 kg)  12/17/16 219 lb (99.3 kg)      ASSESSMENT AND PLAN:  1.  Atrial fibrillation/flutter Symptomatic with fatigue Off amiodarone, reasons unclear x 18 months, apparently just fell off drug list Has started reload on amiodarone as she feels fatigue may be from return to afib Continue 200 mg of amiodarone bid but decrease to one a day after  cardioversion Continue apixaban 5 mg bid,states no missed doses Scheduled for cardioversion 7/2 Will hold Cardizem am of cardioversion, until HR in rhythm is known  2. HTN Stable  Avoid salt  3. Obestiy Struggles with weight gain States she is sedentary     F/u 1  week after [place]. [place], [facility] [address] [place], [place] [address] [phone]

## 2017-01-13 NOTE — Patient Instructions (Signed)
Cardioversion scheduled for Monday, July 2nd  - Arrive at the [facility] and go to admitting at 11:30AM  -Do not eat or drink anything after midnight the night prior to your procedure.  - Take all your medication with a sip of water prior to arrival except hold cardizem morning of procedure.  - You will not be able to drive home after your procedure.  After cardioversion decrease amiodarone to once a day

## 2017-01-19 ENCOUNTER — Encounter (HOSPITAL_COMMUNITY)
Admission: RE | Disposition: A | Discharge status: Home / Self Care | Payer: Self-pay | Source: Ambulatory Visit | Attending: Internal Medicine

## 2017-01-19 ENCOUNTER — Encounter (HOSPITAL_COMMUNITY): Payer: Self-pay

## 2017-01-19 ENCOUNTER — Ambulatory Visit (HOSPITAL_COMMUNITY): Payer: PPO | Admitting: Certified Registered Nurse Anesthetist

## 2017-01-19 ENCOUNTER — Ambulatory Visit (HOSPITAL_COMMUNITY)
Admission: RE | Admit: 2017-01-19 | Discharge: 2017-01-19 | Disposition: A | Discharge status: Home / Self Care | Payer: PPO | Source: Ambulatory Visit | Attending: Internal Medicine | Admitting: Internal Medicine

## 2017-01-19 DIAGNOSIS — E785 Hyperlipidemia, unspecified: Secondary | ICD-10-CM | POA: Diagnosis not present

## 2017-01-19 DIAGNOSIS — Z91040 Latex allergy status: Secondary | ICD-10-CM | POA: Diagnosis not present

## 2017-01-19 DIAGNOSIS — Z87891 Personal history of nicotine dependence: Secondary | ICD-10-CM | POA: Diagnosis not present

## 2017-01-19 DIAGNOSIS — E119 Type 2 diabetes mellitus without complications: Secondary | ICD-10-CM | POA: Insufficient documentation

## 2017-01-19 DIAGNOSIS — Z794 Long term (current) use of insulin: Secondary | ICD-10-CM | POA: Diagnosis not present

## 2017-01-19 DIAGNOSIS — Z79899 Other long term (current) drug therapy: Secondary | ICD-10-CM | POA: Diagnosis not present

## 2017-01-19 DIAGNOSIS — I110 Hypertensive heart disease with heart failure: Secondary | ICD-10-CM | POA: Insufficient documentation

## 2017-01-19 DIAGNOSIS — I509 Heart failure, unspecified: Secondary | ICD-10-CM | POA: Diagnosis not present

## 2017-01-19 DIAGNOSIS — I4891 Unspecified atrial fibrillation: Secondary | ICD-10-CM | POA: Diagnosis present

## 2017-01-19 DIAGNOSIS — F329 Major depressive disorder, single episode, unspecified: Secondary | ICD-10-CM | POA: Diagnosis not present

## 2017-01-19 DIAGNOSIS — Z888 Allergy status to other drugs, medicaments and biological substances status: Secondary | ICD-10-CM | POA: Diagnosis not present

## 2017-01-19 DIAGNOSIS — K219 Gastro-esophageal reflux disease without esophagitis: Secondary | ICD-10-CM | POA: Diagnosis not present

## 2017-01-19 DIAGNOSIS — M1990 Unspecified osteoarthritis, unspecified site: Secondary | ICD-10-CM | POA: Insufficient documentation

## 2017-01-19 DIAGNOSIS — Z7901 Long term (current) use of anticoagulants: Secondary | ICD-10-CM | POA: Diagnosis not present

## 2017-01-19 DIAGNOSIS — I481 Persistent atrial fibrillation: Secondary | ICD-10-CM | POA: Diagnosis not present

## 2017-01-19 HISTORY — PX: CARDIOVERSION: SHX1299

## 2017-01-19 LAB — GLUCOSE, CAPILLARY: Glucose-Capillary: 186 mg/dL — ABNORMAL HIGH (ref 65–99)

## 2017-01-19 SURGERY — CARDIOVERSION
Anesthesia: General

## 2017-01-19 MED ORDER — SODIUM CHLORIDE 0.9 % IV SOLN
INTRAVENOUS | Status: DC | PRN
  Administered 2017-01-19: 13:00:00 via INTRAVENOUS

## 2017-01-19 MED ORDER — PROPOFOL 10 MG/ML IV BOLUS
INTRAVENOUS | Status: DC | PRN
  Administered 2017-01-19: 80 mg via INTRAVENOUS
  Administered 2017-01-19: 70 mg via INTRAVENOUS

## 2017-01-19 MED ORDER — LIDOCAINE 2% (20 MG/ML) 5 ML SYRINGE
INTRAMUSCULAR | Status: DC | PRN
  Administered 2017-01-19: 80 mg via INTRAVENOUS

## 2017-01-19 NOTE — H&P (Signed)
   INTERVAL PROCEDURE H&P  History and Physical Interval Note:  01/19/2017 12:01 PM  [facility] has presented today for their planned procedure. The various methods of treatment have been discussed with the patient and family. After consideration of risks, benefits and other options for treatment, the patient has consented to the procedure.  The patients' outpatient history has been reviewed, patient examined, and no change in status from most recent office note within the past 30 days. I have reviewed the patients' chart and labs and will proceed as planned. Questions were answered to the patient's satisfaction.   [name], MD, [place]  Attending Cardiologist  Direct Dial: [phone]  Fax: [phone]  Website:  [email or web address] 01/19/2017, 12:01 PM

## 2017-01-19 NOTE — CV Procedure (Signed)
   CARDIOVERSION NOTE  Procedure: Electrical Cardioversion Indications:  Atrial Fibrillation  Procedure Details:  Consent: Risks of procedure as well as the alternatives and risks of each were explained to the (patient/caregiver).  Consent for procedure obtained.  Time Out: Verified patient identification, verified procedure, site/side was marked, verified correct patient position, special equipment/implants available, medications/allergies/relevent history reviewed, required imaging and test results available.  Performed  Patient placed on cardiac monitor, pulse oximetry, supplemental oxygen as necessary.  Sedation given: Propofol per anesthesia Pacer pads placed anterior and posterior chest.  Cardioverted 3 time(s).  Cardioverted at 200J x 2, then 360J via the Kittrell 10 defibrillator.  Impression: Findings: Post procedure EKG shows: NSR Complications: None Patient did tolerate procedure well.  Plan: 1. Ultimately successful DCCV with the LifePack 10 defibrillator at 360J. 2. Decrease amiodarone to 200 mg daily as per Kerby Less instructions. 3. Follow-up in a-fib clinic.  Time Spent Directly with the Patient:  30 minutes   [name], MD, [place]  Attending Cardiologist  Direct Dial: [phone]  Fax: [phone]  Website:  [email or web address] 01/19/2017, 12:52 PM

## 2017-01-19 NOTE — Discharge Instructions (Signed)
Electrical Cardioversion, Care After °This sheet gives you information about how to care for yourself after your procedure. Your health care provider may also give you more specific instructions. If you have problems or questions, contact your health care provider. °What can I expect after the procedure? °After the procedure, it is common to have: °· Some redness on the skin where the shocks were given. ° °Follow these instructions at home: °· Do not drive for 24 hours if you were given a medicine to help you relax (sedative). °· Take over-the-counter and prescription medicines only as told by your health care provider. °· Ask your health care provider how to check your pulse. Check it often. °· Rest for 48 hours after the procedure or as told by your health care provider. °· Avoid or limit your caffeine use as told by your health care provider. °Contact a health care provider if: °· You feel like your heart is beating too quickly or your pulse is not regular. °· You have a serious muscle cramp that does not go away. °Get help right away if: °· You have discomfort in your chest. °· You are dizzy or you feel faint. °· You have trouble breathing or you are short of breath. °· Your speech is slurred. °· You have trouble moving an arm or leg on one side of your body. °· Your fingers or toes turn cold or blue. °This information is not intended to replace advice given to you by your health care provider. Make sure you discuss any questions you have with your health care provider. °Document Released: 04/27/2013 Document Revised: 02/08/2016 Document Reviewed: 01/11/2016 °Elsevier Interactive Patient Education © 2018 Elsevier Inc. ° °

## 2017-01-19 NOTE — Anesthesia Postprocedure Evaluation (Signed)
Anesthesia Post Note  Patient: Alison Bell  Procedure(s) Performed: Procedure(s) (LRB): CARDIOVERSION (N/A)     Patient location during evaluation: PACU Anesthesia Type: General Level of consciousness: awake and alert Pain management: pain level controlled Vital Signs Assessment: post-procedure vital signs reviewed and stable Respiratory status: spontaneous breathing, nonlabored ventilation and respiratory function stable Cardiovascular status: stable and blood pressure returned to baseline Anesthetic complications: no    Last Vitals:  Vitals:   01/19/17 1247 01/19/17 1302  BP: (!) 129/41 (!) 129/51  Pulse:  64  Resp: 15 13  Temp: 36.8 C     Last Pain:  Vitals:   01/19/17 1247  TempSrc: Oral                 Lynda Rainwater

## 2017-01-19 NOTE — Anesthesia Preprocedure Evaluation (Signed)
Anesthesia Evaluation  Patient identified by MRN, date of birth, ID band Patient awake    Reviewed: Allergy & Precautions, H&P , NPO status , Patient's Chart, lab work & pertinent test results  Airway Mallampati: II  TM Distance: >3 FB Neck ROM: full    Dental no notable dental hx.    Pulmonary neg pulmonary ROS, shortness of breath and with exertion, former smoker,    Pulmonary exam normal breath sounds clear to auscultation       Cardiovascular hypertension, Pt. on medications +CHF  Normal cardiovascular exam+ dysrhythmias Atrial Fibrillation  Rhythm:regular Rate:Normal  Diastolic heart failure. EF 55%. History Torsade   Neuro/Psych  Headaches, Depression negative neurological ROS  negative psych ROS   GI/Hepatic negative GI ROS, Neg liver ROS, GERD  Medicated and Controlled,  Endo/Other  diabetes, Well Controlled, Type 2, Insulin Dependent  Renal/GU negative Renal ROS  negative genitourinary   Musculoskeletal  (+) Arthritis ,   Abdominal   Peds  Hematology negative hematology ROS (+) anemia , hgb 9   Anesthesia Other Findings   Reproductive/Obstetrics negative OB ROS                             Anesthesia Physical  Anesthesia Plan  ASA: III  Anesthesia Plan: General   Post-op Pain Management:    Induction: Intravenous  PONV Risk Score and Plan: 3 and Ondansetron, Propofol and Midazolam  Airway Management Planned: Mask  Additional Equipment:   Intra-op Plan:   Post-operative Plan:   Informed Consent: I have reviewed the patients History and Physical, chart, labs and discussed the procedure including the risks, benefits and alternatives for the proposed anesthesia with the patient or authorized representative who has indicated his/her understanding and acceptance.   Dental Advisory Given  Plan Discussed with: CRNA and Surgeon  Anesthesia Plan Comments:          Anesthesia Quick Evaluation

## 2017-01-19 NOTE — Transfer of Care (Signed)
Immediate Anesthesia Transfer of Care Note  Patient: Alison Bell  Procedure(s) Performed: Procedure(s): CARDIOVERSION (N/A)  Patient Location: PACU and Endoscopy Unit  Anesthesia Type:General  Level of Consciousness: patient cooperative and responds to stimulation  Airway & Oxygen Therapy: Patient Spontanous Breathing and Patient connected to nasal cannula oxygen  Post-op Assessment: Report given to RN and Post -op Vital signs reviewed and stable  Post vital signs: Reviewed and stable  Last Vitals:  Vitals:   01/19/17 1155  BP: (!) 147/55  Pulse: 80  Resp: 14  Temp: 36.7 C    Last Pain:  Vitals:   01/19/17 1155  TempSrc: Oral         Complications: No apparent anesthesia complications

## 2017-01-20 ENCOUNTER — Other Ambulatory Visit (HOSPITAL_COMMUNITY): Payer: Self-pay | Admitting: *Deleted

## 2017-01-20 MED ORDER — AMIODARONE HCL 200 MG PO TABS: 200.0000 mg | ORAL_TABLET | Freq: Every day | ORAL | 2 refills | Status: DC

## 2017-01-27 ENCOUNTER — Ambulatory Visit (HOSPITAL_COMMUNITY)
Admission: RE | Admit: 2017-01-27 | Discharge: 2017-01-27 | Disposition: A | Discharge status: Home / Self Care | Payer: PPO | Source: Ambulatory Visit | Attending: Nurse Practitioner | Admitting: Nurse Practitioner

## 2017-01-27 ENCOUNTER — Encounter (HOSPITAL_COMMUNITY): Payer: Self-pay | Admitting: Nurse Practitioner

## 2017-01-27 VITALS — BP 142/82 | HR 116 | Ht 63.0 in | Wt 220.2 lb

## 2017-01-27 DIAGNOSIS — Z79891 Long term (current) use of opiate analgesic: Secondary | ICD-10-CM | POA: Diagnosis not present

## 2017-01-27 DIAGNOSIS — I4891 Unspecified atrial fibrillation: Secondary | ICD-10-CM | POA: Insufficient documentation

## 2017-01-27 DIAGNOSIS — Z7901 Long term (current) use of anticoagulants: Secondary | ICD-10-CM | POA: Insufficient documentation

## 2017-01-27 DIAGNOSIS — F329 Major depressive disorder, single episode, unspecified: Secondary | ICD-10-CM | POA: Diagnosis not present

## 2017-01-27 DIAGNOSIS — Z79899 Other long term (current) drug therapy: Secondary | ICD-10-CM | POA: Insufficient documentation

## 2017-01-27 DIAGNOSIS — E119 Type 2 diabetes mellitus without complications: Secondary | ICD-10-CM | POA: Insufficient documentation

## 2017-01-27 DIAGNOSIS — I10 Essential (primary) hypertension: Secondary | ICD-10-CM | POA: Diagnosis not present

## 2017-01-27 DIAGNOSIS — I4892 Unspecified atrial flutter: Secondary | ICD-10-CM | POA: Diagnosis not present

## 2017-01-27 DIAGNOSIS — E785 Hyperlipidemia, unspecified: Secondary | ICD-10-CM | POA: Diagnosis not present

## 2017-01-27 DIAGNOSIS — Z9851 Tubal ligation status: Secondary | ICD-10-CM | POA: Insufficient documentation

## 2017-01-27 DIAGNOSIS — Z794 Long term (current) use of insulin: Secondary | ICD-10-CM | POA: Insufficient documentation

## 2017-01-27 DIAGNOSIS — I481 Persistent atrial fibrillation: Secondary | ICD-10-CM

## 2017-01-27 DIAGNOSIS — Z87891 Personal history of nicotine dependence: Secondary | ICD-10-CM | POA: Diagnosis not present

## 2017-01-27 DIAGNOSIS — E669 Obesity, unspecified: Secondary | ICD-10-CM | POA: Insufficient documentation

## 2017-01-27 DIAGNOSIS — K219 Gastro-esophageal reflux disease without esophagitis: Secondary | ICD-10-CM | POA: Diagnosis not present

## 2017-01-27 DIAGNOSIS — Z6839 Body mass index (BMI) 39.0-39.9, adult: Secondary | ICD-10-CM | POA: Diagnosis not present

## 2017-01-27 DIAGNOSIS — I4819 Other persistent atrial fibrillation: Secondary | ICD-10-CM

## 2017-01-27 MED ORDER — DILTIAZEM HCL ER COATED BEADS 120 MG PO CP24: 120.0000 mg | ORAL_CAPSULE | Freq: Two times a day (BID) | ORAL | 2 refills | Status: DC

## 2017-01-27 MED ORDER — AMIODARONE HCL 200 MG PO TABS: 200.0000 mg | ORAL_TABLET | Freq: Every day | ORAL | 2 refills | Status: DC

## 2017-01-27 NOTE — Progress Notes (Signed)
[name], female   DOB: Feb 01, 1946, 71 y.o.   MRN: [id number]       Date:  01/27/2017   PCP:  Deland Pretty, MD  Cardiologist:  Dr [name] EP: Dr. [name] Primary Electrophysiologist: Roderic Palau, NP    Chief Complaint  Patient presents with  . Atrial Fibrillation     History of Present Illness: Alison Bell is a 71 y.o. female who presents today for f/u in the afib clinic, 12/17/16.  She has not been seen since 08/30/15. She is unsure as to why she has not had f/u, she should have been back in spring of 2017. Amiodarone was not listed on her med sheet. She is in rate control afib today.Pharmacy was called pt has not had amiodarone filled x 18 months. She was unaware she was in afib today but husband and pt report that she is tired. Husband is unsure now if another medical provider may have stopped amiodarone. None of her physicians are in [place] so husband will call and find out. Otherwise, they are interested in her reloading on amiodarone to see if she can get back in SR to give her more energy. She had torsades years ago on Tikosyn while loading and it was d/c.  Pt's husband did call and could not find a reason with other providers why amiodarone was stopped so it must have fallen of her drug list by accident. She wanted to get back in rhythm so amio 200 mg bid was started 5/30,. She is back in the office  and continues in aflutter with variable AV block. She will be set up for cardioversion July 2nd.  F/u in afib clinic 7/10. She did have successful cardioversion, 7/2, but unfortunately today, EKG shows a flutter at 116 bpm. She does not feel as well with some dizziness, H/A, fatigue. Continues on amiodarone at 200 mg daily.  Today, she denies symptoms of palpitations, chest pain,orthopnea, PND, lower extremity edema, claudication, dizziness, presyncope, syncope, bleeding, or neurologic sequela. Positive for fatigue, H/A,lightheadeness. The patient is tolerating medications without  difficulties and is otherwise without complaint today.    Past Medical History:  Diagnosis Date  . Anemia   . Arthritis    rheumatoid  . Colon adenoma   . Depression   . Diabetes mellitus without complication ([place])   . Difficulty sleeping   . GERD (gastroesophageal reflux disease)   . Headache    migraines  . History of blood transfusion   . HTN (hypertension)   . Hyperlipidemia   . LV dysfunction, EF 45-50% due to a. fib per echo 08/27/12  08/28/2012   a. Echo (10/15):  EF 55-60%, no RWMA, Gr 2 DD, mild MR, mod LAE, normal RVF, mild RAE, mild TR (LA 44 mm)  . Persistent atrial fibrillation ([place])   . Torsades de pointes [facility])    due to [facility] 04/2014   Past Surgical History:  Procedure Laterality Date  . ATRIAL FIBRILLATION ABLATION N/A 11/02/2012   PVI by Dr [name]  . CARDIOVERSION N/A 09/06/2012   Procedure: CARDIOVERSION;  Surgeon: [name], MD;  Location: [facility] ENDOSCOPY;  Service: Cardiovascular;  Laterality: N/A;  . CARDIOVERSION N/A 01/07/2013   Procedure: CARDIOVERSION;  Surgeon: [name], MD;  Location: [facility] ENDOSCOPY;  Service: Cardiovascular;  Laterality: N/A;  . CARDIOVERSION N/A 11/14/2013   Procedure: CARDIOVERSION;  Surgeon: [name], MD;  Location: [facility];  Service: Cardiovascular;  Laterality: N/A;  . CARDIOVERSION N/A 01/19/2017   Procedure:  CARDIOVERSION;  Surgeon: [name], MD;  Location: [facility] ENDOSCOPY;  Service: Cardiovascular;  Laterality: N/A;  . COLONOSCOPY N/A 07/28/2014   Procedure: COLONOSCOPY;  Surgeon: Ladene Artist, MD;  Location: [facility] ENDOSCOPY;  Service: Endoscopy;  Laterality: N/A;  . LAPAROSCOPIC SIGMOID COLECTOMY N/A 08/08/2014   Procedure: LAPAROSCOPIC SIGMOID COLECTOMY [facility] PROCTOSCOPY;  Surgeon: Excell Seltzer, MD;  Location: [facility] ORS;  Service: General;  Laterality: N/A;  . TEE WITHOUT CARDIOVERSION N/A 09/06/2012   Procedure: TRANSESOPHAGEAL ECHOCARDIOGRAM (TEE);  Surgeon: [name], MD;  Location: [facility]  ENDOSCOPY;  Service: Cardiovascular;  Laterality: N/A;  . TEE WITHOUT CARDIOVERSION N/A 11/01/2012   Procedure: TRANSESOPHAGEAL ECHOCARDIOGRAM (TEE);  Surgeon: [name], MD;  Location[facility] ENDOSCOPY;  Service: Cardiovascular;  Laterality: N/A;  . TUBAL LIGATION       Current Outpatient Prescriptions  Medication Sig Dispense Refill  . amiodarone (PACERONE) 200 MG tablet Take 1 tablet (200 mg total) by mouth daily. 90 tablet 2  . amitriptyline (ELAVIL) 25 MG tablet Take 25 mg by mouth at bedtime.   3  . atorvastatin (LIPITOR) 10 MG tablet Take 10 mg by mouth daily.    Marland Kitchen CARTIA XT 120 MG 24 hr capsule TAKE 1 CAPSULE BY MOUTH DAILY 90 capsule 1  . cetirizine (ZYRTEC) 10 MG tablet Take 10 mg by mouth daily.    . CHOLECALCIFEROL PO Take 1 tablet by mouth daily.    Marland Kitchen ELIQUIS 5 MG TABS tablet TAKE 1 TABLET BY MOUTH TWICE DAILY. 180 tablet 1  . famotidine (PEPCID) 20 MG tablet Take 20 mg by mouth 2 (two) times daily.    . furosemide (LASIX) 40 MG tablet Take 1 tablet (40 mg total) by mouth daily. PLEASE CONTACT OFFICE FOR ADDITIONAL REFILLS (Patient taking differently: Take 40 mg by mouth daily. ) 15 tablet 0  . gabapentin (NEURONTIN) 300 MG capsule Take 300 mg by mouth as needed.     Marland Kitchen HUMIRA PEN 40 MG/0.8ML PNKT     . HYDROcodone-acetaminophen (NORCO/VICODIN) 5-325 MG per tablet Take 1 tablet by mouth every 6 (six) hours as needed (pain).     . Insulin Syringe-Needle U-100 (INSULIN SYRINGE 1CC/31GX5/16") 31G X 5/16" 1 ML MISC   0  . Lancets (ACCU-CHEK MULTICLIX) lancets   11  . Multiple Vitamin (MULTIVITAMIN WITH MINERALS) TABS Take 1 tablet by mouth daily.    Marland Kitchen NOVOLIN 70/30 RELION (70-30) 100 UNIT/ML injection Inject 35-60 Units into the skin 2 (two) times daily with a meal. 60 units in the morning and 35 units in the evening    . oxyCODONE-acetaminophen (ROXICET) 5-325 MG per tablet Take 1 tablet by mouth every 4 (four) hours as needed. 30 tablet 0  . potassium chloride SA (K-DUR,KLOR-CON)  20 MEQ tablet Take 1 tablet (20 mEq total) by mouth daily. 30 tablet 6  . sertraline (ZOLOFT) 100 MG tablet Take 100 mg by mouth at bedtime.   1  . traZODone (DESYREL) 50 MG tablet Take 50 mg by mouth at bedtime.     No current facility-administered medications for this encounter.     Allergies:   Benicar [olmesartan]; Latex; Other; and Tikosyn [dofetilide]   Social History:  The patient  reports that she quit smoking about 22 years ago. She has never used smokeless tobacco. She reports that she does not drink alcohol or use drugs.   Family History:  The patient's  family history includes Diabetes type II in her father; Heart attack in her maternal grandfather, maternal  grandmother, mother, and paternal grandmother.    ROS:  Please see the history of present illness.   All other systems are reviewed and negative.    PHYSICAL EXAM: VS:  BP (!) 142/82 (BP Location: Left Arm, Patient Position: Sitting, Cuff Size: Large)   Pulse (!) 116   Ht 5\' 3"  (1.6 m)   Wt 220 lb 3.2 oz (99.9 kg)   BMI 39.01 kg/m  , BMI Body mass index is 39.01 kg/m. GEN: Well nourished, well developed, in no acute distress  HEENT: normal  Neck: no JVD, carotid bruits, or masses Cardiac: IRRR; no murmurs, rubs, or gallops,no edema  Respiratory:  clear to auscultation bilaterally,few crackles rt lung base GI: soft, nontender, nondistended, + BS MS: no deformity or atrophy  Skin: warm and dry  Neuro:  Strength and sensation are intact Psych: euthymic mood, full affect  EKG: atrial flutter at 116 bpm, pr int 202 ms, qrs int 132 ms, qtc 586 ms    Lipid Panel     Wt Readings from Last 3 Encounters:  01/27/17 220 lb 3.2 oz (99.9 kg)  01/19/17 222 lb (100.7 kg)  01/13/17 222 lb 6.4 oz (100.9 kg)      ASSESSMENT AND PLAN:  1.  Atrial fibrillation/flutter Successful cardioversion but with ERAF Symptomatic with fatigue, H/A, lightheadedness I feel is from RVR Continue amiodarone 200 mg one a  day Increase cartia 120 mg to bid for better rate control, please call office to inform of HR and BP in a few days Continue apixaban 5 mg bid  Update echo Discussed with Dr. [name] and she may be a repeat ablation candidate fi amiodarone fails to restore SR Will request to see him with updated echo results in a few weeks  2. HTN Stable  Avoid salt  3. Obestiy Struggles with weight gain States she is sedentary     F/u wwith Dr. [name] in August    Alison Bell, [facility] [address] [place], [place] [address] [phone]

## 2017-01-29 ENCOUNTER — Encounter (INDEPENDENT_AMBULATORY_CARE_PROVIDER_SITE_OTHER): Payer: PPO | Admitting: Ophthalmology

## 2017-02-06 ENCOUNTER — Ambulatory Visit (HOSPITAL_COMMUNITY)
Admission: RE | Admit: 2017-02-06 | Discharge: 2017-02-06 | Disposition: A | Discharge status: Home / Self Care | Payer: PPO | Source: Ambulatory Visit | Attending: Nurse Practitioner | Admitting: Nurse Practitioner

## 2017-02-06 DIAGNOSIS — I481 Persistent atrial fibrillation: Secondary | ICD-10-CM | POA: Diagnosis present

## 2017-02-06 DIAGNOSIS — E119 Type 2 diabetes mellitus without complications: Secondary | ICD-10-CM | POA: Insufficient documentation

## 2017-02-06 DIAGNOSIS — I4819 Other persistent atrial fibrillation: Secondary | ICD-10-CM

## 2017-02-06 DIAGNOSIS — E785 Hyperlipidemia, unspecified: Secondary | ICD-10-CM | POA: Insufficient documentation

## 2017-02-06 DIAGNOSIS — I081 Rheumatic disorders of both mitral and tricuspid valves: Secondary | ICD-10-CM | POA: Insufficient documentation

## 2017-02-06 DIAGNOSIS — I10 Essential (primary) hypertension: Secondary | ICD-10-CM | POA: Insufficient documentation

## 2017-02-06 LAB — ECHOCARDIOGRAM COMPLETE
E decel time: 176 msec
E/e' ratio: 11.81
FS: 23 % — AB (ref 28–44)
IVS/LV PW RATIO, ED: 1.09
LA ID, A-P, ES: 42 mm
LA diam end sys: 42 mm
LA diam index: 2.09 cm/m2
LA vol A4C: 54 ml
LA vol index: 31.2 mL/m2
LA vol: 62.7 mL
LV E/e' medial: 11.81
LV E/e'average: 11.81
LV PW d: 11.7 mm — AB (ref 0.6–1.1)
LV e' LATERAL: 9.74 cm/s
LVOT SV: 39 mL
LVOT VTI: 19.3 cm
LVOT area: 2.01 cm2
LVOT diameter: 16 mm
LVOT peak grad rest: 5 mmHg
LVOT peak vel: 113 cm/s
Lateral S' vel: 10.7 cm/s
MV Dec: 176
MV Peak grad: 5 mmHg
MV pk A vel: 37.9 m/s
MV pk E vel: 115 m/s
RV sys press: 44 mmHg
Reg peak vel: 321 cm/s
TAPSE: 20.8 mm
TDI e' lateral: 9.74
TDI e' medial: 8.48
TR max vel: 321 cm/s

## 2017-02-06 NOTE — Progress Notes (Signed)
  Echocardiogram 2D Echocardiogram has been performed.  Alison Bell 02/06/2017, 10:05 AM

## 2017-02-12 ENCOUNTER — Telehealth (HOSPITAL_COMMUNITY): Payer: Self-pay | Admitting: *Deleted

## 2017-02-12 NOTE — Telephone Encounter (Signed)
Pt spouse called reporting that on Tuesday, the pt suffered a "severe nose bleed that left the bedding covered" She held the bridge of her nose with a paper towel to stop the bleeding but she wanted to make sure that the blood thinner was still safe to take.  She had another nose bleed yesterday that also took a while to contain.  Per Roderic Palau, NP pt should contact her ENT to make appt, use Saline and avoid blowing her nose.  If it starts gushing or is uncontrollable she should go to the ER

## 2017-02-17 DIAGNOSIS — Z0000 Encounter for general adult medical examination without abnormal findings: Secondary | ICD-10-CM | POA: Diagnosis not present

## 2017-02-17 DIAGNOSIS — I10 Essential (primary) hypertension: Secondary | ICD-10-CM | POA: Diagnosis not present

## 2017-02-17 DIAGNOSIS — E1165 Type 2 diabetes mellitus with hyperglycemia: Secondary | ICD-10-CM | POA: Diagnosis not present

## 2017-02-17 DIAGNOSIS — E7800 Pure hypercholesterolemia, unspecified: Secondary | ICD-10-CM | POA: Diagnosis not present

## 2017-02-19 ENCOUNTER — Other Ambulatory Visit (HOSPITAL_COMMUNITY): Payer: Self-pay | Admitting: Nurse Practitioner

## 2017-02-20 DIAGNOSIS — Z0001 Encounter for general adult medical examination with abnormal findings: Secondary | ICD-10-CM | POA: Diagnosis not present

## 2017-02-20 DIAGNOSIS — I4891 Unspecified atrial fibrillation: Secondary | ICD-10-CM | POA: Diagnosis not present

## 2017-02-20 DIAGNOSIS — I5030 Unspecified diastolic (congestive) heart failure: Secondary | ICD-10-CM | POA: Diagnosis not present

## 2017-02-20 DIAGNOSIS — I4510 Unspecified right bundle-branch block: Secondary | ICD-10-CM | POA: Diagnosis not present

## 2017-02-20 DIAGNOSIS — E0590 Thyrotoxicosis, unspecified without thyrotoxic crisis or storm: Secondary | ICD-10-CM | POA: Diagnosis not present

## 2017-02-20 DIAGNOSIS — N390 Urinary tract infection, site not specified: Secondary | ICD-10-CM | POA: Diagnosis not present

## 2017-02-20 DIAGNOSIS — I10 Essential (primary) hypertension: Secondary | ICD-10-CM | POA: Diagnosis not present

## 2017-02-20 DIAGNOSIS — N183 Chronic kidney disease, stage 3 (moderate): Secondary | ICD-10-CM | POA: Diagnosis not present

## 2017-02-20 DIAGNOSIS — K219 Gastro-esophageal reflux disease without esophagitis: Secondary | ICD-10-CM | POA: Diagnosis not present

## 2017-02-20 DIAGNOSIS — J309 Allergic rhinitis, unspecified: Secondary | ICD-10-CM | POA: Diagnosis not present

## 2017-02-20 DIAGNOSIS — E7800 Pure hypercholesterolemia, unspecified: Secondary | ICD-10-CM | POA: Diagnosis not present

## 2017-02-20 DIAGNOSIS — E1122 Type 2 diabetes mellitus with diabetic chronic kidney disease: Secondary | ICD-10-CM | POA: Diagnosis not present

## 2017-02-20 DIAGNOSIS — M0579 Rheumatoid arthritis with rheumatoid factor of multiple sites without organ or systems involvement: Secondary | ICD-10-CM | POA: Diagnosis not present

## 2017-02-23 ENCOUNTER — Encounter: Payer: Self-pay | Admitting: Internal Medicine

## 2017-02-23 ENCOUNTER — Ambulatory Visit (INDEPENDENT_AMBULATORY_CARE_PROVIDER_SITE_OTHER): Payer: PPO | Admitting: Internal Medicine

## 2017-02-23 VITALS — BP 128/70 | HR 60 | Ht 62.5 in | Wt 223.0 lb

## 2017-02-23 DIAGNOSIS — I4819 Other persistent atrial fibrillation: Secondary | ICD-10-CM

## 2017-02-23 DIAGNOSIS — I481 Persistent atrial fibrillation: Secondary | ICD-10-CM | POA: Diagnosis not present

## 2017-02-23 NOTE — Patient Instructions (Addendum)
Medication Instructions:  Your physician recommends that you continue on your current medications as directed. Please refer to the Current Medication list given to you today.   Labwork: Your physician recommends that you return for lab work on 03/05/17 at 10am---you do not have to fast   Testing/Procedures:   Your physician has requested that you have cardiac CT. Cardiac computed tomography (CT) is a painless test that uses an x-ray machine to take clear, detailed pictures of your heart. For further information please visit [email or web address]. Please follow instruction sheet as given.----week of 03/09/17.  Office will call after your insurance has approved to schedule   Your physician has recommended that you have an ablation. Catheter ablation is a medical procedure used to treat some cardiac arrhythmias (irregular heartbeats). During catheter ablation, a long, thin, flexible tube is put into a blood vessel in your groin (upper thigh), or neck. This tube is called an ablation catheter. It is then guided to your heart through the blood vessel. Radio frequency waves destroy small areas of heart tissue where abnormal heartbeats may cause an arrhythmia to start. Please see the instruction sheet given to you today.---03/17/17  Please arrive at [facility] at 5:30am Do not eat or drink after midnight the night prior to the procedure Do not take any medications the morning of the test Plan for one night stay Will need someone to drive you home at discharge       Follow-Up: Your physician recommends that you schedule a follow-up appointment in: 4 weeks from 03/17/17 with Roderic Palau, NP and 3 months from 03/17/17 with Dr [name]   Any Other Special Instructions Will Be Listed Below (If Applicable).     If you need a refill on your cardiac medications before your next appointment, please call your pharmacy.

## 2017-02-23 NOTE — Progress Notes (Signed)
 PCP: Pharr, Walter, MD Primary Cardiologist: Dr Berry Primary EP: Dr Zora Glendenning  Alison Bell is a 71 y.o. female who presents today for routine electrophysiology followup.  She has had chronic difficulty with afib.  She has recently failed cardioversion despite amiodarone.  She has SOB and fatigue which she attributes to her afib. Today, she denies symptoms of palpitations, chest pain,  lower extremity edema, dizziness, presyncope, or syncope.  The patient is otherwise without complaint today.   Past Medical History:  Diagnosis Date  . Anemia   . Arthritis    rheumatoid  . Colon adenoma   . Depression   . Diabetes mellitus without complication (HCC)   . Difficulty sleeping   . GERD (gastroesophageal reflux disease)   . Headache    migraines  . History of blood transfusion   . HTN (hypertension)   . Hyperlipidemia   . LV dysfunction, EF 45-50% due to a. fib per echo 08/27/12  08/28/2012   a. Echo (10/15):  EF 55-60%, no RWMA, Gr 2 DD, mild MR, mod LAE, normal RVF, mild RAE, mild TR (LA 44 mm)  . Persistent atrial fibrillation (HCC)   . Torsades de pointes (HCC)    due to Tikosyn 04/2014   Past Surgical History:  Procedure Laterality Date  . ATRIAL FIBRILLATION ABLATION N/A 11/02/2012   PVI by Dr Brealyn Baril  . CARDIOVERSION N/A 09/06/2012   Procedure: CARDIOVERSION;  Surgeon: Kenneth C. Hilty, MD;  Location: [facility];  Service: Cardiovascular;  Laterality: N/A;  . CARDIOVERSION N/A 01/07/2013   Procedure: CARDIOVERSION;  Surgeon: Mihai Croitoru, MD;  Location: [facility];  Service: Cardiovascular;  Laterality: N/A;  . CARDIOVERSION N/A 11/14/2013   Procedure: CARDIOVERSION;  Surgeon: Kenneth C. Hilty, MD;  Location: [facility];  Service: Cardiovascular;  Laterality: N/A;  . CARDIOVERSION N/A 01/19/2017   Procedure: CARDIOVERSION;  Surgeon: Hilty, Kenneth C, MD;  Location: [facility];  Service: Cardiovascular;  Laterality: N/A;  . COLONOSCOPY N/A 07/28/2014   Procedure: COLONOSCOPY;   Surgeon: Malcolm T Stark, MD;  Location: [facility];  Service: Endoscopy;  Laterality: N/A;  . LAPAROSCOPIC SIGMOID COLECTOMY N/A 08/08/2014   Procedure: LAPAROSCOPIC SIGMOID COLECTOMY /RIGID PROCTOSCOPY;  Surgeon: Benjamin Hoxworth, MD;  Location: [facility];  Service: General;  Laterality: N/A;  . TEE WITHOUT CARDIOVERSION N/A 09/06/2012   Procedure: TRANSESOPHAGEAL ECHOCARDIOGRAM (TEE);  Surgeon: Kenneth C. Hilty, MD;  [facility];  Service: Cardiovascular;  Laterality: N/A;  . TEE WITHOUT CARDIOVERSION N/A 11/01/2012   Procedure: TRANSESOPHAGEAL ECHOCARDIOGRAM (TEE);  Surgeon: Peter M Jordan, MD;  Location: [facility];  Service: Cardiovascular;  Laterality: N/A;  . TUBAL LIGATION      ROS- all systems are reviewed and negatives except as per HPI above  Current Outpatient Prescriptions  Medication Sig Dispense Refill  . amiodarone (PACERONE) 200 MG tablet Take 1 tablet (200 mg total) by mouth daily. 90 tablet 2  . amitriptyline (ELAVIL) 25 MG tablet Take 25 mg by mouth at bedtime.   3  . atorvastatin (LIPITOR) 10 MG tablet Take 10 mg by mouth daily.    . cetirizine (ZYRTEC) 10 MG tablet Take 10 mg by mouth daily.    . CHOLECALCIFEROL PO Take 1 tablet by mouth daily.    . diltiazem (CARTIA XT) 120 MG 24 hr capsule Take 1 capsule (120 mg total) by mouth 2 (two) times daily. 180 capsule 2  . ELIQUIS 5 MG TABS tablet TAKE 1 TABLET BY MOUTH TWICE DAILY. 180 tablet 1  .   famotidine (PEPCID) 20 MG tablet Take 20 mg by mouth 2 (two) times daily.    . furosemide (LASIX) 40 MG tablet Take 1 tablet (40 mg total) by mouth daily. PLEASE CONTACT OFFICE FOR ADDITIONAL REFILLS (Patient taking differently: Take 40 mg by mouth daily. ) 15 tablet 0  . gabapentin (NEURONTIN) 300 MG capsule Take 300 mg by mouth as needed.     . HUMIRA PEN 40 MG/0.8ML PNKT     . HYDROcodone-acetaminophen (NORCO/VICODIN) 5-325 MG per tablet Take 1 tablet by mouth every 6 (six) hours as needed (pain).     . Insulin  Syringe-Needle U-100 (INSULIN SYRINGE 1CC/31GX5/16") 31G X 5/16" 1 ML MISC   0  . Lancets (ACCU-CHEK MULTICLIX) lancets   11  . Multiple Vitamin (MULTIVITAMIN WITH MINERALS) TABS Take 1 tablet by mouth daily.    . NOVOLIN 70/30 RELION (70-30) 100 UNIT/ML injection Inject 35-60 Units into the skin 2 (two) times daily with a meal. 60 units in the morning and 35 units in the evening    . oxyCODONE-acetaminophen (ROXICET) 5-325 MG per tablet Take 1 tablet by mouth every 4 (four) hours as needed. 30 tablet 0  . potassium chloride SA (K-DUR,KLOR-CON) 20 MEQ tablet Take 1 tablet (20 mEq total) by mouth daily. 30 tablet 6  . sertraline (ZOLOFT) 100 MG tablet Take one and a half tablets (150 mg) by mouth daily  1  . traZODone (DESYREL) 50 MG tablet Take 50 mg by mouth at bedtime.     No current facility-administered medications for this visit.     Physical Exam: Vitals:   02/23/17 1542  BP: 128/70  Pulse: 60  SpO2: 94%  Weight: 223 lb (101.2 kg)  Height: 5' 2.5" (1.588 m)    GEN- The patient is morbidly obese appearing, alert and oriented x 3 today.   Head- normocephalic, atraumatic Eyes-  Sclera clear, conjunctiva pink Ears- hearing intact Oropharynx- clear Lungs- Clear to ausculation bilaterally, normal work of breathing Heart- irregular rate and rhythm, no murmurs, rubs or gallops, PMI not laterally displaced GI- soft, NT, ND, + BS Extremities- no clubbing, cyanosis, or edema  EKG tracing ordered today is personally reviewed and shows atypical atrial flutter, RBBB  Assessment and Plan:  1. Persistent afib Symptomatic Refractory to amiodarone. Prior ablation 2014 Therapeutic strategies for afib including rate control or ablation were discussed in detail with the patient today. Risk, benefits, and alternatives to EP study and radiofrequency ablation for afib were also discussed in detail today. These risks include but are not limited to stroke, bleeding, vascular damage, tamponade,  perforation, damage to the esophagus, lungs, and other structures, pulmonary vein stenosis, worsening renal function, and death. The patient understands these risk and wishes to proceed.   I have informed her that anticipated success rates would be low (50-60%).  She is clear that she would like to proceed. Cardiac CT is ordered prior to ablation to exclude pulmonary vein stenosis and LAA thrombus.   2. Obesity Body mass index is 40.14 kg/m. I have reviewed the patients BMI and decreased success rates with ablation at length today.  Weight loss is strongly advised.  Per Guijian et al (PACE 2013; 36: 748-756), patients with BMI 25-29.9 (obese) have a 27% increase in AF recurrence post ablation.  Patients with BMI >30 have a 31% increase in AF recurrence post ablation when compared to those with BMI <25.  3. Hypertensive cardiovascular disease Stable No change required today    Jaycee Mckellips MD,   FACC 02/23/2017 3:54 PM  

## 2017-03-05 ENCOUNTER — Other Ambulatory Visit: Payer: Self-pay | Admitting: *Deleted

## 2017-03-05 ENCOUNTER — Other Ambulatory Visit: Payer: PPO | Admitting: *Deleted

## 2017-03-05 ENCOUNTER — Other Ambulatory Visit: Payer: PPO

## 2017-03-05 ENCOUNTER — Telehealth: Payer: Self-pay | Admitting: Internal Medicine

## 2017-03-05 DIAGNOSIS — I4819 Other persistent atrial fibrillation: Secondary | ICD-10-CM

## 2017-03-05 DIAGNOSIS — I480 Paroxysmal atrial fibrillation: Secondary | ICD-10-CM

## 2017-03-05 DIAGNOSIS — I481 Persistent atrial fibrillation: Secondary | ICD-10-CM | POA: Diagnosis not present

## 2017-03-05 LAB — CBC WITH DIFFERENTIAL/PLATELET
Basophils Absolute: 0 10*3/uL (ref 0.0–0.2)
Basos: 0 %
EOS (ABSOLUTE): 0.2 10*3/uL (ref 0.0–0.4)
Eos: 2 %
Hematocrit: 40 % (ref 34.0–46.6)
Hemoglobin: 13.6 g/dL (ref 11.1–15.9)
Immature Grans (Abs): 0 10*3/uL (ref 0.0–0.1)
Immature Granulocytes: 0 %
Lymphocytes Absolute: 3 10*3/uL (ref 0.7–3.1)
Lymphs: 32 %
MCH: 31.3 pg (ref 26.6–33.0)
MCHC: 34 g/dL (ref 31.5–35.7)
MCV: 92 fL (ref 79–97)
Monocytes Absolute: 0.7 10*3/uL (ref 0.1–0.9)
Monocytes: 7 %
Neutrophils Absolute: 5.6 10*3/uL (ref 1.4–7.0)
Neutrophils: 59 %
Platelets: 374 10*3/uL (ref 150–379)
RBC: 4.35 x10E6/uL (ref 3.77–5.28)
RDW: 14.4 % (ref 12.3–15.4)
WBC: 9.5 10*3/uL (ref 3.4–10.8)

## 2017-03-05 LAB — BASIC METABOLIC PANEL
BUN/Creatinine Ratio: 20 (ref 12–28)
BUN: 23 mg/dL (ref 8–27)
CO2: 24 mmol/L (ref 20–29)
Calcium: 9.7 mg/dL (ref 8.7–10.3)
Chloride: 98 mmol/L (ref 96–106)
Creatinine, Ser: 1.14 mg/dL — ABNORMAL HIGH (ref 0.57–1.00)
GFR calc Af Amer: 55 mL/min/{1.73_m2} — ABNORMAL LOW (ref >59)
GFR calc non Af Amer: 48 mL/min/{1.73_m2} — ABNORMAL LOW (ref >59)
Glucose: 196 mg/dL — ABNORMAL HIGH (ref 65–99)
Potassium: 4.2 mmol/L (ref 3.5–5.2)
Sodium: 140 mmol/L (ref 134–144)

## 2017-03-11 ENCOUNTER — Encounter: Payer: Self-pay | Admitting: Internal Medicine

## 2017-03-12 ENCOUNTER — Other Ambulatory Visit: Payer: Self-pay | Admitting: Internal Medicine

## 2017-03-12 ENCOUNTER — Ambulatory Visit (HOSPITAL_COMMUNITY): Payer: PPO

## 2017-03-12 ENCOUNTER — Ambulatory Visit (HOSPITAL_COMMUNITY)
Admission: RE | Admit: 2017-03-12 | Discharge: 2017-03-12 | Disposition: A | Discharge status: Home / Self Care | Payer: PPO | Source: Ambulatory Visit | Attending: Internal Medicine | Admitting: Internal Medicine

## 2017-03-12 DIAGNOSIS — I480 Paroxysmal atrial fibrillation: Secondary | ICD-10-CM

## 2017-03-12 DIAGNOSIS — I4891 Unspecified atrial fibrillation: Secondary | ICD-10-CM | POA: Diagnosis not present

## 2017-03-12 DIAGNOSIS — R918 Other nonspecific abnormal finding of lung field: Secondary | ICD-10-CM | POA: Insufficient documentation

## 2017-03-12 MED ORDER — NITROGLYCERIN 0.4 MG SL SUBL
0.4000 mg | SUBLINGUAL_TABLET | SUBLINGUAL | Status: DC | PRN
  Administered 2017-03-12: 0.4 mg via SUBLINGUAL

## 2017-03-12 MED ORDER — METOPROLOL TARTRATE 5 MG/5ML IV SOLN
5.0000 mg | Freq: Once | INTRAVENOUS | Status: AC
  Administered 2017-03-12: 5 mg via INTRAVENOUS

## 2017-03-12 MED ORDER — METOPROLOL TARTRATE 5 MG/5ML IV SOLN
INTRAVENOUS | Status: AC
  Filled 2017-03-12: qty 5

## 2017-03-12 MED ORDER — NITROGLYCERIN 0.4 MG SL SUBL
SUBLINGUAL_TABLET | SUBLINGUAL | Status: AC
  Filled 2017-03-12: qty 1

## 2017-03-12 MED ORDER — IOPAMIDOL (ISOVUE-370) INJECTION 76%
INTRAVENOUS | Status: AC
  Administered 2017-03-12: 100 mL
  Filled 2017-03-12: qty 100

## 2017-03-12 NOTE — Progress Notes (Signed)
CT scan completed. Tolerated well. D/C home walking with daughter. Awake and alert. In no distress.

## 2017-03-13 ENCOUNTER — Ambulatory Visit (HOSPITAL_COMMUNITY): Payer: PPO

## 2017-03-17 ENCOUNTER — Ambulatory Visit (HOSPITAL_COMMUNITY): Payer: PPO | Admitting: Certified Registered Nurse Anesthetist

## 2017-03-17 ENCOUNTER — Inpatient Hospital Stay (HOSPITAL_COMMUNITY)
Admission: RE | Disposition: A | Discharge status: Home / Self Care | Payer: Self-pay | Source: Ambulatory Visit | Attending: Internal Medicine

## 2017-03-17 ENCOUNTER — Inpatient Hospital Stay (HOSPITAL_COMMUNITY)
Admission: RE | Admit: 2017-03-17 | Discharge: 2017-03-20 | DRG: 274 | Disposition: A | Discharge status: Home / Self Care | Payer: PPO | Source: Ambulatory Visit | Attending: Internal Medicine | Admitting: Internal Medicine

## 2017-03-17 ENCOUNTER — Encounter (HOSPITAL_COMMUNITY): Payer: Self-pay | Admitting: Certified Registered Nurse Anesthetist

## 2017-03-17 DIAGNOSIS — I481 Persistent atrial fibrillation: Secondary | ICD-10-CM | POA: Diagnosis not present

## 2017-03-17 DIAGNOSIS — I119 Hypertensive heart disease without heart failure: Secondary | ICD-10-CM | POA: Diagnosis present

## 2017-03-17 DIAGNOSIS — Z7901 Long term (current) use of anticoagulants: Secondary | ICD-10-CM | POA: Diagnosis not present

## 2017-03-17 DIAGNOSIS — Z9049 Acquired absence of other specified parts of digestive tract: Secondary | ICD-10-CM

## 2017-03-17 DIAGNOSIS — E119 Type 2 diabetes mellitus without complications: Secondary | ICD-10-CM | POA: Diagnosis not present

## 2017-03-17 DIAGNOSIS — Z6841 Body Mass Index (BMI) 40.0 and over, adult: Secondary | ICD-10-CM

## 2017-03-17 DIAGNOSIS — K219 Gastro-esophageal reflux disease without esophagitis: Secondary | ICD-10-CM | POA: Diagnosis not present

## 2017-03-17 DIAGNOSIS — R0602 Shortness of breath: Secondary | ICD-10-CM | POA: Diagnosis not present

## 2017-03-17 DIAGNOSIS — Z79899 Other long term (current) drug therapy: Secondary | ICD-10-CM

## 2017-03-17 DIAGNOSIS — Z794 Long term (current) use of insulin: Secondary | ICD-10-CM | POA: Diagnosis not present

## 2017-03-17 DIAGNOSIS — I10 Essential (primary) hypertension: Secondary | ICD-10-CM | POA: Diagnosis not present

## 2017-03-17 DIAGNOSIS — Z713 Dietary counseling and surveillance: Secondary | ICD-10-CM | POA: Diagnosis not present

## 2017-03-17 DIAGNOSIS — I4891 Unspecified atrial fibrillation: Secondary | ICD-10-CM | POA: Diagnosis not present

## 2017-03-17 DIAGNOSIS — E785 Hyperlipidemia, unspecified: Secondary | ICD-10-CM | POA: Diagnosis not present

## 2017-03-17 DIAGNOSIS — I4892 Unspecified atrial flutter: Secondary | ICD-10-CM | POA: Diagnosis not present

## 2017-03-17 DIAGNOSIS — I5033 Acute on chronic diastolic (congestive) heart failure: Secondary | ICD-10-CM | POA: Diagnosis not present

## 2017-03-17 DIAGNOSIS — I110 Hypertensive heart disease with heart failure: Secondary | ICD-10-CM | POA: Diagnosis not present

## 2017-03-17 DIAGNOSIS — Z87891 Personal history of nicotine dependence: Secondary | ICD-10-CM

## 2017-03-17 DIAGNOSIS — F329 Major depressive disorder, single episode, unspecified: Secondary | ICD-10-CM | POA: Diagnosis present

## 2017-03-17 DIAGNOSIS — I4819 Other persistent atrial fibrillation: Secondary | ICD-10-CM | POA: Diagnosis present

## 2017-03-17 DIAGNOSIS — E8770 Fluid overload, unspecified: Secondary | ICD-10-CM | POA: Diagnosis not present

## 2017-03-17 HISTORY — PX: ATRIAL FIBRILLATION ABLATION: EP1191

## 2017-03-17 LAB — POCT ACTIVATED CLOTTING TIME
Activated Clotting Time: 257 seconds
Activated Clotting Time: 274 seconds
Activated Clotting Time: 285 seconds
Activated Clotting Time: 158 seconds

## 2017-03-17 LAB — GLUCOSE, CAPILLARY
Glucose-Capillary: 194 mg/dL — ABNORMAL HIGH (ref 65–99)
Glucose-Capillary: 184 mg/dL — ABNORMAL HIGH (ref 65–99)
Glucose-Capillary: 263 mg/dL — ABNORMAL HIGH (ref 65–99)
Glucose-Capillary: 181 mg/dL — ABNORMAL HIGH (ref 65–99)

## 2017-03-17 SURGERY — ATRIAL FIBRILLATION ABLATION
Anesthesia: General

## 2017-03-17 MED ORDER — MIDAZOLAM HCL 5 MG/5ML IJ SOLN
INTRAMUSCULAR | Status: DC | PRN
  Administered 2017-03-17: 2 mg via INTRAVENOUS

## 2017-03-17 MED ORDER — SUCCINYLCHOLINE CHLORIDE 20 MG/ML IJ SOLN
INTRAMUSCULAR | Status: DC | PRN
  Administered 2017-03-17: 120 mg via INTRAVENOUS

## 2017-03-17 MED ORDER — SODIUM CHLORIDE 0.9% FLUSH
3.0000 mL | INTRAVENOUS | Status: DC | PRN
Start: 2017-03-17 — End: 2017-03-20

## 2017-03-17 MED ORDER — SODIUM CHLORIDE 0.9 % IV SOLN
INTRAVENOUS | Status: DC
  Administered 2017-03-17: 07:00:00 via INTRAVENOUS

## 2017-03-17 MED ORDER — PROPOFOL 500 MG/50ML IV EMUL
INTRAVENOUS | Status: DC | PRN
  Administered 2017-03-17: 50 ug/kg/min via INTRAVENOUS

## 2017-03-17 MED ORDER — HYDROCODONE-ACETAMINOPHEN 5-325 MG PO TABS: 1.0000 | ORAL_TABLET | ORAL | Status: DC | PRN

## 2017-03-17 MED ORDER — IOPAMIDOL (ISOVUE-370) INJECTION 76%
INTRAVENOUS | Status: DC | PRN
  Administered 2017-03-17: 3 mL

## 2017-03-17 MED ORDER — INSULIN ASPART PROT & ASPART (70-30 MIX) 100 UNIT/ML ~~LOC~~ SUSP
35.0000 [IU] | Freq: Every day | SUBCUTANEOUS | Status: DC
  Administered 2017-03-17 – 2017-03-19 (×3): 35 [IU] via SUBCUTANEOUS
  Filled 2017-03-17 (×2): qty 10

## 2017-03-17 MED ORDER — ONDANSETRON HCL 4 MG/2ML IJ SOLN
INTRAMUSCULAR | Status: DC | PRN
  Administered 2017-03-17: 4 mg via INTRAVENOUS

## 2017-03-17 MED ORDER — HEPARIN (PORCINE) IN NACL 2-0.9 UNIT/ML-% IJ SOLN
INTRAMUSCULAR | Status: AC | PRN
  Administered 2017-03-17: 500 mL

## 2017-03-17 MED ORDER — HEPARIN SODIUM (PORCINE) 1000 UNIT/ML IJ SOLN
INTRAMUSCULAR | Status: DC | PRN
  Administered 2017-03-17: 3000 [IU] via INTRAVENOUS
  Administered 2017-03-17: 4000 [IU] via INTRAVENOUS
  Administered 2017-03-17: 12000 [IU] via INTRAVENOUS

## 2017-03-17 MED ORDER — POTASSIUM CHLORIDE CRYS ER 20 MEQ PO TBCR
20.0000 meq | EXTENDED_RELEASE_TABLET | Freq: Every day | ORAL | Status: DC
  Administered 2017-03-18 – 2017-03-19 (×2): 20 meq via ORAL
  Filled 2017-03-17 (×2): qty 1

## 2017-03-17 MED ORDER — ROCURONIUM BROMIDE 10 MG/ML (PF) SYRINGE
PREFILLED_SYRINGE | INTRAVENOUS | Status: DC | PRN
  Administered 2017-03-17: 60 mg via INTRAVENOUS

## 2017-03-17 MED ORDER — TRAZODONE HCL 50 MG PO TABS
50.0000 mg | ORAL_TABLET | Freq: Every day | ORAL | Status: DC
  Administered 2017-03-17 – 2017-03-19 (×3): 50 mg via ORAL
  Filled 2017-03-17 (×3): qty 1

## 2017-03-17 MED ORDER — NALOXONE HCL 0.4 MG/ML IJ SOLN
INTRAMUSCULAR | Status: DC | PRN
  Administered 2017-03-17 (×2): 40 ug via INTRAVENOUS

## 2017-03-17 MED ORDER — LACTATED RINGERS IV SOLN: INTRAVENOUS | Status: DC | PRN

## 2017-03-17 MED ORDER — SODIUM CHLORIDE 0.9% FLUSH
3.0000 mL | Freq: Two times a day (BID) | INTRAVENOUS | Status: DC
  Administered 2017-03-17 – 2017-03-20 (×6): 3 mL via INTRAVENOUS

## 2017-03-17 MED ORDER — SUGAMMADEX SODIUM 200 MG/2ML IV SOLN
INTRAVENOUS | Status: DC | PRN
  Administered 2017-03-17: 199.6 mg via INTRAVENOUS

## 2017-03-17 MED ORDER — PROTAMINE SULFATE 10 MG/ML IV SOLN
INTRAVENOUS | Status: DC | PRN
  Administered 2017-03-17: 30 mg via INTRAVENOUS

## 2017-03-17 MED ORDER — INSULIN ASPART 100 UNIT/ML ~~LOC~~ SOLN
0.0000 [IU] | Freq: Three times a day (TID) | SUBCUTANEOUS | Status: DC
  Administered 2017-03-18: 2 [IU] via SUBCUTANEOUS
  Administered 2017-03-18: 1 [IU] via SUBCUTANEOUS
  Administered 2017-03-18: 3 [IU] via SUBCUTANEOUS
  Administered 2017-03-19 (×2): 1 [IU] via SUBCUTANEOUS
  Administered 2017-03-19 – 2017-03-20 (×2): 2 [IU] via SUBCUTANEOUS

## 2017-03-17 MED ORDER — HEPARIN SODIUM (PORCINE) 1000 UNIT/ML IJ SOLN
INTRAMUSCULAR | Status: AC
  Filled 2017-03-17: qty 1

## 2017-03-17 MED ORDER — IOPAMIDOL (ISOVUE-370) INJECTION 76%
INTRAVENOUS | Status: AC
  Filled 2017-03-17: qty 50

## 2017-03-17 MED ORDER — HEPARIN (PORCINE) IN NACL 2-0.9 UNIT/ML-% IJ SOLN
INTRAMUSCULAR | Status: AC
  Filled 2017-03-17: qty 500

## 2017-03-17 MED ORDER — APIXABAN 5 MG PO TABS
5.0000 mg | ORAL_TABLET | Freq: Two times a day (BID) | ORAL | Status: DC
  Administered 2017-03-17 – 2017-03-20 (×6): 5 mg via ORAL
  Filled 2017-03-17 (×7): qty 1

## 2017-03-17 MED ORDER — FENTANYL CITRATE (PF) 100 MCG/2ML IJ SOLN
INTRAMUSCULAR | Status: DC | PRN
  Administered 2017-03-17 (×3): 50 ug via INTRAVENOUS

## 2017-03-17 MED ORDER — LIDOCAINE HCL (CARDIAC) 20 MG/ML IV SOLN
INTRAVENOUS | Status: DC | PRN
  Administered 2017-03-17: 5 mL via INTRATRACHEAL

## 2017-03-17 MED ORDER — ACETAMINOPHEN 325 MG PO TABS: 650.0000 mg | ORAL_TABLET | ORAL | Status: DC | PRN

## 2017-03-17 MED ORDER — SERTRALINE HCL 50 MG PO TABS
150.0000 mg | ORAL_TABLET | Freq: Every evening | ORAL | Status: DC
  Administered 2017-03-17 – 2017-03-19 (×3): 150 mg via ORAL
  Filled 2017-03-17 (×3): qty 1

## 2017-03-17 MED ORDER — SODIUM CHLORIDE 0.9 % IV SOLN: 250.0000 mL | INTRAVENOUS | Status: DC | PRN

## 2017-03-17 MED ORDER — LIDOCAINE HCL (PF) 1 % IJ SOLN
INTRAMUSCULAR | Status: AC
  Filled 2017-03-17: qty 30

## 2017-03-17 MED ORDER — AMITRIPTYLINE HCL 25 MG PO TABS
25.0000 mg | ORAL_TABLET | Freq: Every day | ORAL | Status: DC
  Administered 2017-03-17 – 2017-03-19 (×3): 25 mg via ORAL
  Filled 2017-03-17 (×3): qty 1

## 2017-03-17 MED ORDER — INSULIN ASPART PROT & ASPART (70-30 MIX) 100 UNIT/ML ~~LOC~~ SUSP
50.0000 [IU] | Freq: Every day | SUBCUTANEOUS | Status: DC
  Administered 2017-03-18 – 2017-03-20 (×3): 50 [IU] via SUBCUTANEOUS
  Filled 2017-03-17: qty 10

## 2017-03-17 MED ORDER — SODIUM CHLORIDE 0.9 % IV SOLN
INTRAVENOUS | Status: DC | PRN
  Administered 2017-03-17 (×2): via INTRAVENOUS

## 2017-03-17 MED ORDER — HEPARIN SODIUM (PORCINE) 1000 UNIT/ML IJ SOLN
INTRAMUSCULAR | Status: DC | PRN
  Administered 2017-03-17: 1000 [IU] via INTRAVENOUS
  Administered 2017-03-17: 12000 [IU] via INTRAVENOUS

## 2017-03-17 MED ORDER — NALOXONE HCL 0.4 MG/ML IJ SOLN
INTRAMUSCULAR | Status: AC
  Filled 2017-03-17: qty 1

## 2017-03-17 MED ORDER — FUROSEMIDE 40 MG PO TABS
40.0000 mg | ORAL_TABLET | Freq: Every day | ORAL | Status: DC
  Administered 2017-03-18: 40 mg via ORAL
  Filled 2017-03-17: qty 1

## 2017-03-17 MED ORDER — PROPOFOL 10 MG/ML IV BOLUS
INTRAVENOUS | Status: DC | PRN
  Administered 2017-03-17: 20 mg via INTRAVENOUS
  Administered 2017-03-17: 100 mg via INTRAVENOUS
  Administered 2017-03-17: 30 mg via INTRAVENOUS

## 2017-03-17 MED ORDER — ONDANSETRON HCL 4 MG/2ML IJ SOLN: 4.0000 mg | Freq: Four times a day (QID) | INTRAMUSCULAR | Status: DC | PRN

## 2017-03-17 MED ORDER — PHENYLEPHRINE HCL 10 MG/ML IJ SOLN
INTRAVENOUS | Status: DC | PRN
  Administered 2017-03-17: 15 ug/min via INTRAVENOUS

## 2017-03-17 SURGICAL SUPPLY — 18 items
BAG SNAP BAND KOVER 36X36 (MISCELLANEOUS) ×3 IMPLANT
BLANKET WARM UNDERBOD FULL ACC (MISCELLANEOUS) ×3 IMPLANT
CATH MAPPNG PENTARAY F 2-6-2MM (CATHETERS) ×1 IMPLANT
CATH SMTCH THERMOCOOL SF DF (CATHETERS) ×3 IMPLANT
CATH SOUNDSTAR ECO REPROCESSED (CATHETERS) ×3 IMPLANT
CATH WEBSTER BI DIR CS D-F CRV (CATHETERS) ×3 IMPLANT
COVER SWIFTLINK CONNECTOR (BAG) ×3 IMPLANT
NEEDLE TRANSEP BRK 71CM 407200 (NEEDLE) ×3 IMPLANT
PACK EP LATEX FREE (CUSTOM PROCEDURE TRAY) ×2
PACK EP LF (CUSTOM PROCEDURE TRAY) ×1 IMPLANT
PAD DEFIB LIFELINK (PAD) ×3 IMPLANT
PATCH CARTO3 (PAD) ×3 IMPLANT
PENTARAY F 2-6-2MM (CATHETERS) ×3
SHEATH AVANTI 11F 11CM (SHEATH) ×3 IMPLANT
SHEATH PINNACLE 7F 10CM (SHEATH) ×6 IMPLANT
SHEATH PINNACLE 9F 10CM (SHEATH) ×3 IMPLANT
SHEATH SWARTZ TS SL2 63CM 8.5F (SHEATH) ×3 IMPLANT
TUBING SMART ABLATE COOLFLOW (TUBING) ×3 IMPLANT

## 2017-03-17 NOTE — Anesthesia Procedure Notes (Signed)
Procedure Name: MAC Date/Time: 03/17/2017 7:45 AM Performed by: Everlean Cherry A Pre-anesthesia Checklist: Patient identified, Emergency Drugs available, Suction available and Patient being monitored Patient Re-evaluated:Patient Re-evaluated prior to induction Oxygen Delivery Method: Simple face mask

## 2017-03-17 NOTE — Progress Notes (Signed)
Family in and out visiting. Eating [place] sandwich. Waiting on tele bed assignment.

## 2017-03-17 NOTE — Anesthesia Procedure Notes (Signed)
Procedure Name: Intubation Date/Time: 03/17/2017 8:38 AM Performed by: Everlean Cherry A Pre-anesthesia Checklist: Patient identified, Emergency Drugs available, Suction available and Patient being monitored Patient Re-evaluated:Patient Re-evaluated prior to induction Oxygen Delivery Method: Circle system utilized Preoxygenation: Pre-oxygenation with 100% oxygen Induction Type: IV induction Ventilation: Mask ventilation without difficulty and Oral airway inserted - appropriate to patient size Laryngoscope Size: Sabra Heck and 2 Grade View: Grade II Tube type: Oral Tube size: 7.5 mm Number of attempts: 1 Airway Equipment and Method: Stylet Placement Confirmation: ETT inserted through vocal cords under direct vision,  positive ETCO2 and breath sounds checked- equal and bilateral Secured at: 23 cm Tube secured with: Tape Dental Injury: Teeth and Oropharynx as per pre-operative assessment

## 2017-03-17 NOTE — Progress Notes (Addendum)
Site area:  Rt groin fv sheaths x3 Site Prior to Removal:  Level 0 Pressure Applied For: 45 minutes Manual:   yes Patient Status During Pull:  stable Post Pull Site:  Level 0 Post Pull Instructions Given:  yes Post Pull Pulses Present: palpable Dressing Applied:  Gauze and tape Bedrest begins @ 1240 Comments: IV saline locked

## 2017-03-17 NOTE — Transfer of Care (Signed)
Immediate Anesthesia Transfer of Care Note  Patient: Alison Bell  Procedure(s) Performed: Procedure(s): Atrial Fibrillation Ablation (N/A)  Patient Location: Cath lab recovery  Anesthesia Type:General  Level of Consciousness: awake, alert , oriented and patient cooperative  Airway & Oxygen Therapy: Patient Spontanous Breathing and Patient connected to face mask oxygen  Post-op Assessment: Report given to RN, Post -op Vital signs reviewed and stable and Patient moving all extremities X 4  Post vital signs: Reviewed and stable  Last Vitals:  Vitals:   03/17/17 0552  BP: (!) 133/96  Pulse: 73  Resp: 18  Temp: 36.7 C  SpO2: 93%    Last Pain:  Vitals:   03/17/17 0552  TempSrc: Oral         Complications: No apparent anesthesia complications

## 2017-03-17 NOTE — Anesthesia Postprocedure Evaluation (Signed)
Anesthesia Post Note  Patient: Alison Bell  Procedure(s) Performed: Procedure(s) (LRB): Atrial Fibrillation Ablation (N/A)     Patient location during evaluation: PACU Anesthesia Type: General Level of consciousness: sedated Pain management: pain level controlled Vital Signs Assessment: post-procedure vital signs reviewed and stable Respiratory status: spontaneous breathing and respiratory function stable Cardiovascular status: stable Anesthetic complications: no    Last Vitals:  Vitals:   03/17/17 1210 03/17/17 1215  BP: (!) 151/57 (!) 147/55  Pulse: 66 64  Resp: 17 19  Temp:    SpO2: 91% 90%    Last Pain:  Vitals:   03/17/17 1132  TempSrc: Temporal                 Nyssa Sayegh DANIEL

## 2017-03-17 NOTE — Anesthesia Postprocedure Evaluation (Signed)
Anesthesia Post Note  Patient: Alison Bell  Procedure(s) Performed: Procedure(s) (LRB): Atrial Fibrillation Ablation (N/A)     Patient location during evaluation: Cath Lab Anesthesia Type: General Level of consciousness: awake and alert Pain management: pain level controlled Vital Signs Assessment: post-procedure vital signs reviewed and stable Respiratory status: spontaneous breathing, nonlabored ventilation, respiratory function stable and patient connected to nasal cannula oxygen Cardiovascular status: blood pressure returned to baseline and stable Postop Assessment: no signs of nausea or vomiting Anesthetic complications: no    Last Vitals:  Vitals:   03/17/17 1220 03/17/17 1235  BP: (!) 150/83 113/87  Pulse: 62 64  Resp: 18 16  Temp:    SpO2: 92% 93%    Last Pain:  Vitals:   03/17/17 1132  TempSrc: Temporal                 Catalina Gravel

## 2017-03-17 NOTE — Anesthesia Preprocedure Evaluation (Addendum)
Anesthesia Evaluation  Patient identified by MRN, date of birth, ID band Patient awake    Reviewed: Allergy & Precautions, NPO status , Patient's Chart, lab work & pertinent test results  Airway Mallampati: II  TM Distance: >3 FB Neck ROM: Full    Dental  (+) Teeth Intact, Dental Advisory Given   Pulmonary former smoker,    Pulmonary exam normal breath sounds clear to auscultation       Cardiovascular hypertension, Pt. on medications (-) angina+CHF  (-) CAD and (-) Past MI + dysrhythmias Atrial Fibrillation  Rhythm:Irregular Rate:Normal     Neuro/Psych  Headaches, PSYCHIATRIC DISORDERS Depression    GI/Hepatic Neg liver ROS, GERD  Medicated and Controlled,  Endo/Other  diabetes, Type 2, Insulin DependentMorbid obesity  Renal/GU negative Renal ROS     Musculoskeletal  (+) Arthritis ,   Abdominal   Peds  Hematology  (+) Blood dyscrasia, anemia ,   Anesthesia Other Findings Day of surgery medications reviewed with the patient.  Reproductive/Obstetrics                            Anesthesia Physical Anesthesia Plan  ASA: III  Anesthesia Plan: MAC   Post-op Pain Management:    Induction: Intravenous  PONV Risk Score and Plan: 2 and Propofol infusion and Treatment may vary due to age or medical condition  Airway Management Planned: Nasal Cannula  Additional Equipment:   Intra-op Plan:   Post-operative Plan:   Informed Consent: I have reviewed the patients History and Physical, chart, labs and discussed the procedure including the risks, benefits and alternatives for the proposed anesthesia with the patient or authorized representative who has indicated his/her understanding and acceptance.   Dental advisory given  Plan Discussed with: CRNA and Anesthesiologist  Anesthesia Plan Comments: (Discussed risks/benefits/alternatives to MAC sedation including need for ventilatory support,  hypotension, need for conversion to general anesthesia.  All patient questions answered.  Patient/guardian wishes to proceed.)      Anesthesia Quick Evaluation

## 2017-03-17 NOTE — H&P (View-Only) (Signed)
PCP: Deland Pretty, MD Primary Cardiologist: Dr [name] Primary EP: Dr Tinnie Gens [name] is a 71 y.o. female who presents today for routine electrophysiology followup.  She has had chronic difficulty with afib.  She has recently failed cardioversion despite amiodarone.  She has SOB and fatigue which she attributes to her afib. Today, she denies symptoms of palpitations, chest pain,  lower extremity edema, dizziness, presyncope, or syncope.  The patient is otherwise without complaint today.   Past Medical History:  Diagnosis Date  . Anemia   . Arthritis    rheumatoid  . Colon adenoma   . Depression   . Diabetes mellitus without complication ([place])   . Difficulty sleeping   . GERD (gastroesophageal reflux disease)   . Headache    migraines  . History of blood transfusion   . HTN (hypertension)   . Hyperlipidemia   . LV dysfunction, EF 45-50% due to a. fib per echo 08/27/12  08/28/2012   a. Echo (10/15):  EF 55-60%, no RWMA, Gr 2 DD, mild MR, mod LAE, normal RVF, mild RAE, mild TR (LA 44 mm)  . Persistent atrial fibrillation ([place])   . [facility])    due to [facility] 04/2014   Past Surgical History:  Procedure Laterality Date  . ATRIAL FIBRILLATION ABLATION N/A 11/02/2012   PVI by Dr [name]  . CARDIOVERSION N/A 09/06/2012   Procedure: CARDIOVERSION;  Surgeon: [name], MD;  Location: [place];  Service: Cardiovascular;  Laterality: N/A;  . CARDIOVERSION N/A 01/07/2013   Procedure: CARDIOVERSION;  Surgeon: [name], MD;  Location: [place] [facility]COPY;  Service: Cardiovascular;  Laterality: N/A;  . CARDIOVERSION N/A 11/14/2013   Procedure: CARDIOVERSION;  Surgeon: [name], MD;  Location: [facility]OSCOPY;  Service: Cardiovascular;  Laterality: N/A;  . CARDIOVERSION N/A 01/19/2017   Procedure: CARDIOVERSION;  Surgeon: [name], MD;  Location: [facility] ENDOSCOPY;  Service: Cardiovascular;  Laterality: N/A;  . COLONOSCOPY N/A 07/28/2014   Procedure: COLONOSCOPY;   Surgeon: Ladene Artist, MD;  Location[facility];  Service: Endoscopy;  Laterality: N/A;  . LAPAROSCOPIC SIGMOID COLECTOMY N/A 08/08/2014   Procedure: LAPAROSCOPIC SIGMOID COLECTOMY [facility] PROCTOSCOPY;  Surgeon: Excell Seltzer, MD;  Location: [facility];  Service: General;  Laterality: N/A;  . TEE WITHOUT CARDIOVERSION N/A 09/06/2012   Procedure: TRANSESOPHAGEAL ECHOCARDIOGRAM (TEE);  Surgeon: [name], MD;  Location[facility];  Service: Cardiovascular;  Laterality: N/A;  . TEE WITHOUT CARDIOVERSION N/A 11/01/2012   Procedure: TRANSESOPHAGEAL ECHOCARDIOGRAM (TEE);  Surgeon: [name], MD;  Location: [facility] ENDOSCOPY;  Service: Cardiovascular;  Laterality: N/A;  . TUBAL LIGATION      ROS- all systems are reviewed and negatives except as per HPI above  Current Outpatient Prescriptions  Medication Sig Dispense Refill  . amiodarone (PACERONE) 200 MG tablet Take 1 tablet (200 mg total) by mouth daily. 90 tablet 2  . amitriptyline (ELAVIL) 25 MG tablet Take 25 mg by mouth at bedtime.   3  . atorvastatin (LIPITOR) 10 MG tablet Take 10 mg by mouth daily.    . cetirizine (ZYRTEC) 10 MG tablet Take 10 mg by mouth daily.    . CHOLECALCIFEROL PO Take 1 tablet by mouth daily.    Marland Kitchen diltiazem (CARTIA XT) 120 MG 24 hr capsule Take 1 capsule (120 mg total) by mouth 2 (two) times daily. 180 capsule 2  . ELIQUIS 5 MG TABS tablet TAKE 1 TABLET BY MOUTH TWICE DAILY. 180 tablet 1  .  famotidine (PEPCID) 20 MG tablet Take 20 mg by mouth 2 (two) times daily.    . furosemide (LASIX) 40 MG tablet Take 1 tablet (40 mg total) by mouth daily. PLEASE CONTACT OFFICE FOR ADDITIONAL REFILLS (Patient taking differently: Take 40 mg by mouth daily. ) 15 tablet 0  . gabapentin (NEURONTIN) 300 MG capsule Take 300 mg by mouth as needed.     Marland Kitchen HUMIRA PEN 40 MG/0.8ML PNKT     . HYDROcodone-acetaminophen (NORCO/VICODIN) 5-325 MG per tablet Take 1 tablet by mouth every 6 (six) hours as needed (pain).     . Insulin  Syringe-Needle U-100 (INSULIN SYRINGE 1CC/31GX5/16") 31G X 5/16" 1 ML MISC   0  . Lancets (ACCU-CHEK MULTICLIX) lancets   11  . Multiple Vitamin (MULTIVITAMIN WITH MINERALS) TABS Take 1 tablet by mouth daily.    Marland Kitchen NOVOLIN 70/30 RELION (70-30) 100 UNIT/ML injection Inject 35-60 Units into the skin 2 (two) times daily with a meal. 60 units in the morning and 35 units in the evening    . oxyCODONE-acetaminophen (ROXICET) 5-325 MG per tablet Take 1 tablet by mouth every 4 (four) hours as needed. 30 tablet 0  . potassium chloride SA (K-DUR,KLOR-CON) 20 MEQ tablet Take 1 tablet (20 mEq total) by mouth daily. 30 tablet 6  . sertraline (ZOLOFT) 100 MG tablet Take one and a half tablets (150 mg) by mouth daily  1  . traZODone (DESYREL) 50 MG tablet Take 50 mg by mouth at bedtime.     No current facility-administered medications for this visit.     Physical Exam: Vitals:   02/23/17 1542  BP: 128/70  Pulse: 60  SpO2: 94%  Weight: 223 lb (101.2 kg)  Height: 5' 2.5" (1.588 m)    GEN- The patient is morbidly obese appearing, alert and oriented x 3 today.   Head- normocephalic, atraumatic Eyes-  Sclera clear, conjunctiva pink Ears- hearing intact Oropharynx- clear Lungs- Clear to ausculation bilaterally, normal work of breathing Heart- irregular rate and rhythm, no murmurs, rubs or gallops, PMI not laterally displaced GI- soft, NT, ND, + BS Extremities- no clubbing, cyanosis, or edema  EKG tracing ordered today is personally reviewed and shows atypical atrial flutter, RBBB  Assessment and Plan:  1. Persistent afib Symptomatic Refractory to amiodarone. Prior ablation 2014 Therapeutic strategies for afib including rate control or ablation were discussed in detail with the patient today. Risk, benefits, and alternatives to EP study and radiofrequency ablation for afib were also discussed in detail today. These risks include but are not limited to stroke, bleeding, vascular damage, tamponade,  perforation, damage to the esophagus, lungs, and other structures, pulmonary vein stenosis, worsening renal function, and death. The patient understands these risk and wishes to proceed.   I have informed her that anticipated success rates would be low (50-60%).  She is clear that she would like to proceed. Cardiac CT is ordered prior to ablation to exclude pulmonary vein stenosis and LAA thrombus.   2. Obesity Body mass index is 40.14 kg/m. I have reviewed the patients BMI and decreased success rates with ablation at length today.  Weight loss is strongly advised.  Per Guijian et al (PACE 2013; 36: 536-144), patients with BMI 25-29.9 (obese) have a 27% increase in AF recurrence post ablation.  Patients with BMI >30 have a 31% increase in AF recurrence post ablation when compared to those with BMI <25.  3. Hypertensive cardiovascular disease Stable No change required today    [name] MD,  [facility] 02/23/2017 3:54 PM

## 2017-03-17 NOTE — Interval H&P Note (Signed)
History and Physical Interval Note:  03/17/2017 7:19 AM  Alison Bell  has presented today for surgery, with the diagnosis of afib  The various methods of treatment have been discussed with the patient and family. After consideration of risks, benefits and other options for treatment, the patient has consented to  Procedure(s): Atrial Fibrillation Ablation (N/A) as a surgical intervention .  The patient's history has been reviewed, patient examined, no change in status, stable for surgery.  I have reviewed the patient's chart and labs.  Questions were answered to the patient's satisfaction.     [name]

## 2017-03-18 ENCOUNTER — Encounter (HOSPITAL_COMMUNITY): Payer: Self-pay | Admitting: Internal Medicine

## 2017-03-18 DIAGNOSIS — I481 Persistent atrial fibrillation: Principal | ICD-10-CM

## 2017-03-18 DIAGNOSIS — I5033 Acute on chronic diastolic (congestive) heart failure: Secondary | ICD-10-CM | POA: Diagnosis not present

## 2017-03-18 LAB — BASIC METABOLIC PANEL
Anion gap: 9 (ref 5–15)
BUN: 14 mg/dL (ref 6–20)
CO2: 27 mmol/L (ref 22–32)
Calcium: 8.5 mg/dL — ABNORMAL LOW (ref 8.9–10.3)
Chloride: 101 mmol/L (ref 101–111)
Creatinine, Ser: 1.11 mg/dL — ABNORMAL HIGH (ref 0.44–1.00)
GFR calc Af Amer: 56 mL/min — ABNORMAL LOW (ref >60)
GFR calc non Af Amer: 48 mL/min — ABNORMAL LOW (ref >60)
Glucose, Bld: 161 mg/dL — ABNORMAL HIGH (ref 65–99)
Potassium: 3.7 mmol/L (ref 3.5–5.1)
Sodium: 137 mmol/L (ref 135–145)

## 2017-03-18 LAB — GLUCOSE, CAPILLARY
Glucose-Capillary: 212 mg/dL — ABNORMAL HIGH (ref 65–99)
Glucose-Capillary: 146 mg/dL — ABNORMAL HIGH (ref 65–99)
Glucose-Capillary: 174 mg/dL — ABNORMAL HIGH (ref 65–99)
Glucose-Capillary: 166 mg/dL — ABNORMAL HIGH (ref 65–99)

## 2017-03-18 MED ORDER — FUROSEMIDE 10 MG/ML IJ SOLN
40.0000 mg | Freq: Two times a day (BID) | INTRAMUSCULAR | Status: DC
  Administered 2017-03-18 – 2017-03-20 (×4): 40 mg via INTRAVENOUS
  Filled 2017-03-18 (×3): qty 4

## 2017-03-18 MED ORDER — FUROSEMIDE 10 MG/ML IJ SOLN
60.0000 mg | Freq: Once | INTRAMUSCULAR | Status: AC
  Administered 2017-03-18: 60 mg via INTRAVENOUS
  Filled 2017-03-18: qty 6

## 2017-03-18 NOTE — Progress Notes (Signed)
Progress Note  Patient Name: Alison Bell Finder Date of Encounter: 03/18/2017  Primary Cardiologist: Dr. [name] Electrophysiologist: Dr. [name]  Subjective   Feels well, a little SOB, no CP or palpitations  Inpatient Medications    Scheduled Meds: . amitriptyline  25 mg Oral QHS  . apixaban  5 mg Oral BID  . insulin aspart  0-9 Units Subcutaneous TID WC  . insulin aspart protamine- aspart  35 Units Subcutaneous Q supper  . insulin aspart protamine- aspart  50 Units Subcutaneous Q breakfast  . potassium chloride SA  20 mEq Oral Daily  . sertraline  150 mg Oral QPM  . sodium chloride flush  3 mL Intravenous Q12H  . traZODone  50 mg Oral QHS   Continuous Infusions: . sodium chloride     PRN Meds: sodium chloride, acetaminophen, HYDROcodone-acetaminophen, ondansetron (ZOFRAN) IV, sodium chloride flush   Vital Signs    Vitals:   03/17/17 1505 03/17/17 1539 03/17/17 2100 03/18/17 0500  BP: (!) 145/46  (!) 111/55 (!) 133/40  Pulse: 63  72   Resp: 16  15 16   Temp:  97.7 F (36.5 C) 99.3 F (37.4 C) 98.3 F (36.8 C)  TempSrc:  Temporal Oral Oral  SpO2: 94%  95% 93%  Weight:    227 lb 9.6 oz (103.2 kg)  Height:        Intake/Output Summary (Last 24 hours) at 03/18/17 1057 Last data filed at 03/18/17 0900  Gross per 24 hour  Intake              680 ml  Output              400 ml  Net              280 ml   Filed Weights   03/17/17 0552 03/18/17 0500  Weight: 220 lb (99.8 kg) 227 lb 9.6 oz (103.2 kg)    Telemetry    SR, 70's - Personally Reviewed  ECG    SR, RBBB (known) - Personally Reviewed  Physical Exam   GEN: No acute distress.   Neck: No JVD Cardiac: RRR, no murmurs, rubs, or gallops.  Respiratory: diminished w/soft crackles b/l bases, no wheezes GI: Soft, nontender, non-distended  MS: No edema; No deformity. Neuro:  Nonfocal  Psych: Normal affect   Labs    No new labs  Radiology    No results found.  Cardiac Studies   No new  studies  Patient Profile     71 y.o. female s/p AFib ablation yesterday  Assessment & Plan    1. Persistent AFib     CHA2DS2Vasc is at least 3, on Eliquis     S/p AF ablation yesterday     Procedure site is stable     SR this AM  2. Fluid OL     She is on O2 this morning, with off desats into 80's,  Exam suggests fluid OL, likely 2/2 intra-procedure fluids       IV lasix today, pending response and if stable off O2 will plan for discharge later today  Signed, [name], PA-C  03/18/2017, 10:57 AM     I have seen, examined the patient, and reviewed the above assessment and plan.  Changes to above are made where necessary.  Doing well s/p ablation.  O2 sats low overnight.  Volume overloaded on exam.  With diurese.  Hopefully home later today  Co Sign: [name], MD 03/18/2017 12:40 PM

## 2017-03-18 NOTE — Plan of Care (Signed)
Problem: Physical Regulation: Goal: Ability to maintain clinical measurements within normal limits will improve Outcome: Progressing Pt denies any pain, Currently NSR on monitor. VSS.  Denies any SOB. Cath site assessed w/o complication. Assisted to [facility] w/ minimal assist.

## 2017-03-19 DIAGNOSIS — I5033 Acute on chronic diastolic (congestive) heart failure: Secondary | ICD-10-CM | POA: Diagnosis not present

## 2017-03-19 DIAGNOSIS — E785 Hyperlipidemia, unspecified: Secondary | ICD-10-CM | POA: Diagnosis present

## 2017-03-19 DIAGNOSIS — E119 Type 2 diabetes mellitus without complications: Secondary | ICD-10-CM | POA: Diagnosis present

## 2017-03-19 DIAGNOSIS — R0602 Shortness of breath: Secondary | ICD-10-CM

## 2017-03-19 DIAGNOSIS — Z7901 Long term (current) use of anticoagulants: Secondary | ICD-10-CM | POA: Diagnosis not present

## 2017-03-19 DIAGNOSIS — Z9049 Acquired absence of other specified parts of digestive tract: Secondary | ICD-10-CM | POA: Diagnosis not present

## 2017-03-19 DIAGNOSIS — Z87891 Personal history of nicotine dependence: Secondary | ICD-10-CM | POA: Diagnosis not present

## 2017-03-19 DIAGNOSIS — F329 Major depressive disorder, single episode, unspecified: Secondary | ICD-10-CM | POA: Diagnosis present

## 2017-03-19 DIAGNOSIS — Z6841 Body Mass Index (BMI) 40.0 and over, adult: Secondary | ICD-10-CM | POA: Diagnosis not present

## 2017-03-19 DIAGNOSIS — Z79899 Other long term (current) drug therapy: Secondary | ICD-10-CM | POA: Diagnosis not present

## 2017-03-19 DIAGNOSIS — Z713 Dietary counseling and surveillance: Secondary | ICD-10-CM | POA: Diagnosis not present

## 2017-03-19 DIAGNOSIS — I481 Persistent atrial fibrillation: Secondary | ICD-10-CM | POA: Diagnosis present

## 2017-03-19 DIAGNOSIS — Z794 Long term (current) use of insulin: Secondary | ICD-10-CM | POA: Diagnosis not present

## 2017-03-19 DIAGNOSIS — K219 Gastro-esophageal reflux disease without esophagitis: Secondary | ICD-10-CM | POA: Diagnosis present

## 2017-03-19 DIAGNOSIS — I119 Hypertensive heart disease without heart failure: Secondary | ICD-10-CM | POA: Diagnosis present

## 2017-03-19 DIAGNOSIS — E8770 Fluid overload, unspecified: Secondary | ICD-10-CM | POA: Diagnosis not present

## 2017-03-19 LAB — GLUCOSE, CAPILLARY
Glucose-Capillary: 158 mg/dL — ABNORMAL HIGH (ref 65–99)
Glucose-Capillary: 124 mg/dL — ABNORMAL HIGH (ref 65–99)
Glucose-Capillary: 136 mg/dL — ABNORMAL HIGH (ref 65–99)
Glucose-Capillary: 184 mg/dL — ABNORMAL HIGH (ref 65–99)

## 2017-03-19 LAB — BASIC METABOLIC PANEL
Anion gap: 9 (ref 5–15)
BUN: 12 mg/dL (ref 6–20)
CO2: 31 mmol/L (ref 22–32)
Calcium: 8.3 mg/dL — ABNORMAL LOW (ref 8.9–10.3)
Chloride: 98 mmol/L — ABNORMAL LOW (ref 101–111)
Creatinine, Ser: 1.15 mg/dL — ABNORMAL HIGH (ref 0.44–1.00)
GFR calc Af Amer: 53 mL/min — ABNORMAL LOW (ref >60)
GFR calc non Af Amer: 46 mL/min — ABNORMAL LOW (ref >60)
Glucose, Bld: 147 mg/dL — ABNORMAL HIGH (ref 65–99)
Potassium: 3.2 mmol/L — ABNORMAL LOW (ref 3.5–5.1)
Sodium: 138 mmol/L (ref 135–145)

## 2017-03-19 MED ORDER — POTASSIUM CHLORIDE CRYS ER 20 MEQ PO TBCR
20.0000 meq | EXTENDED_RELEASE_TABLET | Freq: Two times a day (BID) | ORAL | Status: DC
  Administered 2017-03-19 – 2017-03-20 (×2): 20 meq via ORAL
  Filled 2017-03-19 (×2): qty 1

## 2017-03-19 NOTE — Care Management Note (Addendum)
Case Management Note  Patient Details  Name: Alison Bell MRN: [id number] Date of Birth: 1945/12/01  Subjective/Objective: Pt presented for Persistent Afib. S/p Ablation 03-17-17. Pt with increasing SOB- 02 sat's decreased. Pt is from home with husband. PTA- independent.                     Action/Plan: If pt needs 02 pt wants to use [employer]. Pt wll need to have a qualifying Diagnosis listed in order for insurance to pay for 02. CM will monitor.  Expected Discharge Date:                  Expected Discharge Plan:  Home/Self Care  In-House Referral:  NA  Discharge planning Services  CM Consult  Post Acute Care Choice:  Durable Medical Equipment Choice offered to:  Patient  DME Arranged:  Oxygen DME Agency:   [employer]  HH Arranged:  NA HH Agency:  NA  Status of Service:  Completed, signed off  If discussed at [place] of Stay Meetings, dates discussed:    Additional Comments: 1109 03-20-17 Jacqlyn Krauss, RN,BSN [phone] Pt qualifies for home 02. Pt has dx of CHF. Order placed for 02. Pt wants to use [employer]- Referral called to [place] with Amada Kingfisher will pull the orders from [facility]. 02 tank to be delivered to hospital d/c. [employer] to bring concentrator to home. No further needs from CM at this time.  Bethena Roys, RN 03/19/2017, 4:14 PM

## 2017-03-19 NOTE — Progress Notes (Signed)
SATURATION QUALIFICATIONS: (This note is used to comply with regulatory documentation for home oxygen)  Patient Saturations on Room Air at Rest =86%  Patient Saturations on Room Air while Ambulating = 83%  Patient Saturations on 2 Liters of oxygen while Ambulating = 92%  Please briefly explain why patient needs home oxygen: Pt desats on RA and while ambulating on RA

## 2017-03-19 NOTE — Progress Notes (Addendum)
Progress Note  Patient Name: Alison Bell Date of Encounter: 03/19/2017  Primary Cardiologist: Dr. [name] Electrophysiologist: Dr. [name]  Subjective   Feels better today, less SOB, no CP or palpitations  Inpatient Medications    Scheduled Meds: . amitriptyline  25 mg Oral QHS  . apixaban  5 mg Oral BID  . furosemide  40 mg Intravenous BID  . insulin aspart  0-9 Units Subcutaneous TID WC  . insulin aspart protamine- aspart  35 Units Subcutaneous Q supper  . insulin aspart protamine- aspart  50 Units Subcutaneous Q breakfast  . potassium chloride SA  20 mEq Oral Daily  . sertraline  150 mg Oral QPM  . sodium chloride flush  3 mL Intravenous Q12H  . traZODone  50 mg Oral QHS   Continuous Infusions: . sodium chloride     PRN Meds: sodium chloride, acetaminophen, HYDROcodone-acetaminophen, ondansetron (ZOFRAN) IV, sodium chloride flush   Vital Signs    Vitals:   03/18/17 0500 03/18/17 1612 03/18/17 1958 03/19/17 0423  BP: (!) 133/40 (!) 108/46 (!) 121/46 (!) 126/49  Pulse:   81 75  Resp: 16  (!) 23 18  Temp: 98.3 F (36.8 C) 98.7 F (37.1 C) 98.1 F (36.7 C) 98 F (36.7 C)  TempSrc: Oral Oral Oral Oral  SpO2: 93% 93% 95% 95%  Weight: 227 lb 9.6 oz (103.2 kg)   220 lb 12.8 oz (100.2 kg)  Height:        Intake/Output Summary (Last 24 hours) at 03/19/17 1031 Last data filed at 03/18/17 1532  Gross per 24 hour  Intake                0 ml  Output             1800 ml  Net            -1800 ml   Filed Weights   03/17/17 0552 03/18/17 0500 03/19/17 0423  Weight: 220 lb (99.8 kg) 227 lb 9.6 oz (103.2 kg) 220 lb 12.8 oz (100.2 kg)    Telemetry    SR, 70's - Personally Reviewed  ECG    No new EKGs - Personally Reviewed  Physical Exam   GEN: No acute distress.   Neck: No JVD Cardiac: RRR, no murmurs, rubs, or gallops.  Respiratory: bibasilar rales still prcent GI: Soft, nontender, non-distended  MS: No edema; No deformity. Neuro:  Nonfocal  Psych: Normal  affect   Labs    No new labs  Radiology    No results found.  Cardiac Studies   No new studies  Patient Profile     71 y.o. female s/p AFib ablation 8/28, developed fluid OL likely 2/2 intraprocedure fluids  Assessment & Plan    1. Persistent AFib     CHA2DS2Vasc is at least 3, on Eliquis     S/p AF ablation 8/28     Procedure sites is stable     SR this AM  2. Fluid OL     She c/w exertional SO, though in discussion has some at baseline     Lung exam this AM is a little better     No out put is charted for 2nd shift, patient reported good urine output after 2nd lasix     Will try to wean O2 this AM      90% on 1L while I was at bedside  Discussed with RN to monitor O2 sats and try to wean of oxygen  Signed, [name], PA-C  03/19/2017, 10:31 AM    I have seen, examined the patient, and reviewed the above assessment and plan.  On exam, rales still presents.  Changes to above are made where necessary. She has rest dypsnea and is currently too ill for discharge.  Will continue IV diuresis another day.  Continue to wean O2.  Anticipate discharge to home tomorrow.  May require home O2 if still on O2 tomorrow.  Will consult case management.  Co Sign: [name], MD 03/19/2017 11:01 AM

## 2017-03-20 ENCOUNTER — Telehealth: Payer: Self-pay | Admitting: *Deleted

## 2017-03-20 DIAGNOSIS — I5033 Acute on chronic diastolic (congestive) heart failure: Secondary | ICD-10-CM

## 2017-03-20 LAB — MAGNESIUM: Magnesium: 1.9 mg/dL (ref 1.7–2.4)

## 2017-03-20 LAB — BASIC METABOLIC PANEL
Anion gap: 7 (ref 5–15)
BUN: 20 mg/dL (ref 6–20)
CO2: 33 mmol/L — ABNORMAL HIGH (ref 22–32)
Calcium: 9.5 mg/dL (ref 8.9–10.3)
Chloride: 100 mmol/L — ABNORMAL LOW (ref 101–111)
Creatinine, Ser: 1.21 mg/dL — ABNORMAL HIGH (ref 0.44–1.00)
GFR calc Af Amer: 50 mL/min — ABNORMAL LOW (ref >60)
GFR calc non Af Amer: 43 mL/min — ABNORMAL LOW (ref >60)
Glucose, Bld: 187 mg/dL — ABNORMAL HIGH (ref 65–99)
Potassium: 4 mmol/L (ref 3.5–5.1)
Sodium: 140 mmol/L (ref 135–145)

## 2017-03-20 LAB — GLUCOSE, CAPILLARY
Glucose-Capillary: 180 mg/dL — ABNORMAL HIGH (ref 65–99)
Glucose-Capillary: 191 mg/dL — ABNORMAL HIGH (ref 65–99)

## 2017-03-20 MED ORDER — FUROSEMIDE 40 MG PO TABS: 40.0000 mg | ORAL_TABLET | Freq: Every day | ORAL | 6 refills | Status: AC

## 2017-03-20 MED FILL — Lidocaine HCl Local Preservative Free (PF) Inj 1%: INTRAMUSCULAR | Qty: 30 | Status: AC

## 2017-03-20 NOTE — Telephone Encounter (Signed)
-----   Message from [name], MD sent at 03/10/2017 11:29 PM EDT ----- Results reviewed.  Alma Friendly, please inform pt of result. I will route to primary care also.

## 2017-03-20 NOTE — Discharge Summary (Signed)
ELECTROPHYSIOLOGY PROCEDURE DISCHARGE SUMMARY    [name],  MRN: [id number], DOB/AGE: January 15, 1946 71 y.o.  Admit date: 03/17/2017 Discharge date: 03/20/2017  Primary Care Physician: Deland Pretty, MD  Primary Cardiologist: Dr. [name] Electrophysiologist: [name], MD  Primary Discharge Diagnosis:  1. Persistent AFib     CHA2DS2Vasc is at least 4, on Eliquis 2. Fluid OL  Secondary Discharge Diagnosis:  1. HTN 2. DM 3. Obesity   Procedures This Admission:  1.  Electrophysiology study and radiofrequency catheter ablation on 03/17/17 by Dr [name].   This study demonstrated  CONCLUSIONS: 1. Atrial fibrillation upon presentation.  2. Intracardiac echo reveals a moderate to large sized left atrium. 3. Return of electrical activity along the carina between the right superior and inferior pulmonary veins.  The left pulmonary veins were quiescent from the prior ablation procedure and did not require additional ablation today.  Successful re-isolation of the right superior and inferior pulmonary veins  4. Additional mapping and ablation within the left atrium due to persistence of atrial fibrillation with a posterior wall box demonstrated  5. Atrial fibrillation successfully cardioverted to sinus rhythm. 6. Cavo-tricuspid isthmus ablation performed with complete bidirectional isthmus block achieved 7. No inducible arrhythmias following ablation 8. No early apparent complications.  Brief HPI: Alison Bell is a 71 y.o. female with a history of persistent atrial fibrillation.  They have failed medical therapy with Tikosyn, amiodarone. Risks, benefits, and alternatives to catheter ablation of atrial fibrillation were reviewed with the patient who wished to proceed.  The patient underwent cardiac CT prior to the procedure which demonstrated no LAA thrombus.    Hospital Course:  The patient was admitted and underwent EPS/RFCA of atrial fibrillation with details as  outlined above.  Post-procedure day one she was requiring O2 and found to have fluid OL.  She was kept  2 additional days and diuresed to a cumulatively negative balance of -1162ml with marked improvement of SOB.  She was monitored on telemetry throughout her stay and maintained SR.  Groin sites were without complication on the day of discharge.  The patient off O2 again desaturated to high 70's-low 80's.  In discussion she has baseline DOE that she has blamed on her heart/AF,.  She is a former smoker, and likely is multifactorial.  She did O2 walk yesterday and qualifies for home O2.  She is instructed to see her PMD in the next week to arrange pulmonary w/u and further evaluation. The patient was examined by Dr. [name] and considered to be stable for discharge.  Wound care and restrictions were reviewed with the patient.  The patient will be seen back by Roderic Palau, NP next week for visit and BMET, and in 4 weeks, and Dr [name] in 12 weeks for post ablation follow up.    Physical Exam: Vitals:   03/19/17 1500 03/19/17 1946 03/20/17 0555 03/20/17 0741  BP: (!) 140/55 (!) 118/56 (!) 124/52 (!) 139/50  Pulse:  78 64 72  Resp:  17 16 18   Temp: 97.6 F (36.4 C) 98.4 F (36.9 C) 98 F (36.7 C) 98.1 F (36.7 C)  TempSrc: Oral Oral Oral Oral  SpO2: 92% 92% 99% 99%  Weight:      Height:        GEN- The patient is well appearing, alert and oriented x 3 today.   HEENT: normocephalic, atraumatic; sclera clear, conjunctiva pink; hearing intact; oropharynx clear; neck supple  Lungs- Clear to ausculation bilaterally, normal  work of breathing.  No wheezes, rales, rhonchi Heart- Regular rate and rhythm, no murmurs, rubs or gallops  GI- soft, non-tender, non-distended, bowel sounds present  Extremities- no clubbing, cyanosis, or edema; DP/PT/radial pulses 2+ bilaterally, groin without hematoma/bruit MS- no significant deformity or atrophy Skin- warm and dry, no rash or lesion Psych- euthymic mood,  full affect Neuro- strength and sensation are intact   Labs:   Lab Results  Component Value Date   WBC 9.5 03/05/2017   HGB 13.6 03/05/2017   HCT 40.0 03/05/2017   MCV 92 03/05/2017   PLT 374 03/05/2017     Recent Labs Lab 03/20/17 0257  NA 140  K 4.0  CL 100*  CO2 33*  BUN 20  CREATININE 1.21*  CALCIUM 9.5  GLUCOSE 187*     Discharge Medications:  Allergies as of 03/20/2017      Reactions   Benicar [olmesartan] Anaphylaxis   Latex Hives   Other Itching, Swelling   White meat chicken causes hands and feet to itch and swell.   Tikosyn [dofetilide] Other (See Comments)   Torsades after 1 dose of Tikosyn without significant QT prolongation      Medication List    TAKE these medications   accu-chek multiclix lancets 1 each by Other route as needed.   amiodarone 200 MG tablet Commonly known as:  PACERONE Take 1 tablet (200 mg total) by mouth daily.   amitriptyline 25 MG tablet Commonly known as:  ELAVIL Take 25 mg by mouth at bedtime.   atorvastatin 10 MG tablet Commonly known as:  LIPITOR Take 10 mg by mouth at bedtime.   cetirizine 10 MG tablet Commonly known as:  ZYRTEC Take 10 mg by mouth daily.   [employer] VITAMIN B12 2000 MCG tablet Generic drug:  cyanocobalamin Take 2,000 mcg by mouth daily.   diltiazem 120 MG 24 hr capsule Commonly known as:  CARTIA XT Take 1 capsule (120 mg total) by mouth 2 (two) times daily.   ELIQUIS 5 MG Tabs tablet Generic drug:  apixaban TAKE 1 TABLET BY MOUTH TWICE DAILY.   famotidine 20 MG tablet Commonly known as:  PEPCID Take 20 mg by mouth 2 (two) times daily.   folic acid 1 MG tablet Commonly known as:  FOLVITE Take 1 mg by mouth daily.   furosemide 40 MG tablet Commonly known as:  LASIX Take 1 tablet (40 mg total) by mouth daily.   gabapentin 300 MG capsule Commonly known as:  NEURONTIN Take 300 mg by mouth 3 (three) times daily as needed (for pain.).   HUMIRA PEN 40 MG/0.8ML Pnkt Generic drug:   Adalimumab Inject 40 mg into the skin every 14 (fourteen) days.   INSULIN SYRINGE 1CC/31GX5/16" 31G X 5/16" 1 ML Misc 1 each by Other route 2 (two) times daily.   multivitamin with minerals Tabs tablet Take 1 tablet by mouth daily.   NOVOLIN 70/30 RELION (70-30) 100 UNIT/ML injection Generic drug:  insulin NPH-regular Human Inject 35-50 Units into the skin 2 (two) times daily with a meal. 50 units in the morning and 35 units in the evening   potassium chloride SA 20 MEQ tablet Commonly known as:  K-DUR,KLOR-CON Take 1 tablet (20 mEq total) by mouth daily.   sertraline 100 MG tablet Commonly known as:  ZOLOFT Take 150 mg by mouth every evening. Take one and a half tablets (150 mg) by mouth daily   SYSTANE ULTRA 0.4-0.3 % Soln Generic drug:  Polyethyl Glycol-Propyl Glycol Place 1  drop into both eyes 3 (three) times daily as needed (for dry eyes.).   traZODone 50 MG tablet Commonly known as:  DESYREL Take 50 mg by mouth at bedtime.   Vitamin D3 2000 units Tabs Take 2,000 Units by mouth daily.            Durable Medical Equipment        Start     Ordered   03/20/17 1010  For home use only DME oxygen  Once    Question Answer Comment  Mode or (Route) Nasal cannula   Liters per Minute 2   Oxygen delivery system Gas      03/20/17 1009       Discharge Care Instructions        Start     Ordered   03/20/17 0000  furosemide (LASIX) 40 MG tablet  Daily    Question:  Supervising Provider  Answer:  [name]   03/20/17 1149   03/20/17 0000  Increase activity slowly     03/20/17 1149   03/20/17 0000  Diet - low sodium heart healthy     03/20/17 1149      Disposition:  Discharge Instructions    Diet - low sodium heart healthy    Complete by:  As directed    Increase activity slowly    Complete by:  As directed      Follow-up Information    [facility] Follow up on 03/24/2017.   Specialty:  Cardiology Why:  2:30PM Contact  information: [address] [id number] [place] [phone]       [name], MD Follow up.   Specialty:  Cardiology Contact information: [address] [place] [address] [place]., [place] Follow up.   Why:  Supply oxygen and concentrator.  Contact information: [place] [address] [phone]           Duration of Discharge Encounter: Greater than 30 minutes including physician time.  Signed, Tommye Standard, PA-C 03/20/2017 11:49 AM   I have seen, examined the patient, and reviewed the above assessment and plan.  One exam, RRR.  Few basilar rales  Changes to above are made where necessary.  DC to home today with close follow-up in the AF clinic.  Co Sign: [name], MD 03/20/2017 11:55 AM

## 2017-03-20 NOTE — Telephone Encounter (Signed)
Per husband, patient currently admitted to [facility]. Husband said that he would pull these results up on mychart for patient to review.

## 2017-03-20 NOTE — Progress Notes (Signed)
Patient treated during this stay for fluid over load/CHF will discharge with oxygen  Yesterday walked: SATURATION QUALIFICATIONS: (This note is used to comply with regulatory documentation for home oxygen)  Patient Saturations on Room Air at Rest =86%  Patient Saturations on Room Air while Ambulating = 83%  Patient Saturations on 2 Liters of oxygen while Ambulating = 92%  Please briefly explain why patient needs home oxygen: Pt desats on RA and while ambulating on RA  Tommye Standard, PA-C

## 2017-03-24 ENCOUNTER — Ambulatory Visit (HOSPITAL_COMMUNITY)
Admission: RE | Admit: 2017-03-24 | Discharge: 2017-03-24 | Disposition: A | Discharge status: Home / Self Care | Payer: PPO | Source: Ambulatory Visit | Attending: Nurse Practitioner | Admitting: Nurse Practitioner

## 2017-03-24 ENCOUNTER — Encounter (HOSPITAL_COMMUNITY): Payer: Self-pay | Admitting: Nurse Practitioner

## 2017-03-24 VITALS — BP 128/64 | HR 48 | Ht 62.0 in | Wt 221.0 lb

## 2017-03-24 DIAGNOSIS — I110 Hypertensive heart disease with heart failure: Secondary | ICD-10-CM | POA: Insufficient documentation

## 2017-03-24 DIAGNOSIS — Z9889 Other specified postprocedural states: Secondary | ICD-10-CM | POA: Diagnosis not present

## 2017-03-24 DIAGNOSIS — Z87891 Personal history of nicotine dependence: Secondary | ICD-10-CM | POA: Insufficient documentation

## 2017-03-24 DIAGNOSIS — I4892 Unspecified atrial flutter: Secondary | ICD-10-CM | POA: Diagnosis not present

## 2017-03-24 DIAGNOSIS — I509 Heart failure, unspecified: Secondary | ICD-10-CM | POA: Diagnosis not present

## 2017-03-24 DIAGNOSIS — I4819 Other persistent atrial fibrillation: Secondary | ICD-10-CM

## 2017-03-24 DIAGNOSIS — Z79899 Other long term (current) drug therapy: Secondary | ICD-10-CM | POA: Insufficient documentation

## 2017-03-24 DIAGNOSIS — E785 Hyperlipidemia, unspecified: Secondary | ICD-10-CM | POA: Insufficient documentation

## 2017-03-24 DIAGNOSIS — Z7901 Long term (current) use of anticoagulants: Secondary | ICD-10-CM | POA: Insufficient documentation

## 2017-03-24 DIAGNOSIS — I4891 Unspecified atrial fibrillation: Secondary | ICD-10-CM | POA: Diagnosis present

## 2017-03-24 DIAGNOSIS — I481 Persistent atrial fibrillation: Secondary | ICD-10-CM | POA: Diagnosis not present

## 2017-03-24 DIAGNOSIS — Z794 Long term (current) use of insulin: Secondary | ICD-10-CM | POA: Diagnosis not present

## 2017-03-24 DIAGNOSIS — F329 Major depressive disorder, single episode, unspecified: Secondary | ICD-10-CM | POA: Insufficient documentation

## 2017-03-24 DIAGNOSIS — E119 Type 2 diabetes mellitus without complications: Secondary | ICD-10-CM | POA: Diagnosis not present

## 2017-03-24 DIAGNOSIS — K219 Gastro-esophageal reflux disease without esophagitis: Secondary | ICD-10-CM | POA: Insufficient documentation

## 2017-03-24 NOTE — Progress Notes (Signed)
[name], female   DOB: 14-Jan-1946, 71 y.o.   MRN: [id number]       Date:  03/24/2017   PCP:  Deland Pretty, MD  Cardiologist:  Dr [name] EP: Dr. [name] Primary Electrophysiologist: Roderic Palau, NP    Chief Complaint  Patient presents with  . Atrial Fibrillation     History of Present Illness: Alison Bell is a 71 y.o. female who presents today for f/u in the afib clinic, 12/17/16.  She has not been seen since 08/30/15. She is unsure as to why she has not had f/u, she should have been back in spring of 2017. Amiodarone was not listed on her med sheet. She is in rate control afib today.Pharmacy was called pt has not had amiodarone filled x 18 months. She was unaware she was in afib today but husband and pt report that she is tired. Husband is unsure now if another medical provider may have stopped amiodarone. None of her physicians are in [place] so husband will call and find out. Otherwise, they are interested in her reloading on amiodarone to see if she can get back in SR to give her more energy. She had torsades years ago on Tikosyn while loading and it was d/c.  Pt's husband did call and could not find a reason with other providers why amiodarone was stopped so it must have fallen of her drug list by accident. She wanted to get back in rhythm so amio 200 mg bid was started 5/30,. She is back in the office  and continues in aflutter with variable AV block. She will be set up for cardioversion July 2nd.  F/u in afib clinic 7/10. She did have successful cardioversion, 7/2, but unfortunately today, EKG shows a flutter at 116 bpm. She does not feel as well with some dizziness, H/A, fatigue. Continues on amiodarone at 200 mg daily.  F/u in afib clinic from afib ablation. Pt is in SR although slow at 48 bpm. Pt states that at home HR's have been in the low 50's. Has not noted any lightheadedness with this. She had some heart failure around the time of ablation and was diuresed in the  hospital. Her pulse ox also was running lower and she went home on O2. She has not noted any afib or swallowing issues or rt groin issues. She and her husband are planning to go to the beach tomorrow for a few days.  Today, she denies symptoms of palpitations, chest pain,orthopnea, PND, lower extremity edema, claudication, dizziness, presyncope, syncope, bleeding, or neurologic sequela. Positive for fatigue, H/A,lightheadeness. The patient is tolerating medications without difficulties and is otherwise without complaint today.    Past Medical History:  Diagnosis Date  . Anemia   . Arthritis    rheumatoid  . Colon adenoma   . Depression   . Diabetes mellitus without complication ([place])   . Difficulty sleeping   . GERD (gastroesophageal reflux disease)   . Headache    migraines  . History of blood transfusion   . HTN (hypertension)   . Hyperlipidemia   . LV dysfunction, EF 45-50% due to a. fib per echo 08/27/12  08/28/2012   a. Echo (10/15):  EF 55-60%, no RWMA, Gr 2 DD, mild MR, mod LAE, normal RVF, mild RAE, mild TR (LA 44 mm)  . Persistent atrial fibrillation ([place])   . Torsades de pointes [facility])    due to [facility] 04/2014   Past Surgical History:  Procedure Laterality Date  .  ATRIAL FIBRILLATION ABLATION N/A 11/02/2012   PVI by Dr [name]  . ATRIAL FIBRILLATION ABLATION N/A 03/17/2017   Procedure: Atrial Fibrillation Ablation;  Surgeon: [name], MD;  Location: [place] CV LAB;  Service: Cardiovascular;  Laterality: N/A;  . CARDIOVERSION N/A 09/06/2012   Procedure: CARDIOVERSION;  Surgeon: [name], MD;  Location: [place];  Service: Cardiovascular;  Laterality: N/A;  . CARDIOVERSION N/A 01/07/2013   Procedure: CARDIOVERSION;  Surgeon: [name], MD;  Location: [place] ENDOSCOPY;  Service: Cardiovascular;  Laterality: N/A;  . CARDIOVERSION N/A 11/14/2013   Procedure: CARDIOVERSION;  Surgeon: [name], MD;  Location: [facility] ENDOSCOPY;  Service: Cardiovascular;   Laterality: N/A;  . CARDIOVERSION N/A 01/19/2017   Procedure: CARDIOVERSION;  Surgeon: [name], MD;  Location: [facility] ENDOSCOPY;  Service: Cardiovascular;  Laterality: N/A;  . COLONOSCOPY N/A 07/28/2014   Procedure: COLONOSCOPY;  Surgeon: Ladene Artist, MD;  Location: [facility] ENDOSCOPY;  Service: Endoscopy;  Laterality: N/A;  . LAPAROSCOPIC SIGMOID COLECTOMY N/A 08/08/2014   Procedure: LAPAROSCOPIC SIGMOID COLECTOMY [facility] PROCTOSCOPY;  Surgeon: Excell Seltzer, MD;  Location: [facility] ORS;  Service: General;  Laterality: N/A;  . TEE WITHOUT CARDIOVERSION N/A 09/06/2012   Procedure: TRANSESOPHAGEAL ECHOCARDIOGRAM (TEE);  Surgeon: [name], MD;  Location: [facility] ENDOSCOPY;  Service: Cardiovascular;  Laterality: N/A;  . TEE WITHOUT CARDIOVERSION N/A 11/01/2012   Procedure: TRANSESOPHAGEAL ECHOCARDIOGRAM (TEE);  Surgeon: [name], MD;  Location: [facility] ENDOSCOPY;  Service: Cardiovascular;  Laterality: N/A;  . TUBAL LIGATION       Current Outpatient Prescriptions  Medication Sig Dispense Refill  . amiodarone (PACERONE) 200 MG tablet Take 1 tablet (200 mg total) by mouth daily. 90 tablet 2  . amitriptyline (ELAVIL) 25 MG tablet Take 25 mg by mouth at bedtime.   3  . atorvastatin (LIPITOR) 10 MG tablet Take 10 mg by mouth at bedtime.     . cetirizine (ZYRTEC) 10 MG tablet Take 10 mg by mouth daily.    . Cholecalciferol (VITAMIN D3) 2000 units TABS Take 2,000 Units by mouth daily.    . cyanocobalamin (CVS VITAMIN B12) 2000 MCG tablet Take 2,000 mcg by mouth daily.    Marland Kitchen diltiazem (CARTIA XT) 120 MG 24 hr capsule Take 1 capsule (120 mg total) by mouth 2 (two) times daily. 180 capsule 2  . ELIQUIS 5 MG TABS tablet TAKE 1 TABLET BY MOUTH TWICE DAILY. 180 tablet 1  . famotidine (PEPCID) 20 MG tablet Take 20 mg by mouth 2 (two) times daily.    . folic acid (FOLVITE) 1 MG tablet Take 1 mg by mouth daily.    . furosemide (LASIX) 40 MG tablet Take 1 tablet (40 mg total) by mouth daily. 30 tablet 6  .  gabapentin (NEURONTIN) 300 MG capsule Take 300 mg by mouth 3 (three) times daily as needed (for pain.).     Marland Kitchen HUMIRA PEN 40 MG/0.8ML PNKT Inject 40 mg into the skin every 14 (fourteen) days.     . Insulin Syringe-Needle U-100 (INSULIN SYRINGE 1CC/31GX5/16") 31G X 5/16" 1 ML MISC 1 each by Other route 2 (two) times daily.   0  . Lancets (ACCU-CHEK MULTICLIX) lancets 1 each by Other route as needed.   11  . Multiple Vitamin (MULTIVITAMIN WITH MINERALS) TABS Take 1 tablet by mouth daily.    Marland Kitchen NOVOLIN 70/30 RELION (70-30) 100 UNIT/ML injection Inject 35-50 Units into the skin 2 (two) times daily with a meal. 60 units in the morning and 35 units  in the evening    . Polyethyl Glycol-Propyl Glycol (SYSTANE ULTRA) 0.4-0.3 % SOLN Place 1 drop into both eyes 3 (three) times daily as needed (for dry eyes.).    Marland Kitchen potassium chloride SA (K-DUR,KLOR-CON) 20 MEQ tablet Take 1 tablet (20 mEq total) by mouth daily. 30 tablet 6  . sertraline (ZOLOFT) 100 MG tablet Take 150 mg by mouth every evening. Take one and a half tablets (150 mg) by mouth daily  1  . traZODone (DESYREL) 50 MG tablet Take 50 mg by mouth at bedtime.     No current facility-administered medications for this encounter.     Allergies:   Benicar [olmesartan]; Latex; Other; and Tikosyn [dofetilide]   Social History:  The patient  reports that she quit smoking about 22 years ago. She has never used smokeless tobacco. She reports that she does not drink alcohol or use drugs.   Family History:  The patient's  family history includes Diabetes type II in her father; Heart attack in her maternal grandfather, maternal grandmother, mother, and paternal grandmother.    ROS:  Please see the history of present illness.   All other systems are reviewed and negative.    PHYSICAL EXAM: VS:  BP 128/64 (BP Location: Left Arm, Patient Position: Sitting, Cuff Size: Large)   Pulse (!) 48   Ht 5\' 2"  (1.575 m)   Wt 221 lb (100.2 kg)   BMI 40.42 kg/m  , BMI  Body mass index is 40.42 kg/m. GEN: Well nourished, well developed, in no acute distress  HEENT: normal  Neck: no JVD, carotid bruits, or masses Cardiac: IRRR; no murmurs, rubs, or gallops,no edema  Respiratory:  clear to auscultation bilaterally,few crackles rt lung base GI: soft, nontender, nondistended, + BS MS: no deformity or atrophy  Skin: warm and dry  Neuro:  Strength and sensation are intact Psych: euthymic mood, full affect  EKG: atrial flutter at 116 bpm, pr int 202 ms, qrs int 132 ms, qtc 586 ms    Lipid Panel     Wt Readings from Last 3 Encounters:  03/24/17 221 lb (100.2 kg)  03/19/17 220 lb 12.8 oz (100.2 kg)  02/23/17 223 lb (101.2 kg)      ASSESSMENT AND PLAN:  1.  Atrial fibrillation/flutter Successful cardioversion but with ERAF Symptomatic with fatigue, H/A, lightheadedness  Successful ablation one week ago Continue amiodarone 200 mg one a day Continue  cartia 120 mg   bid for now, but if continued HR in the 40's, will decrease cartia to 120 mg qd Continue apixaban 5 mg bid for chadsvasc score of at least 6  2. HTN Stable  Avoid salt  3. HF Weight is stable from hospitalization when she was diuresed   F/u with Dr. [name] 9/27  Geroge Baseman. Jameelah Watts, [facility] [address] [place], [place] [address] [phone]

## 2017-04-07 ENCOUNTER — Telehealth (HOSPITAL_COMMUNITY): Payer: Self-pay | Admitting: *Deleted

## 2017-04-07 MED ORDER — DILTIAZEM HCL ER COATED BEADS 120 MG PO CP24: 120.0000 mg | ORAL_CAPSULE | Freq: Every day | ORAL | 2 refills | Status: DC

## 2017-04-07 NOTE — Telephone Encounter (Signed)
Patient's husband called in stating patients heart rate continues to run in the low to mid 40s with energy. Discussed with Roderic Palau NP -- will decrease cardizem to 120mg  once a day. Follow up is scheduled with [facility]red for 9/27. Husband verbalized understanding.

## 2017-04-16 ENCOUNTER — Ambulatory Visit (HOSPITAL_COMMUNITY)
Admission: RE | Admit: 2017-04-16 | Discharge: 2017-04-16 | Disposition: A | Discharge status: Home / Self Care | Payer: PPO | Source: Ambulatory Visit | Attending: Nurse Practitioner | Admitting: Nurse Practitioner

## 2017-04-16 ENCOUNTER — Encounter (HOSPITAL_COMMUNITY): Payer: Self-pay | Admitting: Nurse Practitioner

## 2017-04-16 VITALS — BP 142/76 | HR 46 | Ht 62.0 in | Wt 220.0 lb

## 2017-04-16 DIAGNOSIS — R0600 Dyspnea, unspecified: Secondary | ICD-10-CM | POA: Insufficient documentation

## 2017-04-16 DIAGNOSIS — Z87891 Personal history of nicotine dependence: Secondary | ICD-10-CM | POA: Insufficient documentation

## 2017-04-16 DIAGNOSIS — Z794 Long term (current) use of insulin: Secondary | ICD-10-CM | POA: Diagnosis not present

## 2017-04-16 DIAGNOSIS — I481 Persistent atrial fibrillation: Secondary | ICD-10-CM

## 2017-04-16 DIAGNOSIS — I10 Essential (primary) hypertension: Secondary | ICD-10-CM | POA: Diagnosis not present

## 2017-04-16 DIAGNOSIS — R0602 Shortness of breath: Secondary | ICD-10-CM | POA: Diagnosis not present

## 2017-04-16 DIAGNOSIS — Z79899 Other long term (current) drug therapy: Secondary | ICD-10-CM | POA: Diagnosis not present

## 2017-04-16 DIAGNOSIS — E119 Type 2 diabetes mellitus without complications: Secondary | ICD-10-CM | POA: Diagnosis not present

## 2017-04-16 DIAGNOSIS — I4892 Unspecified atrial flutter: Secondary | ICD-10-CM | POA: Diagnosis present

## 2017-04-16 DIAGNOSIS — I4891 Unspecified atrial fibrillation: Secondary | ICD-10-CM | POA: Diagnosis present

## 2017-04-16 DIAGNOSIS — I4819 Other persistent atrial fibrillation: Secondary | ICD-10-CM

## 2017-04-16 DIAGNOSIS — Z9889 Other specified postprocedural states: Secondary | ICD-10-CM | POA: Diagnosis not present

## 2017-04-16 DIAGNOSIS — I517 Cardiomegaly: Secondary | ICD-10-CM | POA: Insufficient documentation

## 2017-04-16 DIAGNOSIS — J811 Chronic pulmonary edema: Secondary | ICD-10-CM | POA: Diagnosis not present

## 2017-04-16 NOTE — Patient Instructions (Addendum)
Your physician has recommended you make the following change in your medication:  1)Stop Cardizem    pulmonary function tests --- arrive 15 mins prior to appointment in the main entrance - ask to be taken to respiratory.  No smoking, inhalers or caffeine 4 hours prior to test -- take all regular medications and food/drink as normal  Scheduler will be in touch with you

## 2017-04-17 NOTE — Progress Notes (Signed)
[name], female   DOB: 04-08-1946, 71 y.o.   MRN: [id number]       Date:  04/17/2017   PCP:  Deland Pretty, MD  Cardiologist:  Dr [name] EP: Dr. [name] Primary Electrophysiologist: Roderic Palau, NP    Chief Complaint  Patient presents with  . Atrial Fibrillation     History of Present Illness: Alison Bell is a 71 y.o. female who presented for f/u in the afib clinic, 12/17/16.  She had not been seen since 08/30/15. She is unsure as to why she has not had f/u, she should have been back in spring of 2017. Amiodarone was not listed on her med sheet. She is in rate control afib today.Pharmacy was called pt has not had amiodarone filled x 18 months. She was unaware she was in afib today but husband and pt report that she is tired. Husband is unsure now if another medical provider may have stopped amiodarone. None of her physicians are in [place] so husband will call and find out. Otherwise, they are interested in her reloading on amiodarone to see if she can get back in SR to give her more energy. She had torsades years ago on Tikosyn while loading and it was d/c.  Pt's husband did call and could not find a reason with other providers why amiodarone was stopped so it must have fallen of her drug list by accident. She wanted to get back in rhythm so amio 200 mg bid was started 5/30,. She returned to the office  and continued in aflutter with variable AV block. She was set up for cardioversion July 2nd.  F/u in afib clinic 7/10. She did have successful cardioversion, 7/2, but unfortunately today, EKG shows a flutter at 116 bpm. She does not feel as well with some dizziness, H/A, fatigue. Continues on amiodarone at 200 mg daily. Referred to Dr. [name] for options to restore SR.  F/u in afib clinic from afib ablation. Pt is in SR although slow at 48 bpm. Pt states that at home HR's have been in the low 50's. Has not noted any lightheadedness with this. She had some heart failure around the time  of ablation and was diuresed in the hospital. Her pulse ox also was running lower and she went home on O2. She has not noted any afib or swallowing issues or rt groin issues. She and her husband are planning to go to the beach tomorrow for a few days.  Return  to afib clinic 9/27. She is still wearing O2 by [place] that Dr. [name] d/c pt on. She states that she still has shortness of breath. She has been staying in sinus rhythm at  48 bpm. Weight is stable.  Today, she denies symptoms of palpitations, chest pain,orthopnea, PND, lower extremity edema, claudication, dizziness, presyncope, syncope, bleeding, or neurologic sequela. Positive for fatigue, H/A,lightheadeness. The patient is tolerating medications without difficulties and is otherwise without complaint today.    Past Medical History:  Diagnosis Date  . Anemia   . Arthritis    rheumatoid  . Colon adenoma   . Depression   . Diabetes mellitus without complication ([place])   . Difficulty sleeping   . GERD (gastroesophageal reflux disease)   . Headache    migraines  . History of blood transfusion   . HTN (hypertension)   . Hyperlipidemia   . LV dysfunction, EF 45-50% due to a. fib per echo 08/27/12  08/28/2012   a. Echo (10/15):  EF  55-60%, no RWMA, Gr 2 DD, mild MR, mod LAE, normal RVF, mild RAE, mild TR (LA 44 mm)  . Persistent atrial fibrillation ([place])   . Torsades de pointes [facility])    due to [facility] 04/2014   Past Surgical History:  Procedure Laterality Date  . ATRIAL FIBRILLATION ABLATION N/A 11/02/2012   PVI by Dr [name]  . ATRIAL FIBRILLATION ABLATION N/A 03/17/2017   Procedure: Atrial Fibrillation Ablation;  Surgeon: [name], MD;  Location: [place] CV LAB;  Service: Cardiovascular;  Laterality: N/A;  . CARDIOVERSION N/A 09/06/2012   Procedure: CARDIOVERSION;  Surgeon: [name], MD;  Location: [place];  Service: Cardiovascular;  Laterality: N/A;  . CARDIOVERSION N/A 01/07/2013   Procedure: CARDIOVERSION;   Surgeon: [name], MD;  Location: [place] ENDOSCOPY;  Service: Cardiovascular;  Laterality: N/A;  . CARDIOVERSION N/A 11/14/2013   Procedure: CARDIOVERSION;  Surgeon: [name], MD;  Location: [facility] ENDOSCOPY;  Service: Cardiovascular;  Laterality: N/A;  . CARDIOVERSION N/A 01/19/2017   Procedure: CARDIOVERSION;  Surgeon: [name], MD;  Location: [facility] ENDOSCOPY;  Service: Cardiovascular;  Laterality: N/A;  . COLONOSCOPY N/A 07/28/2014   Procedure: COLONOSCOPY;  Surgeon: Ladene Artist, MD;  Location: [facility] ENDOSCOPY;  Service: Endoscopy;  Laterality: N/A;  . LAPAROSCOPIC SIGMOID COLECTOMY N/A 08/08/2014   Procedure: LAPAROSCOPIC SIGMOID COLECTOMY [facility] PROCTOSCOPY;  Surgeon: Excell Seltzer, MD;  Location: [facility];  Service: General;  Laterality: N/A;  . TEE WITHOUT CARDIOVERSION N/A 09/06/2012   Procedure: TRANSESOPHAGEAL ECHOCARDIOGRAM (TEE);  Surgeon: [name], MD;  Location: [facility] ENDOSCOPY;  Service: Cardiovascular;  Laterality: N/A;  . TEE WITHOUT CARDIOVERSION N/A 11/01/2012   Procedure: TRANSESOPHAGEAL ECHOCARDIOGRAM (TEE);  Surgeon: [name], MD;  Location: [facility] ENDOSCOPY;  Service: Cardiovascular;  Laterality: N/A;  . TUBAL LIGATION       Current Outpatient Prescriptions  Medication Sig Dispense Refill  . amiodarone (PACERONE) 200 MG tablet Take 1 tablet (200 mg total) by mouth daily. 90 tablet 2  . amitriptyline (ELAVIL) 25 MG tablet Take 25 mg by mouth at bedtime.   3  . atorvastatin (LIPITOR) 10 MG tablet Take 10 mg by mouth at bedtime.     . cetirizine (ZYRTEC) 10 MG tablet Take 10 mg by mouth daily.    . Cholecalciferol (VITAMIN D3) 2000 units TABS Take 2,000 Units by mouth daily.    . cyanocobalamin (CVS VITAMIN B12) 2000 MCG tablet Take 2,000 mcg by mouth daily.    Marland Kitchen ELIQUIS 5 MG TABS tablet TAKE 1 TABLET BY MOUTH TWICE DAILY. 180 tablet 1  . famotidine (PEPCID) 20 MG tablet Take 20 mg by mouth 2 (two) times daily.    . folic acid (FOLVITE) 1 MG tablet Take 1  mg by mouth daily.    . furosemide (LASIX) 40 MG tablet Take 1 tablet (40 mg total) by mouth daily. 30 tablet 6  . gabapentin (NEURONTIN) 300 MG capsule Take 300 mg by mouth 3 (three) times daily as needed (for pain.).     Marland Kitchen HUMIRA PEN 40 MG/0.8ML PNKT Inject 40 mg into the skin every 14 (fourteen) days.     . Insulin Syringe-Needle U-100 (INSULIN SYRINGE 1CC/31GX5/16") 31G X 5/16" 1 ML MISC 1 each by Other route 2 (two) times daily.   0  . Lancets (ACCU-CHEK MULTICLIX) lancets 1 each by Other route as needed.   11  . Multiple Vitamin (MULTIVITAMIN WITH MINERALS) TABS Take 1 tablet by mouth daily.    Marland Kitchen NOVOLIN 70/30 RELION (  70-30) 100 UNIT/ML injection Inject 35-50 Units into the skin 2 (two) times daily with a meal. 60 units in the morning and 35 units in the evening    . Polyethyl Glycol-Propyl Glycol (SYSTANE ULTRA) 0.4-0.3 % SOLN Place 1 drop into both eyes 3 (three) times daily as needed (for dry eyes.).    Marland Kitchen potassium chloride SA (K-DUR,KLOR-CON) 20 MEQ tablet Take 1 tablet (20 mEq total) by mouth daily. 30 tablet 6  . sertraline (ZOLOFT) 100 MG tablet Take 150 mg by mouth every evening. Take one and a half tablets (150 mg) by mouth daily  1  . traZODone (DESYREL) 50 MG tablet Take 50 mg by mouth at bedtime.     No current facility-administered medications for this encounter.     Allergies:   Benicar [olmesartan]; Latex; Other; and Tikosyn [dofetilide]   Social History:  The patient  reports that she quit smoking about 22 years ago. She has never used smokeless tobacco. She reports that she does not drink alcohol or use drugs.   Family History:  The patient's  family history includes Diabetes type II in her father; Heart attack in her maternal grandfather, maternal grandmother, mother, and paternal grandmother.    ROS:  Please see the history of present illness.   All other systems are reviewed and negative.    PHYSICAL EXAM: VS:  BP (!) 142/76 (BP Location: Left Arm, Patient  Position: Sitting, Cuff Size: Large)   Pulse (!) 46   Ht 5\' 2"  (1.575 m)   Wt 220 lb (99.8 kg)   SpO2 90% Comment: 2 liters oxygen  BMI 40.24 kg/m  , BMI Body mass index is 40.24 kg/m. GEN: Well nourished, well developed, in no acute distress  HEENT: normal  Neck: no JVD, carotid bruits, or masses Cardiac: RRR; no murmurs, rubs, or gallops,no edema  Respiratory:  clear to auscultation bilaterally,few crackles rt lung base GI: soft, nontender, nondistended, + BS MS: no deformity or atrophy  Skin: warm and dry  Neuro:  Strength and sensation are intact Psych: euthymic mood, full affect  EKG: sinus brady at 46 bpm    Lipid Panel     Wt Readings from Last 3 Encounters:  04/16/17 220 lb (99.8 kg)  03/24/17 221 lb (100.2 kg)  03/19/17 220 lb 12.8 oz (100.2 kg)      ASSESSMENT AND PLAN:  1.  Atrial fibrillation/flutter Successful cardioversion but with ERAF Symptomatic with fatigue, H/A, lightheadedness  Successful ablation Continues with shortness of breath Continue amiodarone 200 mg one a day Stop cardizem 2/2 brady Continue apixaban 5 mg bid for chadsvasc score of at least 6  2. HTN Stable  Avoid salt  3. HF Weight is stable  4. Shortness of breath with exertion  CXR PFT's  F/u with Dr. [name]  3 months post ablation  Butch Penny C. Hermena Swint, [facility] [address] [place], [place] [address] [phone]

## 2017-04-19 DIAGNOSIS — I4891 Unspecified atrial fibrillation: Secondary | ICD-10-CM | POA: Diagnosis not present

## 2017-04-19 DIAGNOSIS — I5033 Acute on chronic diastolic (congestive) heart failure: Secondary | ICD-10-CM | POA: Diagnosis not present

## 2017-04-19 DIAGNOSIS — I10 Essential (primary) hypertension: Secondary | ICD-10-CM | POA: Diagnosis not present

## 2017-04-23 ENCOUNTER — Ambulatory Visit (HOSPITAL_COMMUNITY)
Admission: RE | Admit: 2017-04-23 | Discharge: 2017-04-23 | Disposition: A | Discharge status: Home / Self Care | Payer: PPO | Source: Ambulatory Visit | Attending: Nurse Practitioner | Admitting: Nurse Practitioner

## 2017-04-23 DIAGNOSIS — R942 Abnormal results of pulmonary function studies: Secondary | ICD-10-CM | POA: Diagnosis not present

## 2017-04-23 DIAGNOSIS — I4819 Other persistent atrial fibrillation: Secondary | ICD-10-CM

## 2017-04-23 DIAGNOSIS — I481 Persistent atrial fibrillation: Secondary | ICD-10-CM | POA: Insufficient documentation

## 2017-04-23 DIAGNOSIS — Z79899 Other long term (current) drug therapy: Secondary | ICD-10-CM | POA: Diagnosis not present

## 2017-04-23 DIAGNOSIS — Z87891 Personal history of nicotine dependence: Secondary | ICD-10-CM | POA: Diagnosis not present

## 2017-04-23 LAB — PULMONARY FUNCTION TEST
DL/VA % pred: 67 %
DL/VA: 3.04 ml/min/mmHg/L
DLCO unc % pred: 39 %
DLCO unc: 8.59 ml/min/mmHg
FEF 25-75 Post: 2.45 L/sec
FEF 25-75 Pre: 1.86 L/sec
FEF2575-%Change-Post: 31 %
FEF2575-%Pred-Post: 151 %
FEF2575-%Pred-Pre: 114 %
FEV1-%Change-Post: 4 %
FEV1-%Pred-Post: 76 %
FEV1-%Pred-Pre: 73 %
FEV1-Post: 1.51 L
FEV1-Pre: 1.44 L
FEV1FVC-%Change-Post: 0 %
FEV1FVC-%Pred-Pre: 114 %
FEV6-%Change-Post: 3 %
FEV6-%Pred-Post: 69 %
FEV6-%Pred-Pre: 66 %
FEV6-Post: 1.73 L
FEV6-Pre: 1.67 L
FEV6FVC-%Change-Post: 0 %
FEV6FVC-%Pred-Post: 105 %
FEV6FVC-%Pred-Pre: 105 %
FVC-%Change-Post: 3 %
FVC-%Pred-Post: 65 %
FVC-%Pred-Pre: 63 %
FVC-Post: 1.74 L
FVC-Pre: 1.67 L
Post FEV1/FVC ratio: 87 %
Post FEV6/FVC ratio: 100 %
Pre FEV1/FVC ratio: 86 %
Pre FEV6/FVC Ratio: 100 %
RV % pred: 93 %
RV: 2.03 L
TLC % pred: 80 %
TLC: 3.83 L

## 2017-04-23 MED ORDER — ALBUTEROL SULFATE (2.5 MG/3ML) 0.083% IN NEBU
2.5000 mg | INHALATION_SOLUTION | Freq: Once | RESPIRATORY_TRACT | Status: AC
  Administered 2017-04-23: 2.5 mg via RESPIRATORY_TRACT

## 2017-04-27 ENCOUNTER — Other Ambulatory Visit (HOSPITAL_COMMUNITY): Payer: Self-pay | Admitting: *Deleted

## 2017-04-28 DIAGNOSIS — Z9889 Other specified postprocedural states: Secondary | ICD-10-CM | POA: Diagnosis not present

## 2017-04-28 DIAGNOSIS — Z23 Encounter for immunization: Secondary | ICD-10-CM | POA: Diagnosis not present

## 2017-04-28 DIAGNOSIS — Z6841 Body Mass Index (BMI) 40.0 and over, adult: Secondary | ICD-10-CM | POA: Diagnosis not present

## 2017-04-28 DIAGNOSIS — Z8659 Personal history of other mental and behavioral disorders: Secondary | ICD-10-CM | POA: Diagnosis not present

## 2017-04-28 DIAGNOSIS — G252 Other specified forms of tremor: Secondary | ICD-10-CM | POA: Diagnosis not present

## 2017-05-20 DIAGNOSIS — I5033 Acute on chronic diastolic (congestive) heart failure: Secondary | ICD-10-CM | POA: Diagnosis not present

## 2017-05-20 DIAGNOSIS — I10 Essential (primary) hypertension: Secondary | ICD-10-CM | POA: Diagnosis not present

## 2017-05-20 DIAGNOSIS — I4891 Unspecified atrial fibrillation: Secondary | ICD-10-CM | POA: Diagnosis not present

## 2017-05-25 ENCOUNTER — Other Ambulatory Visit: Payer: Self-pay | Admitting: Internal Medicine

## 2017-05-25 NOTE — Telephone Encounter (Signed)
Pt last saw Roderic Palau, NP on 04/16/17, last labs 03/20/17 Creat 1.21, weight 99.8kg, age 71, based on specified criteria pt is on appropriate dosage of Eliquis 5mg  BID.  Will refill rx.

## 2017-06-17 DIAGNOSIS — E1122 Type 2 diabetes mellitus with diabetic chronic kidney disease: Secondary | ICD-10-CM | POA: Diagnosis not present

## 2017-06-17 DIAGNOSIS — I10 Essential (primary) hypertension: Secondary | ICD-10-CM | POA: Diagnosis not present

## 2017-06-17 DIAGNOSIS — G629 Polyneuropathy, unspecified: Secondary | ICD-10-CM | POA: Diagnosis not present

## 2017-06-17 DIAGNOSIS — E0590 Thyrotoxicosis, unspecified without thyrotoxic crisis or storm: Secondary | ICD-10-CM | POA: Diagnosis not present

## 2017-06-19 DIAGNOSIS — I4891 Unspecified atrial fibrillation: Secondary | ICD-10-CM | POA: Diagnosis not present

## 2017-06-19 DIAGNOSIS — I10 Essential (primary) hypertension: Secondary | ICD-10-CM | POA: Diagnosis not present

## 2017-06-19 DIAGNOSIS — I5033 Acute on chronic diastolic (congestive) heart failure: Secondary | ICD-10-CM | POA: Diagnosis not present

## 2017-06-22 ENCOUNTER — Encounter: Payer: Self-pay | Admitting: Diagnostic Neuroimaging

## 2017-06-22 ENCOUNTER — Encounter: Payer: Self-pay | Admitting: Internal Medicine

## 2017-06-22 ENCOUNTER — Ambulatory Visit (INDEPENDENT_AMBULATORY_CARE_PROVIDER_SITE_OTHER): Payer: PPO | Admitting: Internal Medicine

## 2017-06-22 VITALS — BP 144/52 | HR 64 | Ht 63.0 in | Wt 218.0 lb

## 2017-06-22 DIAGNOSIS — I4819 Other persistent atrial fibrillation: Secondary | ICD-10-CM

## 2017-06-22 DIAGNOSIS — I481 Persistent atrial fibrillation: Secondary | ICD-10-CM

## 2017-06-22 DIAGNOSIS — I119 Hypertensive heart disease without heart failure: Secondary | ICD-10-CM

## 2017-06-22 NOTE — Progress Notes (Signed)
PCP: Deland Pretty, MD Primary Cardiologist: Dr Nena Jordan P Alison Bell is a 71 y.o. female who presents today for routine electrophysiology followup.  Since his recent afib ablation, the patient reports doing very well.  she denies procedure related complications and is pleased with the results of the procedure.  Today, she denies symptoms of palpitations, chest pain, shortness of breath,  lower extremity edema, dizziness, presyncope, or syncope.  The patient is otherwise without complaint today.  She is mourning the death of her husband.  He died suddenly at work 21-Jun-2017.  Past Medical History:  Diagnosis Date  . Anemia   . Arthritis    rheumatoid  . Colon adenoma   . Depression   . Diabetes mellitus without complication ([place])   . Difficulty sleeping   . GERD (gastroesophageal reflux disease)   . Headache    migraines  . History of blood transfusion   . HTN (hypertension)   . Hyperlipidemia   . LV dysfunction, EF 45-50% due to a. fib per echo 08/27/12  08/28/2012   a. Echo (10/15):  EF 55-60%, no RWMA, Gr 2 DD, mild MR, mod LAE, normal RVF, mild RAE, mild TR (LA 44 mm)  . Persistent atrial fibrillation ([place])   . Torsades de pointes [facility])    due to [facility] 04/2014   Past Surgical History:  Procedure Laterality Date  . ATRIAL FIBRILLATION ABLATION N/A 11/02/2012   PVI by Dr [name]  . ATRIAL FIBRILLATION ABLATION N/A 03/17/2017   Procedure: Atrial Fibrillation Ablation;  Surgeon: [name], MD;  Location: [place] CV LAB;  Service: Cardiovascular;  Laterality: N/A;  . CARDIOVERSION N/A 09/06/2012   Procedure: CARDIOVERSION;  Surgeon: [name], MD;  Location: [place];  Service: Cardiovascular;  Laterality: N/A;  . CARDIOVERSION N/A 01/07/2013   Procedure: CARDIOVERSION;  Surgeon: [name], MD;  Location: [place] ENDOSCOPY;  Service: Cardiovascular;  Laterality: N/A;  . CARDIOVERSION N/A 11/14/2013   Procedure: CARDIOVERSION;  Surgeon: [name], MD;  Location: [facility]  ENDOSCOPY;  Service: Cardiovascular;  Laterality: N/A;  . CARDIOVERSION N/A 01/19/2017   Procedure: CARDIOVERSION;  Surgeon: [name], MD;  Location: [facility] ENDOSCOPY;  Service: Cardiovascular;  Laterality: N/A;  . COLONOSCOPY N/A 07/28/2014   Procedure: COLONOSCOPY;  Surgeon: Ladene Artist, MD;  Location: [facility] ENDOSCOPY;  Service: Endoscopy;  Laterality: N/A;  . LAPAROSCOPIC SIGMOID COLECTOMY N/A 08/08/2014   Procedure: LAPAROSCOPIC SIGMOID COLECTOMY Syl[facility] Farrier PROCTOSCOPY;  Surgeon: Excell Seltzer, MD;  Location: [facility] ORS;  Service: General;  Laterality: N/A;  . TEE WITHOUT CARDIOVERSION N/A 09/06/2012   Procedure: TRANSESOPHAGEAL ECHOCARDIOGRAM (TEE);  Surgeon: [name], MD;  Location: [facility] ENDOSCOPY;  Service: Cardiovascular;  Laterality: N/A;  . TEE WITHOUT CARDIOVERSION N/A 11/01/2012   Procedure: TRANSESOPHAGEAL ECHOCARDIOGRAM (TEE);  Surgeon: [name], MD;  Location: [facility] ENDOSCOPY;  Service: Cardiovascular;  Laterality: N/A;  . TUBAL LIGATION     bilateral    ROS- all systems are personally reviewed and negatives except as per HPI above  Current Outpatient Medications  Medication Sig Dispense Refill  . amitriptyline (ELAVIL) 25 MG tablet Take 25 mg by mouth at bedtime.   3  . atorvastatin (LIPITOR) 10 MG tablet Take 10 mg by mouth at bedtime.     . cetirizine (ZYRTEC) 10 MG tablet Take 10 mg by mouth daily.    . Cholecalciferol (VITAMIN D3) 2000 units TABS Take 2,000 Units by mouth daily.    . cyanocobalamin (CVS VITAMIN B12) 2000 MCG tablet  Take 2,000 mcg by mouth daily.    Marland Kitchen ELIQUIS 5 MG TABS tablet TAKE 1 TABLET BY MOUTH TWICE DAILY. 180 tablet 2  . famotidine (PEPCID) 20 MG tablet Take 20 mg by mouth 2 (two) times daily.    . folic acid (FOLVITE) 1 MG tablet Take 1 mg by mouth daily.    . furosemide (LASIX) 40 MG tablet Take 1 tablet (40 mg total) by mouth daily. 30 tablet 6  . gabapentin (NEURONTIN) 300 MG capsule Take 300 mg by mouth 3 (three) times daily as needed (for  pain.).     Marland Kitchen HUMIRA PEN 40 MG/0.8ML PNKT Inject 40 mg into the skin every 14 (fourteen) days.     . Insulin Syringe-Needle U-100 (INSULIN SYRINGE 1CC/31GX5/16") 31G X 5/16" 1 ML MISC 1 each by Other route 2 (two) times daily.   0  . Lancets (ACCU-CHEK MULTICLIX) lancets 1 each by Other route as needed.   11  . Multiple Vitamin (MULTIVITAMIN WITH MINERALS) TABS Take 1 tablet by mouth daily.    Marland Kitchen NOVOLIN 70/30 RELION (70-30) 100 UNIT/ML injection Inject 35-50 Units into the skin 2 (two) times daily with a meal. 60 units in the morning and 35 units in the evening    . Polyethyl Glycol-Propyl Glycol (SYSTANE ULTRA) 0.4-0.3 % SOLN Place 1 drop into both eyes 3 (three) times daily as needed (for dry eyes.).    Marland Kitchen potassium chloride SA (K-DUR,KLOR-CON) 20 MEQ tablet Take 1 tablet (20 mEq total) by mouth daily. 30 tablet 6  . sertraline (ZOLOFT) 100 MG tablet Take 150 mg by mouth every evening.   1  . traZODone (DESYREL) 50 MG tablet Take 50 mg by mouth at bedtime.     No current facility-administered medications for this visit.     Physical Exam: Vitals:   06/22/17 1433  Pulse: 64  SpO2: 96%  Weight: 218 lb (98.9 kg)  Height: 5\' 3"  (1.6 m)    GEN- The patient is overweight appearing, alert and oriented x 3 today.   Head- normocephalic, atraumatic Eyes-  Sclera clear, conjunctiva pink Ears- hearing intact Oropharynx- clear Lungs- Clear to ausculation bilaterally, normal work of breathing Heart- Regular rate and rhythm, no murmurs, rubs or gallops, PMI not laterally displaced GI- soft, NT, ND, + BS Extremities- no clubbing, cyanosis, or edema  EKG tracing ordered today is personally reviewed and shows sinus rhythm with RBBB  Assessment and Plan:  1. Persistent atrial fibrillation Doing well s/p ablation off AAD therapy chads2vasc score is 4 No changes at this time  2. Obesity Body mass index is 38.62 kg/m. Lifestyle modification encouraged  3. Hypertensive cardiovascular  disease Stable No change required today  Return to see Butch Penny in the AF clinic in 3 months I will see in 6 months  [name] MD, [facility] 06/22/2017 2:38 PM

## 2017-06-22 NOTE — Patient Instructions (Signed)
Medication Instructions:  Your physician recommends that you continue on your current medications as directed. Please refer to the Current Medication list given to you today.   Labwork: None ordered   Testing/Procedures: None ordered   Follow-Up:  Your physician recommends that you schedule a follow-up appointment in: 3 months with Donna Carroll, NP and 6 months with Dr Allred   Any Other Special Instructions Will Be Listed Below (If Applicable).     If you need a refill on your cardiac medications before your next appointment, please call your pharmacy.   

## 2017-06-23 ENCOUNTER — Encounter: Payer: Self-pay | Admitting: Diagnostic Neuroimaging

## 2017-06-23 ENCOUNTER — Ambulatory Visit (INDEPENDENT_AMBULATORY_CARE_PROVIDER_SITE_OTHER): Payer: PPO | Admitting: Diagnostic Neuroimaging

## 2017-06-23 VITALS — BP 111/74 | HR 56 | Ht 63.0 in | Wt 220.6 lb

## 2017-06-23 DIAGNOSIS — R269 Unspecified abnormalities of gait and mobility: Secondary | ICD-10-CM

## 2017-06-23 DIAGNOSIS — R259 Unspecified abnormal involuntary movements: Secondary | ICD-10-CM

## 2017-06-23 DIAGNOSIS — G252 Other specified forms of tremor: Secondary | ICD-10-CM

## 2017-06-23 DIAGNOSIS — E0590 Thyrotoxicosis, unspecified without thyrotoxic crisis or storm: Secondary | ICD-10-CM | POA: Diagnosis not present

## 2017-06-23 NOTE — Progress Notes (Signed)
[facility]  PATIENT: Alison Bell DOB: 1946-04-24  REFERRING CLINICIAN: Audie Pinto HISTORY FROM: patient and chart review REASON FOR VISIT: new consult    HISTORICAL  CHIEF COMPLAINT:  Chief Complaint  Patient presents with  . Tremors    rm 7, New Pt, dgtr- Wendy    HISTORY OF PRESENT ILLNESS:   71 year old left-handed female here for evaluation of tremor.  History of diabetes,  heart disease, atrial fibrillation migraine and depression.  Patient has had intermittent tremors, postural and action, 3 years ago.  This is worse with activities and certain positions.  In last 2 months this is significantly worsened.  This is affecting her quality of life and day-to-day activities.  No family history of tremor.  No family history of Parkinson's disease.  Patient uses a Rollator walker for gait difficulty.  She uses home oxygen.  Patient has been diagnosed with hyperthyroidism, and has follow-up with endocrinology.  Patient feels that her tremor has worsened in the same timeframe that her thyroid function has been abnormal.    REVIEW OF SYSTEMS: Full 14 system review of systems performed and negative with exception of: Weight gain fatigue blurred vision shortness of breath wheezing feeling cold memory loss headaches snoring restless legs depression not enough sleep allergies.  ALLERGIES: Allergies  Allergen Reactions  . Benicar [Olmesartan] Anaphylaxis  . Latex Hives  . Other Itching and Swelling    White meat chicken causes hands and feet to itch and swell.  Phyllis Ginger [Dofetilide] Other (See Comments)    Torsades after 1 dose of Tikosyn without significant QT prolongation  . Altace [Ramipril]     unknown  . Maxzide [Hydrochlorothiazide W-Triamterene]     unknown  . Tekturna [Aliskiren]     unknown    HOME MEDICATIONS: Outpatient Medications Prior to Visit  Medication Sig Dispense Refill  . amitriptyline (ELAVIL) 25 MG tablet Take 25 mg by mouth at bedtime.    3  . atorvastatin (LIPITOR) 10 MG tablet Take 10 mg by mouth at bedtime.     . cetirizine (ZYRTEC) 10 MG tablet Take 10 mg by mouth daily.    . Cholecalciferol (VITAMIN D3) 2000 units TABS Take 2,000 Units by mouth daily.    . cyanocobalamin ([employer]S VITAMIN B12) 2000 MCG tablet Take 2,000 mcg by mouth daily.    Marland Kitchen ELIQUIS 5 MG TABS tablet TAKE 1 TABLET BY MOUTH TWICE DAILY. 180 tablet 2  . famotidine (PEPCID) 20 MG tablet Take 20 mg by mouth 2 (two) times daily.    . folic acid (FOLVITE) 1 MG tablet Take 1 mg by mouth daily.    . furosemide (LASIX) 40 MG tablet Take 1 tablet (40 mg total) by mouth daily. 30 tablet 6  . gabapentin (NEURONTIN) 300 MG capsule Take 300 mg by mouth 3 (three) times daily as needed (for pain.).     Marland Kitchen HUMIRA PEN 40 MG/0.8ML PNKT Inject 40 mg into the skin every 14 (fourteen) days.     . Insulin Syringe-Needle U-100 (INSULIN SYRINGE 1CC/31GX5/16") 31G X 5/16" 1 ML MISC 1 each by Other route 2 (two) times daily.   0  . Lancets (ACCU-CHEK MULTICLIX) lancets 1 each by Other route as needed.   11  . LORazepam (ATIVAN) 0.5 MG tablet TK 1 T PO TID PRN  0  . Multiple Vitamin (MULTIVITAMIN WITH MINERALS) TABS Take 1 tablet by mouth daily.    Marland Kitchen NOVOLIN 70/30 RELION (70-30) 100 UNIT/ML injection Inject 35-50  Units into the skin 2 (two) times daily with a meal. 60 units in the morning and 35 units in the evening    . Polyethyl Glycol-Propyl Glycol (SYSTANE ULTRA) 0.4-0.3 % SOLN Place 1 drop into both eyes 3 (three) times daily as needed (for dry eyes.).    Marland Kitchen potassium chloride SA (K-DUR,KLOR-CON) 20 MEQ tablet Take 1 tablet (20 mEq total) by mouth daily. 30 tablet 6  . sertraline (ZOLOFT) 100 MG tablet Take 150 mg by mouth every evening.   1  . traZODone (DESYREL) 50 MG tablet Take 50 mg by mouth at bedtime.     No facility-administered medications prior to visit.     PAST MEDICAL HISTORY: Past Medical History:  Diagnosis Date  . Anemia   . Arthritis    rheumatoid  . Colon  adenoma   . Depression   . Diabetes mellitus without complication ([place])   . Difficulty sleeping   . GERD (gastroesophageal reflux disease)   . Headache    migraines  . History of blood transfusion   . HTN (hypertension)   . Hyperlipidemia   . LV dysfunction, EF 45-50% due to a. fib per echo 08/27/12  08/28/2012   a. Echo (10/15):  EF 55-60%, no RWMA, Gr 2 DD, mild MR, mod LAE, normal RVF, mild RAE, mild TR (LA 44 mm)  . Persistent atrial fibrillation ([place])   . Torsades de pointes ([place])    due to [facility] 04/2014    PAST SURGICAL HISTORY: Past Surgical History:  Procedure Laterality Date  . ATRIAL FIBRILLATION ABLATION N/A 11/02/2012   PVI by Dr [name]  . ATRIAL FIBRILLATION ABLATION N/A 03/17/2017   Procedure: Atrial Fibrillation Ablation;  Surgeon: [name], MD;  Location: [facility] LAB;  Service: Cardiovascular;  Laterality: N/A;  . CARDIOVERSION N/A 09/06/2012   Procedure: CARDIOVERSION;  Surgeon: [name], MD;  Location: [facility];  Service: Cardiovascular;  Laterality: N/A;  . CARDIOVERSION N/A 01/07/2013   Procedure: CARDIOVERSION;  Surgeon: [name], MD;  Location: [facility] ENDOSCOPY;  Service: Cardiovascular;  Laterality: N/A;  . CARDIOVERSION N/A 11/14/2013   Procedure: CARDIOVERSION;  Surgeon: [name], MD;  Location: [facility] ENDOSCOPY;  Service: Cardiovascular;  Laterality: N/A;  . CARDIOVERSION N/A 01/19/2017   Procedure: CARDIOVERSION;  Surgeon: [name], MD;  Location: [facility] ENDOSCOPY;  Service: Cardiovascular;  Laterality: N/A;  . COLONOSCOPY N/A 07/28/2014   Procedure: COLONOSCOPY;  Surgeon: Ladene Artist, MD;  Location: [facility] ENDOSCOPY;  Service: Endoscopy;  Laterality: N/A;  . LAPAROSCOPIC SIGMOID COLECTOMY N/A 08/08/2014   Procedure: LAPAROSCOPIC SIGMOID COLECTOMY [facility] PROCTOSCOPY;  Surgeon: Excell Seltzer, MD;  Location: [facility] ORS;  Service: General;  Laterality: N/A;  . TEE WITHOUT CARDIOVERSION N/A 09/06/2012   Procedure: TRANSESOPHAGEAL  ECHOCARDIOGRAM (TEE);  Surgeon: [name], MD;  Location: [facility] ENDOSCOPY;  Service: Cardiovascular;  Laterality: N/A;  . TEE WITHOUT CARDIOVERSION N/A 11/01/2012   Procedure: TRANSESOPHAGEAL ECHOCARDIOGRAM (TEE);  Surgeon: [name], MD;  Location: [facility] ENDOSCOPY;  Service: Cardiovascular;  Laterality: N/A;  . TUBAL LIGATION     bilateral    FAMILY HISTORY: Family History  Problem Relation Age of Onset  . Heart attack Mother   . Breast cancer Mother   . Diabetes type II Father   . Hypertension Father   . Throat cancer Brother   . Heart attack Paternal Grandmother   . Heart attack Maternal Grandfather   . Heart attack Maternal Grandmother     SOCIAL HISTORY:  Social  History   Socioeconomic History  . Marital status: Married    Spouse name: Not on file  . Number of children: 4  . Years of education: 33  . Highest education level: Not on file  Social Needs  . Financial resource strain: Not on file  . Food insecurity - worry: Not on file  . Food insecurity - inability: Not on file  . Transportation needs - medical: Not on file  . Transportation needs - non-medical: Not on file  Occupational History  . Not on file  Tobacco Use  . Smoking status: Former Smoker    Last attempt to quit: 07/21/1994    Years since quitting: 22.9  . Smokeless tobacco: Never Used  Substance and Sexual Activity  . Alcohol use: No  . Drug use: No  . Sexual activity: Yes  Other Topics Concern  . Not on file  Social History Narrative   Lives in [place], daughter with her.  Retired   Four children   Caffeine -none   high school     PHYSICAL EXAM  GENERAL EXAM/CONSTITUTIONAL: Vitals:  Vitals:   06/23/17 1258  BP: 111/74  Pulse: (!) 56  Weight: 220 lb 9.6 oz (100.1 kg)  Height: 5\' 3"  (1.6 m)     Body mass index is 39.08 kg/m.  No exam data present  Patient is in no distress; well developed, nourished and groomed; neck is supple  02 VIA  [place]  CARDIOVASCULAR:  Examination of carotid arteries is normal; no carotid bruits  Regular rate and rhythm, no murmurs  Examination of peripheral vascular system by observation and palpation is normal  EYES:  Ophthalmoscopic exam of optic discs and posterior segments is normal; no papilledema or hemorrhages  MUSCULOSKELETAL:  Gait, strength, tone, movements noted in Neurologic exam below  NEUROLOGIC: MENTAL STATUS:  No flowsheet data found.  awake, alert, oriented to person, place and time  recent and remote memory intact  normal attention and concentration  language fluent, comprehension intact, naming intact,   fund of knowledge appropriate  CRANIAL NERVE:   2nd - no papilledema on fundoscopic exam  2nd, 3rd, 4th, 6th - pupils equal and reactive to light, visual fields full to confrontation, extraocular muscles intact, no nystagmus  5th - facial sensation symmetric  7th - facial strength symmetric  8th - hearing intact  9th - palate elevates symmetrically, uvula midline  11th - shoulder shrug symmetric  12th - tongue protrusion midline  MOTOR:   POSTURAL / ACTION TREMOR BUE  VERY SUBTLE RESTING TREMOR IN LUE  INT MILD MOUTH TREMOR  MODERATE BRADYKINESIA IN RUE > LUE; MILD BRADYKINESIA IN BLE  MILD COGWHEELING IN RUE > LUE  normal bulk and tone, full strength in the BUE, BLE  SENSORY:   normal and symmetric to light touch, temperature; DECR VIB AT TOES  COORDINATION:   finger-nose-finger, fine finger movements --> SLOW  REFLEXES:   deep tendon reflexes TRACE and symmetric  NEG SNOUT AND ROOTING REFLEXES  GAIT/STATION:   narrow based gait; STOOPED POSTURE; USING ROLLATOR WALKER    DIAGNOSTIC DATA (LABS, IMAGING, TESTING) - I reviewed patient records, labs, notes, testing and imaging myself where available.  Lab Results  Component Value Date   WBC 9.5 03/05/2017   HGB 13.6 03/05/2017   HCT 40.0 03/05/2017   MCV 92 03/05/2017    PLT 374 03/05/2017      Component Value Date/Time   NA 140 03/20/2017 0257   NA 140 03/05/2017 1125  K 4.0 03/20/2017 0257   CL 100 (L) 03/20/2017 0257   CO2 33 (H) 03/20/2017 0257   GLUCOSE 187 (H) 03/20/2017 0257   BUN 20 03/20/2017 0257   BUN 23 03/05/2017 1125   CREATININE 1.21 (H) 03/20/2017 0257   CREATININE 1.03 01/06/2013 1210   CALCIUM 9.5 03/20/2017 0257   PROT 7.1 02/08/2015 1009   ALBUMIN 3.5 02/08/2015 1009   AST 18 02/08/2015 1009   ALT 13 (L) 02/08/2015 1009   ALKPHOS 60 02/08/2015 1009   BILITOT 0.3 02/08/2015 1009   GFRNONAA 43 (L) 03/20/2017 0257   GFRAA 50 (L) 03/20/2017 0257   Lab Results  Component Value Date   CHOL 153 05/02/2014   HDL 56 05/02/2014   LDLCALC 74 05/02/2014   TRIG 116 05/02/2014   CHOLHDL 2.7 05/02/2014   Lab Results  Component Value Date   HGBA1C 5.6 08/04/2014   No results found for: [id number] Lab Results  Component Value Date   TSH 0.291 (L) 08/30/2015       ASSESSMENT AND PLAN  71 y.o. year old female here with tremors since 2015; worse in 2018.  Patient has postural and action tremor, but also subtle resting tremor with Parkinson's symptoms.  Could represent essential tremor or enhanced physiologic tremor starting in 2015, now with superimposed parkinsonism.   Ddx: essential tremor, parkinsonism, hyperthyroidism, stress / anxiety  1. Mixed action and resting tremor   2. Postural tremor   3. Hyperthyroidism   4. Gait difficulty      PLAN:  - monitor symptoms - may consider empiric trial for essential tremor or parkinsonism at next visit (patient would like to hold off for now) - may consider MRI brain (patient would like to hold off for now) - follow up thyroid evaluation with Dr. [name]  Return in about 4 months (around 10/22/2017).    Penni Bombard, MD 42/01/622, 7:62 PM Certified in Neurology, Neurophysiology and Neuroimaging  [facility] [address] [place], [place] [address] [phone]

## 2017-06-23 NOTE — Patient Instructions (Signed)
-   monitor symptoms  - may consider medication for essential tremor or parkinsonism at next visit   - may consider MRI brain  - follow up thyroid evaluation with Dr. [name]

## 2017-06-24 ENCOUNTER — Telehealth: Payer: Self-pay | Admitting: Internal Medicine

## 2017-06-24 DIAGNOSIS — R0902 Hypoxemia: Secondary | ICD-10-CM

## 2017-06-24 NOTE — Telephone Encounter (Signed)
New message     Under the CMN please cross out Renee name and NPI number and put Dr Jackalyn Lombard name and NPI   Please initial and date by both after you cross those out, otherwise it will not be valid.  Renee (discharged her from hospital) is requesting the patient be put on oxygen and medicare has to have this paperwork filled out and signed,

## 2017-06-25 NOTE — Telephone Encounter (Signed)
Dr [name] signed form and asked that I refer the patient to pulmonary.  Patient is aware of the potential of referral.  Referral has been placed and form will be faxed back for O2 until she can get in with pulmonary.

## 2017-06-26 ENCOUNTER — Other Ambulatory Visit (HOSPITAL_COMMUNITY): Payer: Self-pay | Admitting: Endocrinology

## 2017-06-26 DIAGNOSIS — E0590 Thyrotoxicosis, unspecified without thyrotoxic crisis or storm: Secondary | ICD-10-CM

## 2017-07-07 ENCOUNTER — Telehealth: Payer: Self-pay | Admitting: *Deleted

## 2017-07-07 ENCOUNTER — Telehealth: Payer: Self-pay | Admitting: Physician Assistant

## 2017-07-07 ENCOUNTER — Encounter (HOSPITAL_COMMUNITY): Payer: PPO

## 2017-07-07 NOTE — Telephone Encounter (Signed)
°  Follow Up  Calling to follow up on Oxygen CMN for patients insurance. States form needed to be signed my Renee. Please call.

## 2017-07-07 NOTE — Telephone Encounter (Signed)
[place]

## 2017-07-08 ENCOUNTER — Encounter (HOSPITAL_COMMUNITY): Payer: PPO

## 2017-07-20 DIAGNOSIS — I4891 Unspecified atrial fibrillation: Secondary | ICD-10-CM | POA: Diagnosis not present

## 2017-07-20 DIAGNOSIS — I10 Essential (primary) hypertension: Secondary | ICD-10-CM | POA: Diagnosis not present

## 2017-07-20 DIAGNOSIS — I5033 Acute on chronic diastolic (congestive) heart failure: Secondary | ICD-10-CM | POA: Diagnosis not present

## 2017-07-23 ENCOUNTER — Encounter (HOSPITAL_COMMUNITY)
Admission: RE | Admit: 2017-07-23 | Discharge: 2017-07-23 | Disposition: A | Discharge status: Home / Self Care | Payer: PPO | Source: Ambulatory Visit | Attending: Endocrinology | Admitting: Endocrinology

## 2017-07-23 DIAGNOSIS — E0590 Thyrotoxicosis, unspecified without thyrotoxic crisis or storm: Secondary | ICD-10-CM | POA: Insufficient documentation

## 2017-07-23 MED ORDER — SODIUM IODIDE I 131 CAPSULE
16.0000 | Freq: Once | INTRAVENOUS | Status: AC | PRN
  Administered 2017-07-23: 16 via ORAL

## 2017-07-24 ENCOUNTER — Encounter (HOSPITAL_COMMUNITY)
Admission: RE | Admit: 2017-07-24 | Discharge: 2017-07-24 | Disposition: A | Discharge status: Home / Self Care | Payer: PPO | Source: Ambulatory Visit | Attending: Endocrinology | Admitting: Endocrinology

## 2017-07-24 DIAGNOSIS — E0590 Thyrotoxicosis, unspecified without thyrotoxic crisis or storm: Secondary | ICD-10-CM | POA: Diagnosis not present

## 2017-07-24 MED ORDER — SODIUM PERTECHNETATE TC 99M INJECTION
9.7700 | Freq: Once | INTRAVENOUS | Status: AC | PRN
  Administered 2017-07-24: 9.77 via INTRAVENOUS

## 2017-07-30 ENCOUNTER — Other Ambulatory Visit: Payer: Self-pay | Admitting: *Deleted

## 2017-07-30 NOTE — Patient Outreach (Signed)
[facility]) Care Management  07/30/2017  Kianah Harries Gesner 06-Oct-1945 [id number]  Referral via [employer]; reasons for referral-weight loss/gain, lack of knowledge about conditions, care coordination  Telephone call to patient who was advised of reason for referral & of [facility] care management services.  HIPPA verification received from patient.  Patient voices currently daughter lives with her and that she lost husband recently (Nov). States she has primary care provider & several specialists. States daughter provides transportation for her. Patient agrees to assessment questions however unable to complete due to family present & needs to talk to her. .   Patient agrees to call back to complete assessment & agrees with set time for call back.   Plan: Will follow up on set appointment.   Sherrin Daisy, RN BSN [place] Management Coordinator [facility] Care Management  [phone]

## 2017-07-31 ENCOUNTER — Other Ambulatory Visit: Payer: Self-pay | Admitting: *Deleted

## 2017-07-31 ENCOUNTER — Encounter: Payer: Self-pay | Admitting: *Deleted

## 2017-07-31 NOTE — Patient Outreach (Signed)
[place]) Care Management  07/31/2017  Jillane Po Penner 10-15-1945 [id number]  Referral via [employer]; reasons for referral-weight loss/gain, lack of knowledge about conditions, care coordination  Telephone call #2 to patient. Advised of [facility] care management services. HIPPA verification received.   States her major health conditions are atrial fibrillation, DM, neuropathy, Rheumatoid arthritis, HTN-controlled.  States she manages her medications & takes them as prescribed consistently. States she let Extra-Help lapse & is now trying to get re-en stated to help with purchase of Humira injections. States she has spoken with person who is helping her within the last 2 weeks & and plans to keep in contact with her.   Voices that her daughter takes her to all MD appointments.   [facility] services offered telephonic/community services for disease management & pharmacy assistance.  Patient declines services; states she feel comfortable managing her chronic conditions. States she will call back if needed. Thanked this care coordinator for calling.    Plan: Send [facility] contact information to patient. Send to care management assistant for case closure. Send MD closure letter.

## 2017-08-07 ENCOUNTER — Encounter: Payer: Self-pay | Admitting: Pulmonary Disease

## 2017-08-07 ENCOUNTER — Ambulatory Visit: Payer: PPO | Admitting: Pulmonary Disease

## 2017-08-07 DIAGNOSIS — R0602 Shortness of breath: Secondary | ICD-10-CM | POA: Diagnosis not present

## 2017-08-07 NOTE — Progress Notes (Signed)
   Subjective:    [name], female    DOB: [birth year], 72 y.o.   MRN: [id number]  HPI    Review of Systems  Constitutional: Negative for fever and unexpected weight change.  HENT: Negative for congestion, dental problem, ear pain, nosebleeds, postnasal drip, rhinorrhea, sinus pressure, sneezing, sore throat and trouble swallowing.   Eyes: Negative for redness and itching.  Respiratory: Positive for cough and shortness of breath. Negative for chest tightness and wheezing.   Cardiovascular: Positive for leg swelling. Negative for palpitations.  Gastrointestinal: Negative for nausea and vomiting.  Genitourinary: Negative for dysuria.  Musculoskeletal: Positive for joint swelling.  Skin: Negative for rash.  Allergic/Immunologic: Positive for environmental allergies. Negative for food allergies and immunocompromised state.  Neurological: Positive for headaches.  Hematological: Does not bruise/bleed easily.  Psychiatric/Behavioral: Negative for dysphoric mood. The patient is nervous/anxious.        Objective:   Physical Exam        Assessment & Plan:

## 2017-08-07 NOTE — Patient Instructions (Signed)
Will arrange for high resolution CT chest  Follow up in 2 to 3 weeks

## 2017-08-07 NOTE — Progress Notes (Signed)
[facility]  Chief Complaint  Patient presents with  . pulm consult    Pt referred by Dr. [name] MD. Pt is uses 2 liters pulse DME-[facility]. Pt has SOB at rest and with exertion' pt O2 sats drop quickly in 78-84 range.    Vital signs: BP 118/76 (BP Location: Left Arm, Cuff Size: Normal)   Pulse (!) 59   Ht 5\' 2"  (1.575 m)   Wt 216 lb (98 kg)   SpO2 95%   BMI 39.51 kg/m   History of Present Illness: Alison Bell is a 72 y.o. female former smoker with dyspnea.  She reports having cardiac ablation for a fib about 6 months ago.  Since then she has noticed trouble with her breathing.  She didn't feel like she had a breathing issue prior to this.  She gets winded wit minimal exertion.  She has also needed to use supplemental oxygen during this period.  She doesn't recall any other triggering events.    She denies cough, sputum, wheeze, fever, chest pain, palpitations, skin rash, or leg swelling.  No history of pneumonia, tuberculosis, COPD, or asthma.  She takes eliquis for history of A fib.  She has a history of rheumatoid arthritis.  She has been on humira for past 2 years.  She was on methotrexate previously.  Physical Exam:  General - pleasant Eyes - pupils reactive ENT - no sinus tenderness, no oral exudate, no LAN Cardiac - regular, no murmur Chest - no wheeze, rales Abd - soft, non tender Ext - no edema Skin - no rashes Neuro - normal strength Psych - normal mood   CMP Latest Ref Rng & Units 03/20/2017 03/19/2017 03/18/2017  Glucose 65 - 99 mg/dL 187(H) 147(H) 161(H)  BUN 6 - 20 mg/dL 20 12 14   Creatinine 0.44 - 1.00 mg/dL 1.21(H) 1.15(H) 1.11(H)  Sodium 135 - 145 mmol/L 140 138 137  Potassium 3.5 - 5.1 mmol/L 4.0 3.2(L) 3.7  Chloride 101 - 111 mmol/L 100(L) 98(L) 101  CO2 22 - 32 mmol/L 33(H) 31 27  Calcium 8.9 - 10.3 mg/dL 9.5 8.3(L) 8.5(L)  Total Protein 6.5 - 8.1 g/dL - - -  Total Bilirubin 0.3 - 1.2 mg/dL - - -  Alkaline  Phos 38 - 126 U/L - - -  AST 15 - 41 U/L - - -  ALT 14 - 54 U/L - - -    CBC Latest Ref Rng & Units 03/05/2017 01/13/2017 08/12/2014  WBC 3.4 - 10.8 x10E3/uL 9.5 9.9 7.3  Hemoglobin 11.1 - 15.9 g/dL 13.6 13.7 8.1(L)  Hematocrit 34.0 - 46.6 % 40.0 41.1 26.2(L)  Platelets 150 - 379 x10E3/uL 374 296 482(H)     Discussion: She has progressive dyspnea on exertion for the past 6 months.  She also has required supplemental oxygen.  Her PFTs show progressive drop in her diffusion capacity.  She has a history of rheumatoid arthritis.  Cardiac CT from last Summer showed area of ground glass.  Assessment/Plan:  Dyspnea on exertion with hypoxic respiratory failure. - will arrange for high resolution CT chest - depending on results will determine additional intervention - continue supplemental oxygen at 2 to 3 liters pulsed   Patient Instructions  Will arrange for high resolution CT chest  Follow up in 2 to 3 weeks    Chesley Mires, MD [place] 08/07/2017, 12:32 PM Pager:  [phone]  Flow Sheet  Pulmonary tests: PFT 06/05/15 >> FEV1 1.80 (88%), FEV1%  82, TLC4.21 (88%), DLCO 60% CT chest 03/13/17 >> GGO RML  PFT 04/23/17 >> FEV1 1.51 (76%), FEV1% 87, TLC 3.83 (80%), DLCO 39%  Cardiac tests: Echo 02/06/17 >> EF 60 to 65%, PAS 44 mmHg  Review of Systems: Constitutional: Negative for fever and unexpected weight change.  HENT: Negative for congestion, dental problem, ear pain, nosebleeds, postnasal drip, rhinorrhea, sinus pressure, sneezing, sore throat and trouble swallowing.   Eyes: Negative for redness and itching.  Respiratory: Positive for cough and shortness of breath. Negative for chest tightness and wheezing.   Cardiovascular: Positive for leg swelling. Negative for palpitations.  Gastrointestinal: Negative for nausea and vomiting.  Genitourinary: Negative for dysuria.  Musculoskeletal: Positive for joint swelling.  Skin: Negative for rash.    Allergic/Immunologic: Positive for environmental allergies. Negative for food allergies and immunocompromised state.  Neurological: Positive for headaches.  Hematological: Does not bruise/bleed easily.  Psychiatric/Behavioral: Negative for dysphoric mood. The patient is nervous/anxious.    Past Medical History: She  has a past medical history of Anemia, Arthritis, Colon adenoma, Depression, Diabetes mellitus without complication ([place]), Difficulty sleeping, GERD (gastroesophageal reflux disease), Headache, History of blood transfusion, HTN (hypertension), Hyperlipidemia, LV dysfunction, EF 45-50% due to a. fib per echo 08/27/12  (08/28/2012), Persistent atrial fibrillation ([place]), and Torsades de pointes ([place]).  Past Surgical History: She  has a past surgical history that includes Tubal ligation; TEE without cardioversion (N/A, 09/06/2012); Cardioversion (N/A, 09/06/2012); TEE without cardioversion (N/A, 11/01/2012); Cardioversion (N/A, 01/07/2013); Cardioversion (N/A, 11/14/2013); atrial fibrillation ablation (N/A, 11/02/2012); Colonoscopy (N/A, 07/28/2014); Laparoscopic sigmoid colectomy (N/A, 08/08/2014); Cardioversion (N/A, 01/19/2017); and ATRIAL FIBRILLATION ABLATION (N/A, 03/17/2017).  Family History: Her family history includes Breast cancer in her mother; Diabetes type II in her father; Heart attack in her maternal grandfather, maternal grandmother, mother, and paternal grandmother; Hypertension in her father; Throat cancer in her brother.  Social History: She  reports that she quit smoking about 23 years ago. she has never used smokeless tobacco. She reports that she does not drink alcohol or use drugs.  Medications: Allergies as of 08/07/2017      Reactions   Benicar [olmesartan] Anaphylaxis   Latex Hives   Other Itching, Swelling   White meat chicken causes hands and feet to itch and swell.   Tikosyn [dofetilide] Other (See Comments)   Torsades after 1 dose of Tikosyn without significant QT  prolongation   Altace [ramipril]    unknown   Maxzide [hydrochlorothiazide W-triamterene]    unknown   Tekturna [aliskiren]    unknown      Medication List        Accurate as of 08/07/17 12:32 PM. Always use your most recent med list.          accu-chek multiclix lancets 1 each by Other route as needed.   amitriptyline 25 MG tablet Commonly known as:  ELAVIL Take 25 mg by mouth at bedtime.   atorvastatin 10 MG tablet Commonly known as:  LIPITOR Take 10 mg by mouth at bedtime.   cetirizine 10 MG tablet Commonly known as:  ZYRTEC Take 10 mg by mouth daily.   [employer]S VITAMIN B12 2000 MCG tablet Generic drug:  cyanocobalamin Take 2,000 mcg by mouth daily.   ELIQUIS 5 MG Tabs tablet Generic drug:  apixaban TAKE 1 TABLET BY MOUTH TWICE DAILY.   famotidine 20 MG tablet Commonly known as:  PEPCID Take 20 mg by mouth 2 (two) times daily.   folic acid 1 MG tablet Commonly known as:  [employer]  Take 1 mg by mouth daily.   furosemide 40 MG tablet Commonly known as:  LASIX Take 1 tablet (40 mg total) by mouth daily.   gabapentin 300 MG capsule Commonly known as:  NEURONTIN Take 300 mg by mouth 3 (three) times daily as needed (for pain.).   HUMIRA PEN 40 MG/0.8ML Pnkt Generic drug:  Adalimumab Inject 40 mg into the skin every 14 (fourteen) days.   INSULIN SYRINGE 1CC/31GX5/16" 31G X 5/16" 1 ML Misc 1 each by Other route 2 (two) times daily.   LORazepam 0.5 MG tablet Commonly known as:  ATIVAN TK 1 T PO TID PRN   multivitamin with minerals Tabs tablet Take 1 tablet by mouth daily.   NOVOLIN 70/30 RELION (70-30) 100 UNIT/ML injection Generic drug:  insulin NPH-regular Human Inject 35-50 Units into the skin 2 (two) times daily with a meal. 60 units in the morning and 35 units in the evening   potassium chloride SA 20 MEQ tablet Commonly known as:  K-DUR,KLOR-CON Take 1 tablet (20 mEq total) by mouth daily.   sertraline 100 MG tablet Commonly known as:   ZOLOFT Take 150 mg by mouth every evening.   SYSTANE ULTRA 0.4-0.3 % Soln Generic drug:  Polyethyl Glycol-Propyl Glycol Place 1 drop into both eyes 3 (three) times daily as needed (for dry eyes.).   traZODone 50 MG tablet Commonly known as:  DESYREL Take 50 mg by mouth at bedtime.   Vitamin D3 2000 units Tabs Take 2,000 Units by mouth daily.

## 2017-08-14 DIAGNOSIS — M175 Other unilateral secondary osteoarthritis of knee: Secondary | ICD-10-CM | POA: Diagnosis not present

## 2017-08-14 DIAGNOSIS — M545 Low back pain: Secondary | ICD-10-CM | POA: Diagnosis not present

## 2017-08-19 ENCOUNTER — Ambulatory Visit (INDEPENDENT_AMBULATORY_CARE_PROVIDER_SITE_OTHER)
Admission: RE | Admit: 2017-08-19 | Discharge: 2017-08-19 | Disposition: A | Discharge status: Home / Self Care | Payer: PPO | Source: Ambulatory Visit | Attending: Pulmonary Disease | Admitting: Pulmonary Disease

## 2017-08-19 DIAGNOSIS — R0602 Shortness of breath: Secondary | ICD-10-CM

## 2017-08-19 DIAGNOSIS — J439 Emphysema, unspecified: Secondary | ICD-10-CM | POA: Diagnosis not present

## 2017-08-20 DIAGNOSIS — I5033 Acute on chronic diastolic (congestive) heart failure: Secondary | ICD-10-CM | POA: Diagnosis not present

## 2017-08-20 DIAGNOSIS — I4891 Unspecified atrial fibrillation: Secondary | ICD-10-CM | POA: Diagnosis not present

## 2017-08-20 DIAGNOSIS — I10 Essential (primary) hypertension: Secondary | ICD-10-CM | POA: Diagnosis not present

## 2017-08-25 ENCOUNTER — Ambulatory Visit: Payer: PPO | Admitting: Pulmonary Disease

## 2017-08-25 ENCOUNTER — Ambulatory Visit (INDEPENDENT_AMBULATORY_CARE_PROVIDER_SITE_OTHER): Payer: PPO | Admitting: Adult Health

## 2017-08-25 ENCOUNTER — Encounter: Payer: Self-pay | Admitting: Adult Health

## 2017-08-25 DIAGNOSIS — R9389 Abnormal findings on diagnostic imaging of other specified body structures: Secondary | ICD-10-CM

## 2017-08-25 DIAGNOSIS — J9611 Chronic respiratory failure with hypoxia: Secondary | ICD-10-CM | POA: Diagnosis not present

## 2017-08-25 DIAGNOSIS — J439 Emphysema, unspecified: Secondary | ICD-10-CM

## 2017-08-25 NOTE — Progress Notes (Signed)
@Patient  ID: Alison Bell, female    DOB: [birth year], 72 y.o.   MRN: [id number]  Chief Complaint  Patient presents with  . Follow-up    Referring provider: Deland Pretty, MD  HPI: 72 year old female former smoker (heavy smoker)  seen for pulmonary consult August 07, 2017 for progressive dyspnea.  For 6 months on Oxygen 2-3 L/m (started on 02/2018 after ablation)  Past medical history significant for A. fib status post cardiac ablation, rheumatoid arthritis on Humira.  Previously on methotrexate Diastolic CHF    Pulmonary tests: PFT 06/05/15 >> FEV1 1.80 (88%), FEV1% 82, TLC4.21 (88%), DLCO 60% CT chest 03/13/17 >> GGO RML  PFT 04/23/17 >> FEV1 1.51 (76%), FEV1% 87, TLC 3.83 (80%), DLCO 39%  Cardiac tests: Echo 02/06/17 >> EF 60 to 65%, PAS 44 mmHg  08/25/17 Follow up : Dyspnea, O2 RF  Patient presents for a 2-week follow-up.  Patient was seen last visit for a pulmonary consult for progressive dyspnea over the last 6 months.  Patient was on oxygen 2 L.  She had been noticing increased shortness of breath getting worse for the last several months.  She does have a history of rheumatoid arthritis on Humira.  She also was on previous methotrexate.  Pulmonary function test showed progressive decline in her diffusing capacity.  With no significant airflow obstruction.  And mild restriction Patient was set up for a high resolution CT chest that was done on August 19, 2017 that showed very mild basilar subpleural fibrosis with reticulation, groundglass and traction bronchiectasis., mild to moderate emphysema .  Echo last year showed a preserved EF .   And pulmonary artery pressure elevated at 44 mmHg. Weight is up 40 pounds over the last 2 years.  Says she had home sleep study 2 yrs ago that she was told that was negative.     Allergies  Allergen Reactions  . Benicar [Olmesartan] Anaphylaxis  . Latex Hives  . Other Itching and Swelling    White meat chicken causes hands and feet to  itch and swell.  Phyllis Ginger [Dofetilide] Other (See Comments)    Torsades after 1 dose of Tikosyn without significant QT prolongation  . Altace [Ramipril]     unknown  . Maxzide [Hydrochlorothiazide W-Triamterene]     unknown  . Tekturna [Aliskiren]     unknown    Immunization History  Administered Date(s) Administered  . Influenza Whole 06/03/2017  . Influenza,inj,Quad PF,6+ Mos 05/03/2014    Past Medical History:  Diagnosis Date  . Anemia   . Arthritis    rheumatoid  . Colon adenoma   . Depression   . Diabetes mellitus without complication ([place])   . Difficulty sleeping   . GERD (gastroesophageal reflux disease)   . Headache    migraines  . History of blood transfusion   . HTN (hypertension)   . Hyperlipidemia   . LV dysfunction, EF 45-50% due to a. fib per echo 08/27/12  08/28/2012   a. Echo (10/15):  EF 55-60%, no RWMA, Gr 2 DD, mild MR, mod LAE, normal RVF, mild RAE, mild TR (LA 44 mm)  . Persistent atrial fibrillation ([place])   . Torsades de pointes ([place])    due to Tikosyn 04/2014    Tobacco History: Social History   Tobacco Use  Smoking Status Former Smoker  . Last attempt to quit: 07/21/1994  . Years since quitting: 23.1  Smokeless Tobacco Never Used   Counseling given: Not Answered   Outpatient Encounter  Medications as of 08/25/2017  Medication Sig  . amitriptyline (ELAVIL) 25 MG tablet Take 25 mg by mouth at bedtime.   Marland Kitchen atorvastatin (LIPITOR) 10 MG tablet Take 10 mg by mouth at bedtime.   . cetirizine (ZYRTEC) 10 MG tablet Take 10 mg by mouth daily.  . Cholecalciferol (VITAMIN D3) 2000 units TABS Take 2,000 Units by mouth daily.  . cyanocobalamin (CVS VITAMIN B12) 2000 MCG tablet Take 2,000 mcg by mouth daily.  Marland Kitchen ELIQUIS 5 MG TABS tablet TAKE 1 TABLET BY MOUTH TWICE DAILY.  . famotidine (PEPCID) 20 MG tablet Take 20 mg by mouth 2 (two) times daily.  . folic acid (FOLVITE) 1 MG tablet Take 1 mg by mouth daily.  . furosemide (LASIX) 40 MG tablet Take 1 tablet  (40 mg total) by mouth daily.  Marland Kitchen gabapentin (NEURONTIN) 300 MG capsule Take 300 mg by mouth 3 (three) times daily as needed (for pain.).   Marland Kitchen HUMIRA PEN 40 MG/0.8ML PNKT Inject 40 mg into the skin every 14 (fourteen) days.   . Insulin Syringe-Needle U-100 (INSULIN SYRINGE 1CC/31GX5/16") 31G X 5/16" 1 ML MISC 1 each by Other route 2 (two) times daily.   . Lancets (ACCU-CHEK MULTICLIX) lancets 1 each by Other route as needed.   Marland Kitchen LORazepam (ATIVAN) 0.5 MG tablet TK 1 T PO TID PRN  . Multiple Vitamin (MULTIVITAMIN WITH MINERALS) TABS Take 1 tablet by mouth daily.  Marland Kitchen NOVOLIN 70/30 RELION (70-30) 100 UNIT/ML injection Inject 35-50 Units into the skin 2 (two) times daily with a meal. 60 units in the morning and 35 units in the evening  . Polyethyl Glycol-Propyl Glycol (SYSTANE ULTRA) 0.4-0.3 % SOLN Place 1 drop into both eyes 3 (three) times daily as needed (for dry eyes.).  Marland Kitchen potassium chloride SA (K-DUR,KLOR-CON) 20 MEQ tablet Take 1 tablet (20 mEq total) by mouth daily.  . sertraline (ZOLOFT) 100 MG tablet Take 150 mg by mouth every evening.   . traZODone (DESYREL) 50 MG tablet Take 50 mg by mouth at bedtime.  . methimazole (TAPAZOLE) 10 MG tablet TK 1 T PO QD WF   No facility-administered encounter medications on file as of 08/25/2017.      Review of Systems  Constitutional:   No  weight loss, night sweats,  Fevers, chills, + fatigue, or  lassitude.  HEENT:   No headaches,  Difficulty swallowing,  Tooth/dental problems, or  Sore throat,                No sneezing, itching, ear ache, nasal congestion, post nasal drip,   CV:  No chest pain,  Orthopnea, PND, swelling in lower extremities, anasarca, dizziness, palpitations, syncope.   GI  No heartburn, indigestion, abdominal pain, nausea, vomiting, diarrhea, change in bowel habits, loss of appetite, bloody stools.   Resp:   No excess mucus, no productive cough,  No non-productive cough,  No coughing up of blood.  No change in color of mucus.  No  wheezing.  No chest wall deformity  Skin: no rash or lesions.  GU: no dysuria, change in color of urine, no urgency or frequency.  No flank pain, no hematuria   MS:  No joint pain or swelling.  No decreased range of motion.  No back pain.    Physical Exam  BP 136/74 (BP Location: Left Arm, Cuff Size: Normal)   Pulse 64   Ht 5\' 2"  (1.575 m)   Wt 216 lb 9.6 oz (98.2 kg)   SpO2 94%  BMI 39.62 kg/m   GEN: A/Ox3; pleasant , NAD, obese, elderly, on O2 in wheelchair   HEENT:  Electra/AT,  EACs-clear, TMs-wnl, NOSE-clear, THROAT-clear, no lesions, no postnasal drip or exudate noted.   NECK:  Supple w/ fair ROM; no JVD; normal carotid impulses w/o bruits; no thyromegaly or nodules palpated; no lymphadenopathy.    RESP  Clear  P & A; w/o, wheezes/ rales/ or rhonchi. no accessory muscle use, no dullness to percussion  CARD:  RRR, no m/r/g, no peripheral edema, pulses intact, no cyanosis or clubbing.  GI:   Soft & nt; nml bowel sounds; no organomegaly or masses detected.   Musco: Warm bil, no deformities or joint swelling noted.   Neuro: alert, no focal deficits noted.    Skin: Warm, no lesions or rashes    Lab Results:  CBC    Component Value Date/Time   WBC 9.5 03/05/2017 1125   WBC 9.9 01/13/2017 1104   RBC 4.35 03/05/2017 1125   RBC 4.49 01/13/2017 1104   HGB 13.6 03/05/2017 1125   HCT 40.0 03/05/2017 1125   PLT 374 03/05/2017 1125   MCV 92 03/05/2017 1125   MCH 31.3 03/05/2017 1125   MCH 30.5 01/13/2017 1104   MCHC 34.0 03/05/2017 1125   MCHC 33.3 01/13/2017 1104   RDW 14.4 03/05/2017 1125   LYMPHSABS 3.0 03/05/2017 1125   MONOABS 0.3 07/26/2014 1310   EOSABS 0.2 03/05/2017 1125   BASOSABS 0.0 03/05/2017 1125    BMET    Component Value Date/Time   NA 140 03/20/2017 0257   NA 140 03/05/2017 1125   K 4.0 03/20/2017 0257   CL 100 (L) 03/20/2017 0257   CO2 33 (H) 03/20/2017 0257   GLUCOSE 187 (H) 03/20/2017 0257   BUN 20 03/20/2017 0257   BUN 23 03/05/2017  1125   CREATININE 1.21 (H) 03/20/2017 0257   CREATININE 1.03 01/06/2013 1210   CALCIUM 9.5 03/20/2017 0257   GFRNONAA 43 (L) 03/20/2017 0257   GFRAA 50 (L) 03/20/2017 0257    BNP No results found for: BNP  ProBNP    Component Value Date/Time   PROBNP 932.8 (H) 08/29/2012 0535    Imaging: Ct Chest High Resolution  Result Date: 08/19/2017 CLINICAL DATA:  Increased shortness of breath and productive cough. On oxygen therapy. EXAM: CT CHEST WITHOUT CONTRAST TECHNIQUE: Multidetector CT imaging of the chest was performed following the standard protocol without intravenous contrast. High resolution imaging of the lungs, as well as inspiratory and expiratory imaging, was performed. COMPARISON:  03/12/2017. FINDINGS: Cardiovascular: Atherosclerotic calcification of the arterial vasculature, including coronary arteries. Heart is enlarged. No pericardial effusion. Mediastinum/Nodes: Mediastinal lymph nodes are not enlarged by CT size criteria. Hilar regions are difficult to evaluate without IV contrast. No axillary adenopathy. Esophagus is grossly unremarkable. Lungs/Pleura: Mild centrilobular and moderate paraseptal emphysema. Very mild basilar predominant subpleural reticulation, ground-glass and traction bronchiectasis/bronchiolectasis. Lungs are otherwise clear. No pleural fluid. Airway is unremarkable. Upper Abdomen: Visualized portions of the liver, gallbladder and right adrenal gland are unremarkable. Nodular thickening of the left adrenal gland. Visualized portions of the left kidney, spleen, pancreas, stomach and bowel are grossly unremarkable. Musculoskeletal: Degenerative changes in the spine. No worrisome lytic or sclerotic lesions. IMPRESSION: 1. Mild basilar subpleural fibrosis may be due to non-specific interstitial pneumonitis or early usual interstitial pneumonitis. 2. Aortic atherosclerosis (ICD10-170.0). Coronary artery calcification. 3.  Emphysema (ICD10-J43.9). Electronically Signed    By: Lorin Picket M.D.   On: 08/19/2017 13:48  Assessment & Plan:   Abnormal CT of the chest High resolution CT chest did show some very mild basilar subpleural fibrosis.  Patient does have underlying rheumatoid arthritis, emphysema with previous heavy smoking history.  For now patient is stable on oxygen at 2 L.  She does have some underlying mild to moderate pulmonary hypertension with A. fib status post ablation. Would consider a repeat pulmonary function test in 6-12 months.  And follow CT chest as clinically indicated as she may have some mild ILD developing .    Chronic respiratory failure with hypoxia (HCC) Compensated on oxygen at 2 L.  Patient does have morbid obesity A. fib and mild to moderate pulmonary hypertension on echo.  She says she has had a sleep study previously that was told to be negative.  Those results have been requested.  Might need to consider in lab sleep study to make sure she does not have underlying sleep apnea  Emphysema/COPD ([place]) Emphysema on CT chest  Cont to monitor , no active cough .  Hold on inhalers at this time .       Rexene Edison, NP 08/25/2017

## 2017-08-25 NOTE — Assessment & Plan Note (Signed)
Emphysema on CT chest  Cont to monitor , no active cough .  Hold on inhalers at this time .

## 2017-08-25 NOTE — Assessment & Plan Note (Signed)
High resolution CT chest did show some very mild basilar subpleural fibrosis.  Patient does have underlying rheumatoid arthritis, emphysema with previous heavy smoking history.  For now patient is stable on oxygen at 2 L.  She does have some underlying mild to moderate pulmonary hypertension with A. fib status post ablation. Would consider a repeat pulmonary function test in 6-12 months.  And follow CT chest as clinically indicated as she may have some mild ILD developing .

## 2017-08-25 NOTE — Patient Instructions (Addendum)
Continue on Oxygen 2l/m , goal to keep >90%.  Obtain sleep study results .  Follow up with Dr. Halford Chessman  In 4 months and As needed

## 2017-08-25 NOTE — Assessment & Plan Note (Signed)
Compensated on oxygen at 2 L.  Patient does have morbid obesity A. fib and mild to moderate pulmonary hypertension on echo.  She says she has had a sleep study previously that was told to be negative.  Those results have been requested.  Might need to consider in lab sleep study to make sure she does not have underlying sleep apnea

## 2017-08-26 NOTE — Progress Notes (Signed)
Reviewed and agree with assessment/plan.   Anthonette Lesage, MD [facility] 07/16/2016, 12:24 PM Pager:  [phone]  

## 2017-09-01 ENCOUNTER — Telehealth: Payer: Self-pay | Admitting: Pulmonary Disease

## 2017-09-01 DIAGNOSIS — J439 Emphysema, unspecified: Secondary | ICD-10-CM

## 2017-09-01 NOTE — Telephone Encounter (Signed)
Pt would like an order sent to [facility] for a POC. VS please advise if ok to send order.    Patient Saturations on Room Air at Rest = 95%  Patient Saturations on [employer] while Ambulating = 84%  Patient Saturations on 3-4 Liters of oxygen while Ambulating = 90%  O2 at 2-3 liters con't;  3-4 liters O2 at pulse with exertion  Bradley Ferris, CMA  08-07-17

## 2017-09-02 NOTE — Telephone Encounter (Signed)
Okay to send order for POC. 

## 2017-09-04 NOTE — Telephone Encounter (Signed)
Left voice mail on machine for patient to return phone call back regarding getting a POC with [facility] X1 VS okayed request for POC

## 2017-09-09 NOTE — Telephone Encounter (Signed)
Left voice mail on machine for patient to return phone call back regarding results. X2 

## 2017-09-09 NOTE — Addendum Note (Signed)
Addended by: Georjean Mode on: 09/09/2017 03:11 PM   Modules accepted: Orders

## 2017-09-17 DIAGNOSIS — I10 Essential (primary) hypertension: Secondary | ICD-10-CM | POA: Diagnosis not present

## 2017-09-17 DIAGNOSIS — I5033 Acute on chronic diastolic (congestive) heart failure: Secondary | ICD-10-CM | POA: Diagnosis not present

## 2017-09-17 DIAGNOSIS — I4891 Unspecified atrial fibrillation: Secondary | ICD-10-CM | POA: Diagnosis not present

## 2017-09-23 ENCOUNTER — Encounter (HOSPITAL_COMMUNITY): Payer: Self-pay | Admitting: Nurse Practitioner

## 2017-09-23 ENCOUNTER — Ambulatory Visit (HOSPITAL_COMMUNITY)
Admission: RE | Admit: 2017-09-23 | Discharge: 2017-09-23 | Disposition: A | Discharge status: Home / Self Care | Payer: PPO | Source: Ambulatory Visit | Attending: Nurse Practitioner | Admitting: Nurse Practitioner

## 2017-09-23 VITALS — BP 130/58 | HR 61 | Ht 62.0 in | Wt 216.0 lb

## 2017-09-23 DIAGNOSIS — I4819 Other persistent atrial fibrillation: Secondary | ICD-10-CM

## 2017-09-23 DIAGNOSIS — I4510 Unspecified right bundle-branch block: Secondary | ICD-10-CM | POA: Diagnosis not present

## 2017-09-23 DIAGNOSIS — R9431 Abnormal electrocardiogram [ECG] [EKG]: Secondary | ICD-10-CM | POA: Diagnosis not present

## 2017-09-23 DIAGNOSIS — I481 Persistent atrial fibrillation: Secondary | ICD-10-CM | POA: Diagnosis not present

## 2017-09-23 NOTE — Progress Notes (Signed)
[name], female   DOB: [birth year], 71 y.o.   MRN: [id number]       Date:  09/23/2017   PCP:  Deland Pretty, MD  Cardiologist:  Dr [name] EP: Dr. [name] Primary Electrophysiologist: Roderic Palau, NP    Chief Complaint  Patient presents with  . Atrial Fibrillation     History of Present Illness: Alison Bell is a 72 y.o. female who presented for f/u in the afib clinic, 12/17/16.  She had not been seen since 08/30/15. She is unsure as to why she has not had f/u, she should have been back in spring of 2017. Amiodarone was not listed on her med sheet. She is in rate control afib today.Pharmacy was called pt has not had amiodarone filled x 18 months. She was unaware she was in afib today but husband and pt report that she is tired. Husband is unsure now if another medical provider may have stopped amiodarone. None of her physicians are in [place] so husband will call and find out. Otherwise, they are interested in her reloading on amiodarone to see if she can get back in SR to give her more energy. She had torsades years ago on Tikosyn while loading and it was d/c.  Pt's husband did call and could not find a reason with other providers why amiodarone was stopped so it must have fallen of her drug list by accident. She wanted to get back in rhythm so amio 200 mg bid was started 5/30,. She returned to the office  and continued in aflutter with variable AV block. She was set up for cardioversion July 2nd.  F/u in afib clinic 7/10. She did have successful cardioversion, 7/2, but unfortunately today, EKG shows a flutter at 116 bpm. She does not feel as well with some dizziness, H/A, fatigue. Continues on amiodarone at 200 mg daily. Referred to Dr. [name] for options to restore SR.  F/u in afib clinic from afib ablation. Pt is in SR although slow at 48 bpm. Pt states that at home HR's have been in the low 50's. Has not noted any lightheadedness with this. She had some heart failure around the time  of ablation and was diuresed in the hospital. Her pulse ox also was running lower and she went home on O2. She has not noted any afib or swallowing issues or rt groin issues. She and her husband are planning to go to the beach tomorrow for a few days.  Return  to afib clinic 9/27. She is still wearing O2 by [place] that Dr. [name] d/c pt on. She states that she still has shortness of breath. She has been staying in sinus rhythm at  48 bpm. Weight is stable.  F/u in afib cliniic, 3/6. She is still mourning the death of her husband July 10, 2018. She is staying in [place]. Off amiodarone. Being followed by pulmonary and by last visit, lung status stable. Wears O2 at 2L [place]. No complaints today.  Today, she denies symptoms of palpitations, chest pain,orthopnea, PND, lower extremity edema, claudication, dizziness, presyncope, syncope, bleeding, or neurologic sequela. Positive for fatigue, H/A,lightheadeness. The patient is tolerating medications without difficulties and is otherwise without complaint today.    Past Medical History:  Diagnosis Date  . Anemia   . Arthritis    rheumatoid  . Colon adenoma   . Depression   . Diabetes mellitus without complication ([place])   . Difficulty sleeping   . GERD (gastroesophageal reflux disease)   .  Headache    migraines  . History of blood transfusion   . HTN (hypertension)   . Hyperlipidemia   . LV dysfunction, EF 45-50% due to a. fib per echo 08/27/12  08/28/2012   a. Echo (10/15):  EF 55-60%, no RWMA, Gr 2 DD, mild MR, mod LAE, normal RVF, mild RAE, mild TR (LA 44 mm)  . Persistent atrial fibrillation ([place])   . Torsades de pointes [facility])    due to [facility] 04/2014   Past Surgical History:  Procedure Laterality Date  . ATRIAL FIBRILLATION ABLATION N/A 11/02/2012   PVI by Dr [name]  . ATRIAL FIBRILLATION ABLATION N/A 03/17/2017   Procedure: Atrial Fibrillation Ablation;  Surgeon: [name], MD;  Location: [facility];  Service: Cardiovascular;  Laterality: N/A;    . CARDIOVERSION N/A 09/06/2012   Procedure: CARDIOVERSION;  Surgeon: [name], MD;  Location: [place] [facility];  Service: Cardiovascular;  Laterality: N/A;  . CARDIOVERSION N/A 01/07/2013   Procedure: CARDIOVERSION;  Surgeon: [name], MD;  Location: [facility];  Service: Cardiovascular;  Laterality: N/A;  . CARDIOVERSION N/A 11/14/2013   Procedure: CARDIOVERSION;  Surgeon: [name], MD;  Location: [facility] ENDOSCOPY;  Service: Cardiovascular;  Laterality: N/A;  . CARDIOVERSION N/A 01/19/2017   Procedure: CARDIOVERSION;  Surgeon: [name], MD;  Location: [facility] ENDOSCOPY;  Service: Cardiovascular;  Laterality: N/A;  . COLONOSCOPY N/A 07/28/2014   Procedure: COLONOSCOPY;  Surgeon: Ladene Artist, MD;  Location: [facility] ENDOSCOPY;  Service: Endoscopy;  Laterality: N/A;  . LAPAROSCOPIC SIGMOID COLECTOMY N/A 08/08/2014   Procedure: LAPAROSCOPIC SIGMOID COLECTOMY [facility] PROCTOSCOPY;  Surgeon: Excell Seltzer, MD;  Location: [facility];  Service: General;  Laterality: N/A;  . TEE WITHOUT CARDIOVERSION N/A 09/06/2012   Procedure: TRANSESOPHAGEAL ECHOCARDIOGRAM (TEE);  Surgeon: [name], MD;  Location: [facility] ENDOSCOPY;  Service: Cardiovascular;  Laterality: N/A;  . TEE WITHOUT CARDIOVERSION N/A 11/01/2012   Procedure: TRANSESOPHAGEAL ECHOCARDIOGRAM (TEE);  Surgeon: [name], MD;  Location: [facility] ENDOSCOPY;  Service: Cardiovascular;  Laterality: N/A;  . TUBAL LIGATION     bilateral     Current Outpatient Medications  Medication Sig Dispense Refill  . amitriptyline (ELAVIL) 25 MG tablet Take 25 mg by mouth at bedtime.   3  . atorvastatin (LIPITOR) 10 MG tablet Take 10 mg by mouth at bedtime.     . cetirizine (ZYRTEC) 10 MG tablet Take 10 mg by mouth daily.    . Cholecalciferol (VITAMIN D3) 2000 units TABS Take 2,000 Units by mouth daily.    . cyanocobalamin (CVS VITAMIN B12) 2000 MCG tablet Take 2,000 mcg by mouth daily.    Marland Kitchen ELIQUIS 5 MG TABS tablet TAKE 1 TABLET BY MOUTH TWICE DAILY.  180 tablet 2  . famotidine (PEPCID) 20 MG tablet Take 20 mg by mouth 2 (two) times daily.    . folic acid (FOLVITE) 1 MG tablet Take 1 mg by mouth daily.    . furosemide (LASIX) 40 MG tablet Take 1 tablet (40 mg total) by mouth daily. 30 tablet 6  . gabapentin (NEURONTIN) 300 MG capsule Take 300 mg by mouth 3 (three) times daily as needed (for pain.).     Marland Kitchen HUMIRA PEN 40 MG/0.8ML PNKT Inject 40 mg into the skin every 14 (fourteen) days.     . Insulin Syringe-Needle U-100 (INSULIN SYRINGE 1CC/31GX5/16") 31G X 5/16" 1 ML MISC 1 each by Other route 2 (two) times daily.   0  . Lancets (ACCU-CHEK MULTICLIX) lancets 1 each by  Other route as needed.   11  . LORazepam (ATIVAN) 0.5 MG tablet TK 1 T PO TID PRN  0  . methimazole (TAPAZOLE) 10 MG tablet TK 1 T PO QD WF  3  . Multiple Vitamin (MULTIVITAMIN WITH MINERALS) TABS Take 1 tablet by mouth daily.    Marland Kitchen NOVOLIN 70/30 RELION (70-30) 100 UNIT/ML injection Inject 35-50 Units into the skin 2 (two) times daily with a meal. 60 units in the morning and 35 units in the evening    . Polyethyl Glycol-Propyl Glycol (SYSTANE ULTRA) 0.4-0.3 % SOLN Place 1 drop into both eyes 3 (three) times daily as needed (for dry eyes.).    Marland Kitchen potassium chloride SA (K-DUR,KLOR-CON) 20 MEQ tablet Take 1 tablet (20 mEq total) by mouth daily. 30 tablet 6  . sertraline (ZOLOFT) 100 MG tablet Take 150 mg by mouth every evening.   1  . traZODone (DESYREL) 50 MG tablet Take 50 mg by mouth at bedtime.     No current facility-administered medications for this encounter.     Allergies:   Benicar [olmesartan]; Latex; Other; Tikosyn [dofetilide]; Altace [ramipril]; Maxzide [hydrochlorothiazide w-triamterene]; and Tekturna [aliskiren]   Social History:  The patient  reports that she quit smoking about 23 years ago. she has never used smokeless tobacco. She reports that she does not drink alcohol or use drugs.   Family History:  The patient's  family history includes Breast cancer in her  mother; Diabetes type II in her father; Heart attack in her maternal grandfather, maternal grandmother, mother, and paternal grandmother; Hypertension in her father; Throat cancer in her brother.    ROS:  Please see the history of present illness.   All other systems are reviewed and negative.    PHYSICAL EXAM: VS:  BP (!) 130/58 (BP Location: Left Arm, Patient Position: Sitting, Cuff Size: Large)   Pulse 61   Ht 5\' 2"  (1.575 m)   Wt 216 lb (98 kg)   BMI 39.51 kg/m  , BMI Body mass index is 39.51 kg/m. GEN: Well nourished, well developed, in no acute distress  HEENT: normal  Neck: no JVD, carotid bruits, or masses Cardiac: RRR; no murmurs, rubs, or gallops,no edema  Respiratory:  clear to auscultation bilaterally,few crackles rt lung base GI: soft, nontender, nondistended, + BS MS: no deformity or atrophy  Skin: warm and dry  Neuro:  Strength and sensation are intact Psych: euthymic mood, full affect  EKG: sinus brady at 46 bpm CBC 12/2016, WNL Bmet- Creat 1.14, BUN,-18, K+ 4.0 amd    Lipid Panel     Wt Readings from Last 3 Encounters:  09/23/17 216 lb (98 kg)  08/25/17 216 lb 9.6 oz (98.2 kg)  08/07/17 216 lb (98 kg)      ASSESSMENT AND PLAN:  1.  Atrial fibrillation/flutter S/p ablation Maintaining SR Continue apixaban 5 mg bid for chadsvasc score of at least 6, appropriately dosed  2. HTN Stable  Avoid salt  3. HF Weight is stable  4. Shortness of breath with exertion  Improved  F/u with Dr. [name]  3 months   Geroge Baseman. Shalanda Brogden, [facility] [address] [place], [place] [address] [phone]

## 2017-10-08 NOTE — Addendum Note (Signed)
Encounter addended by: [name], NP on: 10/08/2017 11:28 AM  Actions taken: LOS modified

## 2017-10-18 DIAGNOSIS — I4891 Unspecified atrial fibrillation: Secondary | ICD-10-CM | POA: Diagnosis not present

## 2017-10-18 DIAGNOSIS — I10 Essential (primary) hypertension: Secondary | ICD-10-CM | POA: Diagnosis not present

## 2017-10-18 DIAGNOSIS — I5033 Acute on chronic diastolic (congestive) heart failure: Secondary | ICD-10-CM | POA: Diagnosis not present

## 2017-10-19 DIAGNOSIS — Z79899 Other long term (current) drug therapy: Secondary | ICD-10-CM | POA: Diagnosis not present

## 2017-10-19 DIAGNOSIS — M0579 Rheumatoid arthritis with rheumatoid factor of multiple sites without organ or systems involvement: Secondary | ICD-10-CM | POA: Diagnosis not present

## 2017-10-19 DIAGNOSIS — I4891 Unspecified atrial fibrillation: Secondary | ICD-10-CM | POA: Diagnosis not present

## 2017-10-19 DIAGNOSIS — E0590 Thyrotoxicosis, unspecified without thyrotoxic crisis or storm: Secondary | ICD-10-CM | POA: Diagnosis not present

## 2017-10-19 DIAGNOSIS — N289 Disorder of kidney and ureter, unspecified: Secondary | ICD-10-CM | POA: Diagnosis not present

## 2017-10-19 DIAGNOSIS — M542 Cervicalgia: Secondary | ICD-10-CM | POA: Diagnosis not present

## 2017-11-17 DIAGNOSIS — I4891 Unspecified atrial fibrillation: Secondary | ICD-10-CM | POA: Diagnosis not present

## 2017-11-17 DIAGNOSIS — I5033 Acute on chronic diastolic (congestive) heart failure: Secondary | ICD-10-CM | POA: Diagnosis not present

## 2017-11-17 DIAGNOSIS — I10 Essential (primary) hypertension: Secondary | ICD-10-CM | POA: Diagnosis not present

## 2017-11-24 ENCOUNTER — Ambulatory Visit: Payer: PPO | Admitting: Diagnostic Neuroimaging

## 2017-12-16 DIAGNOSIS — E1165 Type 2 diabetes mellitus with hyperglycemia: Secondary | ICD-10-CM | POA: Diagnosis not present

## 2017-12-16 DIAGNOSIS — I10 Essential (primary) hypertension: Secondary | ICD-10-CM | POA: Diagnosis not present

## 2017-12-16 DIAGNOSIS — E0590 Thyrotoxicosis, unspecified without thyrotoxic crisis or storm: Secondary | ICD-10-CM | POA: Diagnosis not present

## 2017-12-16 DIAGNOSIS — E1122 Type 2 diabetes mellitus with diabetic chronic kidney disease: Secondary | ICD-10-CM | POA: Diagnosis not present

## 2017-12-18 DIAGNOSIS — I4891 Unspecified atrial fibrillation: Secondary | ICD-10-CM | POA: Diagnosis not present

## 2017-12-18 DIAGNOSIS — I5033 Acute on chronic diastolic (congestive) heart failure: Secondary | ICD-10-CM | POA: Diagnosis not present

## 2017-12-18 DIAGNOSIS — I10 Essential (primary) hypertension: Secondary | ICD-10-CM | POA: Diagnosis not present

## 2018-01-17 DIAGNOSIS — I4891 Unspecified atrial fibrillation: Secondary | ICD-10-CM | POA: Diagnosis not present

## 2018-01-17 DIAGNOSIS — I10 Essential (primary) hypertension: Secondary | ICD-10-CM | POA: Diagnosis not present

## 2018-01-17 DIAGNOSIS — I5033 Acute on chronic diastolic (congestive) heart failure: Secondary | ICD-10-CM | POA: Diagnosis not present

## 2018-02-10 DIAGNOSIS — E1165 Type 2 diabetes mellitus with hyperglycemia: Secondary | ICD-10-CM | POA: Diagnosis not present

## 2018-02-10 DIAGNOSIS — Z794 Long term (current) use of insulin: Secondary | ICD-10-CM | POA: Diagnosis not present

## 2018-02-17 DIAGNOSIS — I5033 Acute on chronic diastolic (congestive) heart failure: Secondary | ICD-10-CM | POA: Diagnosis not present

## 2018-02-17 DIAGNOSIS — I4891 Unspecified atrial fibrillation: Secondary | ICD-10-CM | POA: Diagnosis not present

## 2018-02-17 DIAGNOSIS — I10 Essential (primary) hypertension: Secondary | ICD-10-CM | POA: Diagnosis not present

## 2018-02-18 DIAGNOSIS — N390 Urinary tract infection, site not specified: Secondary | ICD-10-CM | POA: Diagnosis not present

## 2018-02-18 DIAGNOSIS — E7800 Pure hypercholesterolemia, unspecified: Secondary | ICD-10-CM | POA: Diagnosis not present

## 2018-02-18 DIAGNOSIS — I10 Essential (primary) hypertension: Secondary | ICD-10-CM | POA: Diagnosis not present

## 2018-02-19 ENCOUNTER — Encounter: Payer: Self-pay | Admitting: Pulmonary Disease

## 2018-02-19 ENCOUNTER — Ambulatory Visit: Payer: PPO | Admitting: Pulmonary Disease

## 2018-02-19 VITALS — BP 130/74 | HR 87 | Ht 62.0 in | Wt 205.0 lb

## 2018-02-19 DIAGNOSIS — J9611 Chronic respiratory failure with hypoxia: Secondary | ICD-10-CM

## 2018-02-19 DIAGNOSIS — J439 Emphysema, unspecified: Secondary | ICD-10-CM | POA: Diagnosis not present

## 2018-02-19 DIAGNOSIS — J849 Interstitial pulmonary disease, unspecified: Secondary | ICD-10-CM

## 2018-02-19 NOTE — Patient Instructions (Signed)
Follow up in 6 months 

## 2018-02-19 NOTE — Progress Notes (Signed)
[facility]  Chief Complaint  Patient presents with  . Follow-up    ROV    Constitutional: BP 130/74 (BP Location: Left Arm, Cuff Size: Normal)   Pulse 87   Ht 5\' 2"  (1.575 m)   Wt 205 lb (93 kg)   SpO2 98%   BMI 37.49 kg/m   History of Present Illness: Alison Bell is a 72 y.o. female former smoker with emphysema, pulmonary fibrosis, pulmonary hypertension and rheumatoid arthritis with chronic respiratory failure on 2 liters oxygen.  She feels her breathing has been okay.  Not having cough, wheeze, sputum, chest pain, fever, or hemoptysis.  Get post nasal drip at night.  She has oxygen at home.  She monitors her pulse ox level.  She uses oxygen when she does activity that would cause SpO2 to go below 90%.  She is followed by rheumatology and is on humira.  Hasn't needed prednisone recently.  Joint symptoms are under control.  Comprehensive Respiratory Exam:  Appearance - well kempt  ENMT - nasal mucosa moist, turbinates clear, midline nasal septum, no dental lesions, no gingival bleeding, no oral exudates, no tonsillar hypertrophy Neck - no masses, trachea midline, no thyromegaly, no elevation in JVP Respiratory - normal appearance of chest wall, normal respiratory effort w/o accessory muscle use, no dullness on percussion, no wheezing or rales CV - s1s2 regular rate and rhythm, no murmurs, no peripheral edema, radial pulses symmetric GI - soft, non tender, no masses Lymph - no adenopathy noted in neck and axillary areas MSK - normal muscle strength and tone, normal gait Ext - no cyanosis, clubbing, or joint inflammation noted Skin - no rashes, lesions, or ulcers Neuro - oriented to person, place, and time Psych - normal mood and affect   Assessment/Plan:  Emphysema. - no significant respiratory symptoms - monitor off inhalers  Mild ILD in setting of RA. - followed by Dr. Sabino Bell with rheumatology  Chronic respiratory  failure with hypoxia. - explained goal SpO2 is > 90% - 2 liters oxygen as needed with exertion   Patient Instructions  Follow up in 6 months   Chesley Mires, MD [place] 02/19/2018, 10:27 AM Pager:  [phone]  Flow Sheet  Pulmonary tests: PFT 06/05/15 >> FEV1 1.80 (88%), FEV1% 82, TLC4.21 (88%), DLCO 60% CT chest 03/13/17 >> GGO RML  PFT 04/23/17 >> FEV1 1.51 (76%), FEV1% 87, TLC 3.83 (80%), DLCO 39% HRCT chest 08/19/17 >> mild centrilobular emphysema, moderate paraseptal emphysema, mild subpleural reticulation/GGO/traction BTX  Cardiac tests: Echo 02/06/17 >> EF 60 to 65%, PAS 44 mmHg  Past Medical History: She  has a past medical history of Anemia, Arthritis, Colon adenoma, Depression, Diabetes mellitus without complication ([place]), Difficulty sleeping, GERD (gastroesophageal reflux disease), Headache, History of blood transfusion, HTN (hypertension), Hyperlipidemia, LV dysfunction, EF 45-50% due to a. fib per echo 08/27/12  (08/28/2012), Persistent atrial fibrillation ([place]), and Torsades de pointes ([place]).  Past Surgical History: She  has a past surgical history that includes Tubal ligation; TEE without cardioversion (N/A, 09/06/2012); Cardioversion (N/A, 09/06/2012); TEE without cardioversion (N/A, 11/01/2012); Cardioversion (N/A, 01/07/2013); Cardioversion (N/A, 11/14/2013); atrial fibrillation ablation (N/A, 11/02/2012); Colonoscopy (N/A, 07/28/2014); Laparoscopic sigmoid colectomy (N/A, 08/08/2014); Cardioversion (N/A, 01/19/2017); and ATRIAL FIBRILLATION ABLATION (N/A, 03/17/2017).  Family History: Her family history includes Breast cancer in her mother; Diabetes type II in her father; Heart attack in her maternal grandfather, maternal grandmother, mother, and paternal grandmother; Hypertension in her father; Throat cancer in her brother.  Social History: She  reports that she quit smoking about 23 years ago. She has never used smokeless tobacco. She reports that she does  not drink alcohol or use drugs.  Medications: Allergies as of 02/19/2018      Reactions   Benicar [olmesartan] Anaphylaxis   Latex Hives   Other Itching, Swelling   White meat chicken causes hands and feet to itch and swell.   Tikosyn [dofetilide] Other (See Comments)   Torsades after 1 dose of Tikosyn without significant QT prolongation   Altace [ramipril]    unknown   Maxzide [hydrochlorothiazide W-triamterene]    unknown   Tekturna [aliskiren]    unknown      Medication List        Accurate as of 02/19/18 10:27 AM. Always use your most recent med list.          accu-chek multiclix lancets 1 each by Other route as needed.   amitriptyline 25 MG tablet Commonly known as:  ELAVIL Take 25 mg by mouth at bedtime.   atorvastatin 10 MG tablet Commonly known as:  LIPITOR Take 10 mg by mouth at bedtime.   cetirizine 10 MG tablet Commonly known as:  ZYRTEC Take 10 mg by mouth daily.   CVS VITAMIN B12 2000 MCG tablet Generic drug:  cyanocobalamin Take 2,000 mcg by mouth daily.   ELIQUIS 5 MG Tabs tablet Generic drug:  apixaban TAKE 1 TABLET BY MOUTH TWICE DAILY.   famotidine 20 MG tablet Commonly known as:  PEPCID Take 20 mg by mouth 2 (two) times daily.   folic acid 1 MG tablet Commonly known as:  FOLVITE Take 1 mg by mouth daily.   furosemide 40 MG tablet Commonly known as:  LASIX Take 1 tablet (40 mg total) by mouth daily.   gabapentin 300 MG capsule Commonly known as:  NEURONTIN Take 300 mg by mouth 3 (three) times daily as needed (for pain.).   HUMIRA PEN 40 MG/0.8ML Pnkt Generic drug:  Adalimumab Inject 40 mg into the skin every 14 (fourteen) days.   INSULIN SYRINGE 1CC/31GX5/16" 31G X 5/16" 1 ML Misc 1 each by Other route 2 (two) times daily.   LORazepam 0.5 MG tablet Commonly known as:  ATIVAN TK 1 T PO TID PRN   methimazole 10 MG tablet Commonly known as:  TAPAZOLE TK 1 T PO QD WF   multivitamin with minerals Tabs tablet Take 1 tablet by  mouth daily.   NOVOLIN 70/30 RELION (70-30) 100 UNIT/ML injection Generic drug:  insulin NPH-regular Human Inject 35-50 Units into the skin 2 (two) times daily with a meal. 60 units in the morning and 35 units in the evening   potassium chloride SA 20 MEQ tablet Commonly known as:  K-DUR,KLOR-CON Take 1 tablet (20 mEq total) by mouth daily.   sertraline 100 MG tablet Commonly known as:  ZOLOFT Take 150 mg by mouth every evening.   SYSTANE ULTRA 0.4-0.3 % Soln Generic drug:  Polyethyl Glycol-Propyl Glycol Place 1 drop into both eyes 3 (three) times daily as needed (for dry eyes.).   traZODone 50 MG tablet Commonly known as:  DESYREL Take 50 mg by mouth at bedtime.   Vitamin D3 2000 units Tabs Take 2,000 Units by mouth daily.

## 2018-02-23 DIAGNOSIS — F331 Major depressive disorder, recurrent, moderate: Secondary | ICD-10-CM | POA: Diagnosis not present

## 2018-02-23 DIAGNOSIS — Z6838 Body mass index (BMI) 38.0-38.9, adult: Secondary | ICD-10-CM | POA: Diagnosis not present

## 2018-02-23 DIAGNOSIS — M0579 Rheumatoid arthritis with rheumatoid factor of multiple sites without organ or systems involvement: Secondary | ICD-10-CM | POA: Diagnosis not present

## 2018-02-23 DIAGNOSIS — I10 Essential (primary) hypertension: Secondary | ICD-10-CM | POA: Diagnosis not present

## 2018-02-23 DIAGNOSIS — K219 Gastro-esophageal reflux disease without esophagitis: Secondary | ICD-10-CM | POA: Diagnosis not present

## 2018-02-23 DIAGNOSIS — N183 Chronic kidney disease, stage 3 (moderate): Secondary | ICD-10-CM | POA: Diagnosis not present

## 2018-02-23 DIAGNOSIS — E1122 Type 2 diabetes mellitus with diabetic chronic kidney disease: Secondary | ICD-10-CM | POA: Diagnosis not present

## 2018-02-23 DIAGNOSIS — E7800 Pure hypercholesterolemia, unspecified: Secondary | ICD-10-CM | POA: Diagnosis not present

## 2018-02-23 DIAGNOSIS — Z0001 Encounter for general adult medical examination with abnormal findings: Secondary | ICD-10-CM | POA: Diagnosis not present

## 2018-02-23 DIAGNOSIS — I4891 Unspecified atrial fibrillation: Secondary | ICD-10-CM | POA: Diagnosis not present

## 2018-02-23 DIAGNOSIS — I5030 Unspecified diastolic (congestive) heart failure: Secondary | ICD-10-CM | POA: Diagnosis not present

## 2018-03-09 DIAGNOSIS — Z6838 Body mass index (BMI) 38.0-38.9, adult: Secondary | ICD-10-CM | POA: Diagnosis not present

## 2018-03-09 DIAGNOSIS — F5104 Psychophysiologic insomnia: Secondary | ICD-10-CM | POA: Diagnosis not present

## 2018-03-09 DIAGNOSIS — Z8659 Personal history of other mental and behavioral disorders: Secondary | ICD-10-CM | POA: Diagnosis not present

## 2018-03-15 ENCOUNTER — Other Ambulatory Visit: Payer: Self-pay | Admitting: Internal Medicine

## 2018-03-15 DIAGNOSIS — E1165 Type 2 diabetes mellitus with hyperglycemia: Secondary | ICD-10-CM | POA: Diagnosis not present

## 2018-03-15 DIAGNOSIS — Z794 Long term (current) use of insulin: Secondary | ICD-10-CM | POA: Diagnosis not present

## 2018-03-15 NOTE — Telephone Encounter (Signed)
Eliquis 5mg  refill request received; pt is 72 yrs old, wt-98kg, Crea-1.27 via [employer] on 02/18/18, last seen by Roderic Palau on 09/23/17; will send in refill to requested pharmacy.

## 2018-03-20 DIAGNOSIS — I5033 Acute on chronic diastolic (congestive) heart failure: Secondary | ICD-10-CM | POA: Diagnosis not present

## 2018-03-20 DIAGNOSIS — I4891 Unspecified atrial fibrillation: Secondary | ICD-10-CM | POA: Diagnosis not present

## 2018-03-20 DIAGNOSIS — I10 Essential (primary) hypertension: Secondary | ICD-10-CM | POA: Diagnosis not present

## 2018-03-24 ENCOUNTER — Ambulatory Visit: Payer: PPO | Admitting: Internal Medicine

## 2018-03-24 ENCOUNTER — Telehealth: Payer: Self-pay

## 2018-03-24 ENCOUNTER — Encounter: Payer: Self-pay | Admitting: Internal Medicine

## 2018-03-24 VITALS — BP 122/68 | HR 76 | Ht 62.0 in | Wt 205.2 lb

## 2018-03-24 DIAGNOSIS — I4819 Other persistent atrial fibrillation: Secondary | ICD-10-CM

## 2018-03-24 DIAGNOSIS — I481 Persistent atrial fibrillation: Secondary | ICD-10-CM | POA: Diagnosis not present

## 2018-03-24 DIAGNOSIS — I119 Hypertensive heart disease without heart failure: Secondary | ICD-10-CM | POA: Diagnosis not present

## 2018-03-24 NOTE — Patient Instructions (Signed)
Medication Instructions:  Your physician recommends that you continue on your current medications as directed. Please refer to the Current Medication list given to you today.  Labwork: None ordered.  Testing/Procedures: None ordered.  Follow-Up:  Your physician wants you to follow-up in: 6 months with Dr [name]. You will receive a reminder letter in the mail two months in advance. If you don't receive a letter, please call our office to schedule the follow-up appointment.  Your physician recommends that you schedule a follow-up appointment in: 3 months with Roderic Palau, NP in the Afib clinic.    Any Other Special Instructions Will Be Listed Below (If Applicable).     If you need a refill on your cardiac medications before your next appointment, please call your pharmacy.

## 2018-03-24 NOTE — Progress Notes (Signed)
PCP: Deland Pretty, MD Primary Cardiologist: Dr [name] Primary EP: Dr Tinnie Gens Brakebill is a 72 y.o. female who presents today for routine electrophysiology followup.  Since last being seen in our clinic, the patient reports doing very well.  No afib.  SOB is improved.  Today, she denies symptoms of palpitations, chest pain, lower extremity edema, dizziness, presyncope, or syncope.  The patient is otherwise without complaint today.   Past Medical History:  Diagnosis Date  . Anemia   . Arthritis    rheumatoid  . Colon adenoma   . Depression   . Diabetes mellitus without complication ([place])   . Difficulty sleeping   . GERD (gastroesophageal reflux disease)   . Headache    migraines  . History of blood transfusion   . HTN (hypertension)   . Hyperlipidemia   . LV dysfunction, EF 45-50% due to a. fib per echo 08/27/12  08/28/2012   a. Echo (10/15):  EF 55-60%, no RWMA, Gr 2 DD, mild MR, mod LAE, normal RVF, mild RAE, mild TR (LA 44 mm)  . Persistent atrial fibrillation ([place])   . Torsades de pointes [facility])    due to [facility] 04/2014   Past Surgical History:  Procedure Laterality Date  . ATRIAL FIBRILLATION ABLATION N/A 11/02/2012   PVI by Dr [name]  . ATRIAL FIBRILLATION ABLATION N/A 03/17/2017   Procedure: Atrial Fibrillation Ablation;  Surgeon: [name], MD;  Location: [place] CV LAB;  Service: Cardiovascular;  Laterality: N/A;  . CARDIOVERSION N/A 09/06/2012   Procedure: CARDIOVERSION;  Surgeon: [name], MD;  Location: [place];  Service: Cardiovascular;  Laterality: N/A;  . CARDIOVERSION N/A 01/07/2013   Procedure: CARDIOVERSION;  Surgeon: [name], MD;  Location: [place] ENDOSCOPY;  Service: Cardiovascular;  Laterality: N/A;  . CARDIOVERSION N/A 11/14/2013   Procedure: CARDIOVERSION;  Surgeon: [name], MD;  Location: [facility] ENDOSCOPY;  Service: Cardiovascular;  Laterality: N/A;  . CARDIOVERSION N/A 01/19/2017   Procedure: CARDIOVERSION;  Surgeon: [name], MD;  Location: [facility] ENDOSCOPY;  Service: Cardiovascular;  Laterality: N/A;  . COLONOSCOPY N/A 07/28/2014   Procedure: COLONOSCOPY;  Surgeon: Ladene Artist, MD;  Location: [facility] ENDOSCOPY;  Service: Endoscopy;  Laterality: N/A;  . LAPAROSCOPIC SIGMOID COLECTOMY N/A 08/08/2014   Procedure: LAPAROSCOPIC SIGMOID COLECTOMY Sylvie Farrier PROCTOSCOPY;  Surgeon: Excell Seltzer, MD;  Location: [facility];  Service: General;  Laterality: N/A;  . TEE WITHOUT CARDIOVERSION N/A 09/06/2012   Procedure: TRANSESOPHAGEAL ECHOCARDIOGRAM (TEE);  Surgeon: [name], MD;  Location: [facility] ENDOSCOPY;  Service: Cardiovascular;  Laterality: N/A;  . TEE WITHOUT CARDIOVERSION N/A 11/01/2012   Procedure: TRANSESOPHAGEAL ECHOCARDIOGRAM (TEE);  Surgeon: [name], MD;  Location: [facility] ENDOSCOPY;  Service: Cardiovascular;  Laterality: N/A;  . TUBAL LIGATION     bilateral    ROS- all systems are reviewed and negatives except as per HPI above  Current Outpatient Medications  Medication Sig Dispense Refill  . amitriptyline (ELAVIL) 25 MG tablet Take 50 mg by mouth at bedtime.   3  . atorvastatin (LIPITOR) 10 MG tablet Take 10 mg by mouth at bedtime.     . cetirizine (ZYRTEC) 10 MG tablet Take 10 mg by mouth daily.    . Cholecalciferol (VITAMIN D3) 2000 units TABS Take 2,000 Units by mouth daily.    . cyanocobalamin (CVS VITAMIN B12) 2000 MCG tablet Take 2,000 mcg by mouth daily.    Marland Kitchen ELIQUIS 5 MG TABS tablet TAKE 1 TABLET BY MOUTH TWICE DAILY.  180 tablet 2  . famotidine (PEPCID) 20 MG tablet Take 20 mg by mouth 2 (two) times daily.    . folic acid (FOLVITE) 1 MG tablet Take 1 mg by mouth daily.    Marland Kitchen gabapentin (NEURONTIN) 300 MG capsule Take 300 mg by mouth 3 (three) times daily as needed (for pain.).     Marland Kitchen HUMIRA PEN 40 MG/0.8ML PNKT Inject 40 mg into the skin every 14 (fourteen) days.     . Insulin Syringe-Needle U-100 (INSULIN SYRINGE 1CC/31GX5/16") 31G X 5/16" 1 ML MISC 1 each by Other route 3 (three) times daily.   0   . Lancets (ACCU-CHEK MULTICLIX) lancets 1 each by Other route as needed.   11  . LORazepam (ATIVAN) 0.5 MG tablet TK 1 T PO TID PRN  0  . methimazole (TAPAZOLE) 10 MG tablet TK 1 T PO QD WF  3  . Multiple Vitamin (MULTIVITAMIN WITH MINERALS) TABS Take 1 tablet by mouth daily.    Marland Kitchen NOVOLIN 70/30 RELION (70-30) 100 UNIT/ML injection Inject 35-50 Units into the skin 2 (two) times daily with a meal. 60 units in the morning and 35 units in the evening    . potassium chloride SA (K-DUR,KLOR-CON) 20 MEQ tablet Take 1 tablet (20 mEq total) by mouth daily. 30 tablet 6  . sertraline (ZOLOFT) 100 MG tablet Take 150 mg by mouth every evening.   1  . traZODone (DESYREL) 50 MG tablet Take 100 mg by mouth at bedtime.     . furosemide (LASIX) 40 MG tablet Take 1 tablet (40 mg total) by mouth daily. 30 tablet 6   No current facility-administered medications for this visit.     Physical Exam: Vitals:   03/24/18 0944  BP: 122/68  Pulse: 76  SpO2: 96%  Weight: 205 lb 3.2 oz (93.1 kg)  Height: 5\' 2"  (1.575 m)    GEN- The patient is well appearing, alert and oriented x 3 today.   Head- normocephalic, atraumatic Eyes-  Sclera clear, conjunctiva pink Ears- hearing intact Oropharynx- clear Lungs- Clear to ausculation bilaterally, normal work of breathing Heart- Regular rate and rhythm, no murmurs, rubs or gallops, PMI not laterally displaced GI- soft, NT, ND, + BS Extremities- no clubbing, cyanosis, or edema  Wt Readings from Last 3 Encounters:  03/24/18 205 lb 3.2 oz (93.1 kg)  02/19/18 205 lb (93 kg)  09/23/17 216 lb (98 kg)    EKG tracing ordered today is personally reviewed and shows sinus rhythm 76 bpm, PR 196 msec, RBBB with QRS 124 msec, Qtc 468 msec, nonspecific St/T changes  Assessment and Plan:  1. Persistent afib Doing very well post ablation off AAD therapy chads2vasc score is 4.  Continue anticoagulation with eliquis  2 obesity Body mass index is 37.53 kg/m. Wt Readings from  Last 3 Encounters:  03/24/18 205 lb 3.2 oz (93.1 kg)  02/19/18 205 lb (93 kg)  09/23/17 216 lb (98 kg)   She has made some progress!  I am pleased with this. Lifestyle modification again encouraged I have given a flyer for the [facility] [employer] AF nursing exercise program today.  She seems very interested.  3. Hypertensive cardiovascular disease Stable No change required today  Follow-up in AF clinic in 3 months I will see in 6 months  [name] MD, [facility] 03/24/2018 9:50 AM

## 2018-03-24 NOTE — Telephone Encounter (Signed)
Spoke to Alison Bell about the PREP at [facility]  She is interested in participating and will be coming in on 03/29/18 at 11am to register.

## 2018-04-05 ENCOUNTER — Telehealth (HOSPITAL_COMMUNITY): Payer: Self-pay | Admitting: *Deleted

## 2018-04-05 ENCOUNTER — Telehealth: Payer: Self-pay

## 2018-04-05 MED ORDER — DILTIAZEM HCL ER COATED BEADS 120 MG PO CP24: 120.0000 mg | ORAL_CAPSULE | Freq: Every day | ORAL | 3 refills | Status: DC

## 2018-04-05 NOTE — Telephone Encounter (Signed)
Alison Bell has called to say she can't come to her intake appointment this morning at 11am d/t her heart rate being in the 150's.  Suggested for her to call her cardiologist, which she said is going to call.  I asked if she was having any symptoms and she said she was a little shob.  Also asked if she had a ride or needed to call an ambulance.  She said she will get a ride w/her neighbor.  Asked her to touch base when things settle down.

## 2018-04-05 NOTE — Telephone Encounter (Signed)
Patient called in stating she returned to AF this morning. HR in the 160s oxygen sat in high 90s she does not have a way of checking her BP. She was actually getting ready to go to the [facility] for the afib PREP class. Discussed with Roderic Palau NP will restart her cardizem 120mg  once a day today - she will call if she has low heart rates or does not convert back into NSR. Told pt she probably needs to hold off on exercising today if still in AF.

## 2018-04-16 DIAGNOSIS — E0590 Thyrotoxicosis, unspecified without thyrotoxic crisis or storm: Secondary | ICD-10-CM | POA: Diagnosis not present

## 2018-04-16 DIAGNOSIS — Z794 Long term (current) use of insulin: Secondary | ICD-10-CM | POA: Diagnosis not present

## 2018-04-16 DIAGNOSIS — E7800 Pure hypercholesterolemia, unspecified: Secondary | ICD-10-CM | POA: Diagnosis not present

## 2018-04-16 DIAGNOSIS — E1165 Type 2 diabetes mellitus with hyperglycemia: Secondary | ICD-10-CM | POA: Diagnosis not present

## 2018-04-16 DIAGNOSIS — Z23 Encounter for immunization: Secondary | ICD-10-CM | POA: Diagnosis not present

## 2018-04-16 DIAGNOSIS — I10 Essential (primary) hypertension: Secondary | ICD-10-CM | POA: Diagnosis not present

## 2018-04-16 DIAGNOSIS — E1122 Type 2 diabetes mellitus with diabetic chronic kidney disease: Secondary | ICD-10-CM | POA: Diagnosis not present

## 2018-04-19 DIAGNOSIS — I5033 Acute on chronic diastolic (congestive) heart failure: Secondary | ICD-10-CM | POA: Diagnosis not present

## 2018-04-19 DIAGNOSIS — I10 Essential (primary) hypertension: Secondary | ICD-10-CM | POA: Diagnosis not present

## 2018-04-19 DIAGNOSIS — I4891 Unspecified atrial fibrillation: Secondary | ICD-10-CM | POA: Diagnosis not present

## 2018-05-05 ENCOUNTER — Encounter: Payer: Self-pay | Admitting: Internal Medicine

## 2018-05-16 DIAGNOSIS — Z794 Long term (current) use of insulin: Secondary | ICD-10-CM | POA: Diagnosis not present

## 2018-05-16 DIAGNOSIS — E1165 Type 2 diabetes mellitus with hyperglycemia: Secondary | ICD-10-CM | POA: Diagnosis not present

## 2018-05-20 DIAGNOSIS — I4891 Unspecified atrial fibrillation: Secondary | ICD-10-CM | POA: Diagnosis not present

## 2018-05-20 DIAGNOSIS — I10 Essential (primary) hypertension: Secondary | ICD-10-CM | POA: Diagnosis not present

## 2018-05-20 DIAGNOSIS — I5033 Acute on chronic diastolic (congestive) heart failure: Secondary | ICD-10-CM | POA: Diagnosis not present

## 2018-05-25 DIAGNOSIS — Z79899 Other long term (current) drug therapy: Secondary | ICD-10-CM | POA: Diagnosis not present

## 2018-05-25 DIAGNOSIS — M542 Cervicalgia: Secondary | ICD-10-CM | POA: Diagnosis not present

## 2018-05-25 DIAGNOSIS — M0579 Rheumatoid arthritis with rheumatoid factor of multiple sites without organ or systems involvement: Secondary | ICD-10-CM | POA: Diagnosis not present

## 2018-05-25 DIAGNOSIS — M25561 Pain in right knee: Secondary | ICD-10-CM | POA: Diagnosis not present

## 2018-05-25 DIAGNOSIS — M545 Low back pain: Secondary | ICD-10-CM | POA: Diagnosis not present

## 2018-05-25 DIAGNOSIS — M19072 Primary osteoarthritis, left ankle and foot: Secondary | ICD-10-CM | POA: Diagnosis not present

## 2018-05-25 DIAGNOSIS — I4891 Unspecified atrial fibrillation: Secondary | ICD-10-CM | POA: Diagnosis not present

## 2018-05-25 DIAGNOSIS — M25562 Pain in left knee: Secondary | ICD-10-CM | POA: Diagnosis not present

## 2018-05-25 DIAGNOSIS — M549 Dorsalgia, unspecified: Secondary | ICD-10-CM | POA: Diagnosis not present

## 2018-05-25 DIAGNOSIS — E669 Obesity, unspecified: Secondary | ICD-10-CM | POA: Diagnosis not present

## 2018-05-25 DIAGNOSIS — M79671 Pain in right foot: Secondary | ICD-10-CM | POA: Diagnosis not present

## 2018-05-25 DIAGNOSIS — M1990 Unspecified osteoarthritis, unspecified site: Secondary | ICD-10-CM | POA: Diagnosis not present

## 2018-05-25 DIAGNOSIS — M79672 Pain in left foot: Secondary | ICD-10-CM | POA: Diagnosis not present

## 2018-05-25 DIAGNOSIS — M19071 Primary osteoarthritis, right ankle and foot: Secondary | ICD-10-CM | POA: Diagnosis not present

## 2018-05-25 DIAGNOSIS — N289 Disorder of kidney and ureter, unspecified: Secondary | ICD-10-CM | POA: Diagnosis not present

## 2018-06-16 DIAGNOSIS — E1165 Type 2 diabetes mellitus with hyperglycemia: Secondary | ICD-10-CM | POA: Diagnosis not present

## 2018-06-16 DIAGNOSIS — Z794 Long term (current) use of insulin: Secondary | ICD-10-CM | POA: Diagnosis not present

## 2018-06-19 DIAGNOSIS — I4891 Unspecified atrial fibrillation: Secondary | ICD-10-CM | POA: Diagnosis not present

## 2018-06-19 DIAGNOSIS — I10 Essential (primary) hypertension: Secondary | ICD-10-CM | POA: Diagnosis not present

## 2018-06-19 DIAGNOSIS — I5033 Acute on chronic diastolic (congestive) heart failure: Secondary | ICD-10-CM | POA: Diagnosis not present

## 2018-06-23 ENCOUNTER — Ambulatory Visit (HOSPITAL_COMMUNITY): Payer: PPO | Admitting: Nurse Practitioner

## 2018-06-26 ENCOUNTER — Other Ambulatory Visit (HOSPITAL_COMMUNITY): Payer: Self-pay | Admitting: Nurse Practitioner

## 2018-07-05 ENCOUNTER — Ambulatory Visit (HOSPITAL_COMMUNITY)
Admission: RE | Admit: 2018-07-05 | Discharge: 2018-07-05 | Disposition: A | Discharge status: Home / Self Care | Payer: PPO | Source: Ambulatory Visit | Attending: Nurse Practitioner | Admitting: Nurse Practitioner

## 2018-07-05 ENCOUNTER — Encounter (HOSPITAL_COMMUNITY): Payer: Self-pay | Admitting: Nurse Practitioner

## 2018-07-05 VITALS — BP 130/62 | HR 71 | Ht 62.0 in | Wt 201.0 lb

## 2018-07-05 DIAGNOSIS — M069 Rheumatoid arthritis, unspecified: Secondary | ICD-10-CM | POA: Diagnosis not present

## 2018-07-05 DIAGNOSIS — I4819 Other persistent atrial fibrillation: Secondary | ICD-10-CM | POA: Diagnosis not present

## 2018-07-05 DIAGNOSIS — Z7901 Long term (current) use of anticoagulants: Secondary | ICD-10-CM | POA: Insufficient documentation

## 2018-07-05 DIAGNOSIS — K219 Gastro-esophageal reflux disease without esophagitis: Secondary | ICD-10-CM | POA: Insufficient documentation

## 2018-07-05 DIAGNOSIS — G43909 Migraine, unspecified, not intractable, without status migrainosus: Secondary | ICD-10-CM | POA: Insufficient documentation

## 2018-07-05 DIAGNOSIS — Z87891 Personal history of nicotine dependence: Secondary | ICD-10-CM | POA: Diagnosis not present

## 2018-07-05 DIAGNOSIS — Z794 Long term (current) use of insulin: Secondary | ICD-10-CM | POA: Diagnosis not present

## 2018-07-05 DIAGNOSIS — I119 Hypertensive heart disease without heart failure: Secondary | ICD-10-CM

## 2018-07-05 DIAGNOSIS — F329 Major depressive disorder, single episode, unspecified: Secondary | ICD-10-CM | POA: Diagnosis not present

## 2018-07-05 DIAGNOSIS — E785 Hyperlipidemia, unspecified: Secondary | ICD-10-CM | POA: Insufficient documentation

## 2018-07-05 DIAGNOSIS — I10 Essential (primary) hypertension: Secondary | ICD-10-CM | POA: Insufficient documentation

## 2018-07-05 DIAGNOSIS — Z6836 Body mass index (BMI) 36.0-36.9, adult: Secondary | ICD-10-CM | POA: Insufficient documentation

## 2018-07-05 DIAGNOSIS — I4510 Unspecified right bundle-branch block: Secondary | ICD-10-CM | POA: Diagnosis not present

## 2018-07-05 DIAGNOSIS — E119 Type 2 diabetes mellitus without complications: Secondary | ICD-10-CM | POA: Insufficient documentation

## 2018-07-05 DIAGNOSIS — Z79899 Other long term (current) drug therapy: Secondary | ICD-10-CM | POA: Diagnosis not present

## 2018-07-05 DIAGNOSIS — Z8249 Family history of ischemic heart disease and other diseases of the circulatory system: Secondary | ICD-10-CM | POA: Diagnosis not present

## 2018-07-05 NOTE — Progress Notes (Signed)
Primary Care Physician: Deland Pretty, MD Primary Cardiologist: [name] Primary Electrophysiologist: Alison Bell is a 72 y.o. female with a history of persistent atrial fibrillation who presents for follow up in the [facility].  Since last being seen in clinic, the patient reports doing very well from an AF standpoint. She is struggling with the holidays this year with the death of her husband last Jul 12, 2023.  Today, she denies symptoms of palpitations, chest pain, shortness of breath (above baseline), orthopnea, PND, lower extremity edema, dizziness, presyncope, syncope, snoring, daytime somnolence, bleeding, or neurologic sequela. The patient is tolerating medications without difficulties and is otherwise without complaint today.   She is ambulating more and trying to take better care of herself. She has not started the exercise class at the [facility].  She continues to work on weight loss.    Past Medical History:  Diagnosis Date  . Anemia   . Arthritis    rheumatoid  . Colon adenoma   . Depression   . Diabetes mellitus without complication ([place])   . Difficulty sleeping   . GERD (gastroesophageal reflux disease)   . Headache    migraines  . History of blood transfusion   . HTN (hypertension)   . Hyperlipidemia   . LV dysfunction, EF 45-50% due to a. fib per echo 08/27/12  08/28/2012   a. Echo (10/15):  EF 55-60%, no RWMA, Gr 2 DD, mild MR, mod LAE, normal RVF, mild RAE, mild TR (LA 44 mm)  . Persistent atrial fibrillation   . Torsades de pointes [facility])    due to [facility] 04/2014   Past Surgical History:  Procedure Laterality Date  . ATRIAL FIBRILLATION ABLATION N/A 11/02/2012   PVI by Dr [name]  . ATRIAL FIBRILLATION ABLATION N/A 03/17/2017   Procedure: Atrial Fibrillation Ablation;  Surgeon: [name], MD;  Location: [place] CV LAB;  Service: Cardiovascular;  Laterality: N/A;  . CARDIOVERSION N/A 09/06/2012   Procedure: CARDIOVERSION;   Surgeon: [name], MD;  Location: [place];  Service: Cardiovascular;  Laterality: N/A;  . CARDIOVERSION N/A 01/07/2013   Procedure: CARDIOVERSION;  Surgeon: [name], MD;  Location: [place] ENDOSCOPY;  Service: Cardiovascular;  Laterality: N/A;  . CARDIOVERSION N/A 11/14/2013   Procedure: CARDIOVERSION;  Surgeon: [name], MD;  Location: [facility] ENDOSCOPY;  Service: Cardiovascular;  Laterality: N/A;  . CARDIOVERSION N/A 01/19/2017   Procedure: CARDIOVERSION;  Surgeon: [name], MD;  Location: [facility] ENDOSCOPY;  Service: Cardiovascular;  Laterality: N/A;  . COLONOSCOPY N/A 07/28/2014   Procedure: COLONOSCOPY;  Surgeon: Ladene Artist, MD;  Location: [facility] ENDOSCOPY;  Service: Endoscopy;  Laterality: N/A;  . LAPAROSCOPIC SIGMOID COLECTOMY N/A 08/08/2014   Procedure: LAPAROSCOPIC SIGMOID COLECTOMY Sylvie Farrier PROCTOSCOPY;  Surgeon: Excell Seltzer, MD;  Location: [facility];  Service: General;  Laterality: N/A;  . TEE WITHOUT CARDIOVERSION N/A 09/06/2012   Procedure: TRANSESOPHAGEAL ECHOCARDIOGRAM (TEE);  Surgeon: [name], MD;  Location: [facility] ENDOSCOPY;  Service: Cardiovascular;  Laterality: N/A;  . TEE WITHOUT CARDIOVERSION N/A 11/01/2012   Procedure: TRANSESOPHAGEAL ECHOCARDIOGRAM (TEE);  Surgeon: [name], MD;  Location: [facility] ENDOSCOPY;  Service: Cardiovascular;  Laterality: N/A;  . TUBAL LIGATION     bilateral    Current Outpatient Medications  Medication Sig Dispense Refill  . amitriptyline (ELAVIL) 25 MG tablet Take 50 mg by mouth at bedtime.   3  . atorvastatin (LIPITOR) 10 MG tablet Take 10 mg by mouth at bedtime.     Marland Kitchen  cetirizine (ZYRTEC) 10 MG tablet Take 10 mg by mouth daily.    . Cholecalciferol (VITAMIN D3) 2000 units TABS Take 2,000 Units by mouth daily.    . Continuous Blood Gluc Receiver (FREESTYLE LIBRE 14 DAY READER) DEVI by Does not apply route.    . cyanocobalamin (CVS VITAMIN B12) 2000 MCG tablet Take 2,000 mcg by mouth daily.    Marland Kitchen diltiazem (CARDIZEM CD)  120 MG 24 hr capsule TAKE ONE CAPSULE BY MOUTH DAILY 30 capsule 0  . ELIQUIS 5 MG TABS tablet TAKE 1 TABLET BY MOUTH TWICE DAILY. 180 tablet 2  . famotidine (PEPCID) 20 MG tablet Take 20 mg by mouth 2 (two) times daily.    . folic acid (FOLVITE) 1 MG tablet Take 1 mg by mouth daily.    . furosemide (LASIX) 40 MG tablet Take 1 tablet (40 mg total) by mouth daily. 30 tablet 6  . gabapentin (NEURONTIN) 300 MG capsule Take 300 mg by mouth 3 (three) times daily as needed (for pain.).     Marland Kitchen HUMIRA PEN 40 MG/0.8ML PNKT Inject 40 mg into the skin every 14 (fourteen) days.     . Insulin Syringe-Needle U-100 (INSULIN SYRINGE 1CC/31GX5/16") 31G X 5/16" 1 ML MISC 1 each by Other route 3 (three) times daily.   0  . methimazole (TAPAZOLE) 10 MG tablet TK 1 T PO QD WF  3  . Multiple Vitamin (MULTIVITAMIN WITH MINERALS) TABS Take 1 tablet by mouth daily.    Marland Kitchen NOVOLIN 70/30 RELION (70-30) 100 UNIT/ML injection Inject 35-50 Units into the skin 2 (two) times daily with a meal. 60 units in the morning and 35 units in the evening; 20 units at lunch    . potassium chloride SA (K-DUR,KLOR-CON) 20 MEQ tablet Take 1 tablet (20 mEq total) by mouth daily. 30 tablet 6  . sertraline (ZOLOFT) 100 MG tablet Take 150 mg by mouth every evening.   1  . traZODone (DESYREL) 50 MG tablet Take 100 mg by mouth at bedtime.      No current facility-administered medications for this encounter.     Allergies  Allergen Reactions  . Benicar [Olmesartan] Anaphylaxis  . Latex Hives  . Other Itching and Swelling    White meat chicken causes hands and feet to itch and swell.  Phyllis Ginger [Dofetilide] Other (See Comments)    Torsades after 1 dose of Tikosyn without significant QT prolongation  . Altace [Ramipril]     unknown  . Maxzide [Hydrochlorothiazide W-Triamterene]     unknown  . Tekturna [Aliskiren]     unknown    Social History   Socioeconomic History  . Marital status: Widowed    Spouse name: Not on file  . Number of  children: 4  . Years of education: 10  . Highest education level: Not on file  Occupational History  . Not on file  Social Needs  . Financial resource strain: Not on file  . Food insecurity:    Worry: Not on file    Inability: Not on file  . Transportation needs:    Medical: Not on file    Non-medical: Not on file  Tobacco Use  . Smoking status: Former Smoker    Last attempt to quit: 07/21/1994    Years since quitting: 23.9  . Smokeless tobacco: Never Used  Substance and Sexual Activity  . Alcohol use: No  . Drug use: No  . Sexual activity: Yes  Lifestyle  . Physical activity:  Days per week: Not on file    Minutes per session: Not on file  . Stress: Not on file  Relationships  . Social connections:    Talks on phone: Not on file    Gets together: Not on file    Attends religious service: Not on file    Active member of club or organization: Not on file    Attends meetings of clubs or organizations: Not on file    Relationship status: Not on file  . Intimate partner violence:    Fear of current or ex partner: Not on file    Emotionally abused: Not on file    Physically abused: Not on file    Forced sexual activity: Not on file  Other Topics Concern  . Not on file  Social History Narrative   Lives in [place], daughter with her.  Retired   Four children   Caffeine -none   high school    Family History  Problem Relation Age of Onset  . Heart attack Mother   . Breast cancer Mother   . Diabetes type II Father   . Hypertension Father   . Throat cancer Brother   . Heart attack Paternal Grandmother   . Heart attack Maternal Grandfather   . Heart attack Maternal Grandmother     ROS- All systems are reviewed and negative except as per the HPI above.  Physical Exam: Vitals:   07/05/18 1059  BP: 130/62  Pulse: 71  Weight: 91.2 kg  Height: 5\' 2"  (1.575 m)    GEN- The patient is elderly appearing, alert and oriented x 3 today.   Head- normocephalic,  atraumatic Eyes-  Sclera clear, conjunctiva pink Ears- hearing intact Oropharynx- clear Neck- supple  Lungs- Clear to ausculation bilaterally, normal work of breathing Heart- Irregular rate and rhythm GI- soft, NT, ND, + BS Extremities- no clubbing, cyanosis, or edema MS- no significant deformity or atrophy Skin- no rash or lesion Psych- euthymic mood, full affect Neuro- strength and sensation are intact  Wt Readings from Last 3 Encounters:  07/05/18 91.2 kg  03/24/18 93.1 kg  02/19/18 93 kg    EKG today demonstrates sinus rhythm, rate 71, RBBB  Epic records are reviewed at length today  Assessment and Plan:  1. Persistent atrial fibrillation Maintaining SR post ablation No recurrent AF off AAD therapy Continue Eliquis for CHADS2VASC of 4  2. Morbid obesity Body mass index is 36.76 kg/m. She continues to work on weight loss and has lost additional weight since being seen by Dr [name] Continued weight loss encouraged.   3. HTN Stable No change required today  Follow up with Dr [name] in 3 months    Alison Marshall, NP 07/05/2018 11:24 AM

## 2018-07-16 DIAGNOSIS — Z794 Long term (current) use of insulin: Secondary | ICD-10-CM | POA: Diagnosis not present

## 2018-07-16 DIAGNOSIS — E1165 Type 2 diabetes mellitus with hyperglycemia: Secondary | ICD-10-CM | POA: Diagnosis not present

## 2018-07-20 DIAGNOSIS — I4891 Unspecified atrial fibrillation: Secondary | ICD-10-CM | POA: Diagnosis not present

## 2018-07-20 DIAGNOSIS — I10 Essential (primary) hypertension: Secondary | ICD-10-CM | POA: Diagnosis not present

## 2018-07-20 DIAGNOSIS — I5033 Acute on chronic diastolic (congestive) heart failure: Secondary | ICD-10-CM | POA: Diagnosis not present

## 2018-08-03 ENCOUNTER — Other Ambulatory Visit (HOSPITAL_COMMUNITY): Payer: Self-pay | Admitting: Nurse Practitioner

## 2018-08-16 DIAGNOSIS — I10 Essential (primary) hypertension: Secondary | ICD-10-CM | POA: Diagnosis not present

## 2018-08-16 DIAGNOSIS — E1165 Type 2 diabetes mellitus with hyperglycemia: Secondary | ICD-10-CM | POA: Diagnosis not present

## 2018-08-16 DIAGNOSIS — E7800 Pure hypercholesterolemia, unspecified: Secondary | ICD-10-CM | POA: Diagnosis not present

## 2018-08-16 DIAGNOSIS — Z794 Long term (current) use of insulin: Secondary | ICD-10-CM | POA: Diagnosis not present

## 2018-08-16 DIAGNOSIS — E1122 Type 2 diabetes mellitus with diabetic chronic kidney disease: Secondary | ICD-10-CM | POA: Diagnosis not present

## 2018-08-16 DIAGNOSIS — G609 Hereditary and idiopathic neuropathy, unspecified: Secondary | ICD-10-CM | POA: Diagnosis not present

## 2018-08-16 DIAGNOSIS — E0590 Thyrotoxicosis, unspecified without thyrotoxic crisis or storm: Secondary | ICD-10-CM | POA: Diagnosis not present

## 2018-08-16 LAB — HEMOGLOBIN A1C: Hemoglobin A1C: 9.2

## 2018-08-19 ENCOUNTER — Telehealth: Payer: Self-pay | Admitting: Internal Medicine

## 2018-08-19 NOTE — Telephone Encounter (Signed)
New message   Per Raina at [facility] needs to confirmed that the patient has diabeties or CHF for the patient to be in diabties and heartcare plan hmo. Please call to discuss.

## 2018-08-20 DIAGNOSIS — I10 Essential (primary) hypertension: Secondary | ICD-10-CM | POA: Diagnosis not present

## 2018-08-20 DIAGNOSIS — I4891 Unspecified atrial fibrillation: Secondary | ICD-10-CM | POA: Diagnosis not present

## 2018-08-20 DIAGNOSIS — I5033 Acute on chronic diastolic (congestive) heart failure: Secondary | ICD-10-CM | POA: Diagnosis not present

## 2018-08-24 NOTE — Telephone Encounter (Signed)
[facility] dept has not received a medical records request [place].

## 2018-09-18 DIAGNOSIS — I4891 Unspecified atrial fibrillation: Secondary | ICD-10-CM | POA: Diagnosis not present

## 2018-09-18 DIAGNOSIS — I5033 Acute on chronic diastolic (congestive) heart failure: Secondary | ICD-10-CM | POA: Diagnosis not present

## 2018-09-18 DIAGNOSIS — I10 Essential (primary) hypertension: Secondary | ICD-10-CM | POA: Diagnosis not present

## 2018-09-27 ENCOUNTER — Ambulatory Visit: Payer: PPO | Admitting: Internal Medicine

## 2018-10-11 ENCOUNTER — Other Ambulatory Visit: Payer: Self-pay

## 2018-10-11 ENCOUNTER — Telehealth (INDEPENDENT_AMBULATORY_CARE_PROVIDER_SITE_OTHER): Payer: HMO | Admitting: Internal Medicine

## 2018-10-11 ENCOUNTER — Encounter: Payer: Self-pay | Admitting: Internal Medicine

## 2018-10-11 ENCOUNTER — Other Ambulatory Visit: Payer: Self-pay | Admitting: *Deleted

## 2018-10-11 ENCOUNTER — Encounter: Payer: Self-pay | Admitting: *Deleted

## 2018-10-11 DIAGNOSIS — I4891 Unspecified atrial fibrillation: Secondary | ICD-10-CM

## 2018-10-11 DIAGNOSIS — E11319 Type 2 diabetes mellitus with unspecified diabetic retinopathy without macular edema: Secondary | ICD-10-CM

## 2018-10-11 DIAGNOSIS — I10 Essential (primary) hypertension: Secondary | ICD-10-CM

## 2018-10-11 DIAGNOSIS — M069 Rheumatoid arthritis, unspecified: Secondary | ICD-10-CM

## 2018-10-11 DIAGNOSIS — M0579 Rheumatoid arthritis with rheumatoid factor of multiple sites without organ or systems involvement: Secondary | ICD-10-CM

## 2018-10-11 DIAGNOSIS — E785 Hyperlipidemia, unspecified: Secondary | ICD-10-CM

## 2018-10-11 HISTORY — DX: Rheumatoid arthritis, unspecified: M06.9

## 2018-10-11 NOTE — Addendum Note (Signed)
Addended by: Barrington Ellison on: 10/11/2018 10:56 AM   Modules accepted: Orders

## 2018-10-11 NOTE — Progress Notes (Signed)
Electrophysiology TeleHealth Note   Due to national recommendations of social distancing due to [place] 19, an audio telehealth visit is felt to be most appropriate for this patient at this time.  Verbal consent is obtained today.   Date:  10/11/2018   ID:  Alison Bell, DOB 07-16-1946, MRN [id number]  Location: patient's home  Provider location: [address] [place]  Evaluation Performed: Follow-up visit  PCP:  Deland Pretty, MD  Cardiologist:  Dr [name] Electrophysiologist:  Mazikeen Hehn   Chief Complaint:  CC: afib  History of Present Illness:    Alison Bell is a 73 y.o. female who presents via audio conferencing for a telehealth visit today.  Since last being seen in our clinic, the patient reports doing very well.  Afib is well controlled.  Please with results post ablation. Today, she denies symptoms of palpitations, chest pain, shortness of breath,  lower extremity edema, dizziness, presyncope, or syncope.  The patient is otherwise without complaint today.  The patient denies symptoms of fevers, chills, cough, or new SOB worrisome for COVID 19.   Past Medical History:  Diagnosis Date  . Anemia   . Arthritis    rheumatoid  . Colon adenoma   . Depression   . Diabetes mellitus without complication ([place])   . Difficulty sleeping   . GERD (gastroesophageal reflux disease)   . Headache    migraines  . History of blood transfusion   . HTN (hypertension)   . Hyperlipidemia   . LV dysfunction, EF 45-50% due to a. fib per echo 08/27/12  08/28/2012   a. Echo (10/15):  EF 55-60%, no RWMA, Gr 2 DD, mild MR, mod LAE, normal RVF, mild RAE, mild TR (LA 44 mm)  . Persistent atrial fibrillation   . Rheumatoid arthritis ([place]) 10/11/2018  . Torsades de pointes [facility])    due to [facility] 04/2014    Past Surgical History:  Procedure Laterality Date  . ATRIAL FIBRILLATION ABLATION N/A 11/02/2012   PVI by Dr [name]  . ATRIAL FIBRILLATION ABLATION N/A 03/17/2017   Procedure: Atrial  Fibrillation Ablation;  Surgeon: [name], MD;  Location: [place] CV LAB;  Service: Cardiovascular;  Laterality: N/A;  . CARDIOVERSION N/A 09/06/2012   Procedure: CARDIOVERSION;  Surgeon: [name], MD;  Location: [place];  Service: Cardiovascular;  Laterality: N/A;  . CARDIOVERSION N/A 01/07/2013   Procedure: CARDIOVERSION;  Surgeon: [name], MD;  Location: [place] ENDOSCOPY;  Service: Cardiovascular;  Laterality: N/A;  . CARDIOVERSION N/A 11/14/2013   Procedure: CARDIOVERSION;  Surgeon: [name], MD;  Location: [facility] ENDOSCOPY;  Service: Cardiovascular;  Laterality: N/A;  . CARDIOVERSION N/A 01/19/2017   Procedure: CARDIOVERSION;  Surgeon: [name], MD;  Location: [facility] ENDOSCOPY;  Service: Cardiovascular;  Laterality: N/A;  . COLONOSCOPY N/A 07/28/2014   Procedure: COLONOSCOPY;  Surgeon: Ladene Artist, MD;  Location: [facility] ENDOSCOPY;  Service: Endoscopy;  Laterality: N/A;  . LAPAROSCOPIC SIGMOID COLECTOMY N/A 08/08/2014   Procedure: LAPAROSCOPIC SIGMOID COLECTOMY [facility] PROCTOSCOPY;  Surgeon: Excell Seltzer, MD;  Location: [facility] ORS;  Service: General;  Laterality: N/A;  . TEE WITHOUT CARDIOVERSION N/A 09/06/2012   Procedure: TRANSESOPHAGEAL ECHOCARDIOGRAM (TEE);  Surgeon: [name], MD;  Location: [facility] ENDOSCOPY;  Service: Cardiovascular;  Laterality: N/A;  . TEE WITHOUT CARDIOVERSION N/A 11/01/2012   Procedure: TRANSESOPHAGEAL ECHOCARDIOGRAM (TEE);  Surgeon: [name], MD;  Location: [place];  Service: Cardiovascular;  Laterality: N/A;  . TUBAL LIGATION  bilateral    Current Outpatient Medications  Medication Sig Dispense Refill  . amitriptyline (ELAVIL) 25 MG tablet Take 50 mg by mouth at bedtime.   3  . atorvastatin (LIPITOR) 10 MG tablet Take 10 mg by mouth at bedtime.     . cetirizine (ZYRTEC) 10 MG tablet Take 10 mg by mouth daily.    . Cholecalciferol (VITAMIN D3) 2000 units TABS Take 2,000 Units by mouth daily.    . Continuous Blood Gluc  Receiver (FREESTYLE LIBRE 14 DAY READER) DEVI by Does not apply route.    . cyanocobalamin (CVS VITAMIN B12) 2000 MCG tablet Take 2,000 mcg by mouth daily.    Marland Kitchen diltiazem (CARDIZEM CD) 120 MG 24 hr capsule TAKE ONE CAPSULE BY MOUTH DAILY 30 capsule 6  . ELIQUIS 5 MG TABS tablet TAKE 1 TABLET BY MOUTH TWICE DAILY. 180 tablet 2  . famotidine (PEPCID) 20 MG tablet Take 20 mg by mouth 2 (two) times daily.    . folic acid (FOLVITE) 1 MG tablet Take 1 mg by mouth daily.    . furosemide (LASIX) 40 MG tablet Take 1 tablet (40 mg total) by mouth daily. 30 tablet 6  . gabapentin (NEURONTIN) 300 MG capsule Take 300 mg by mouth 3 (three) times daily as needed (for pain.).     Marland Kitchen HUMIRA PEN 40 MG/0.8ML PNKT Inject 40 mg into the skin every 14 (fourteen) days.     . Insulin Syringe-Needle U-100 (INSULIN SYRINGE 1CC/31GX5/16") 31G X 5/16" 1 ML MISC 1 each by Other route 3 (three) times daily.   0  . methimazole (TAPAZOLE) 10 MG tablet TK 1 T PO QD WF  3  . Multiple Vitamin (MULTIVITAMIN WITH MINERALS) TABS Take 1 tablet by mouth daily.    Marland Kitchen NOVOLIN 70/30 RELION (70-30) 100 UNIT/ML injection Inject 35-50 Units into the skin 2 (two) times daily with a meal. 60 units in the morning and 35 units in the evening; 20 units at lunch    . potassium chloride SA (K-DUR,KLOR-CON) 20 MEQ tablet Take 1 tablet (20 mEq total) by mouth daily. 30 tablet 6  . sertraline (ZOLOFT) 100 MG tablet Take 150 mg by mouth every evening.   1  . traZODone (DESYREL) 50 MG tablet Take 100 mg by mouth at bedtime.      No current facility-administered medications for this visit.     Allergies:   Benicar [olmesartan]; Latex; Other; Tikosyn [dofetilide]; Altace [ramipril]; Maxzide [hydrochlorothiazide w-triamterene]; and Tekturna [aliskiren]   Social History:  The patient  reports that she quit smoking about 24 years ago. She has never used smokeless tobacco. She reports that she does not drink alcohol or use drugs.   Family History:  The  patient's  family history includes Breast cancer in her mother; Diabetes type II in her father; Heart attack in her maternal grandfather, maternal grandmother, mother, and paternal grandmother; Hypertension in her father; Throat cancer in her brother.   ROS:  Please see the history of present illness.   All other systems are personally reviewed and negative.    Exam:    Vital Signs:  BP (!) 148/81   Pulse 66   Strong voice, no distress, not depressed   Labs/Other Tests and Data Reviewed:    Recent Labs: No results found for requested labs within last 8760 hours.   Wt Readings from Last 3 Encounters:  07/05/18 201 lb (91.2 kg)  03/24/18 205 lb 3.2 oz (93.1 kg)  02/19/18 205 lb (93  kg)     Other studies personally reviewed: Additional studies/ records that were reviewed today include: my last office visit  Review of the above records today demonstrates: as above Prior radiographs: high res chest CT- 08/19/17 reveals mild basilar subpleural fibrosis, emphysema   ASSESSMENT & PLAN:    1.  Persistent afib chads2vasc score is 4.  On eliquis She's done great post ablation  2. Obesity Lifestyle modification discussed today  3. HTN Stable No change required today  4. COVID 19 screen The patient denies symptoms of COVID 19 at this time.  The importance of social distancing was discussed today.  Follow-up:  AF clinic in 6 months  Current medicines are reviewed at length with the patient today.   The patient does not have concerns regarding her medicines.  The following changes were made today:  none  Labs/ tests ordered today include:  No orders of the defined types were placed in this encounter.    Patient Risk:  after full review of this patients clinical status, I feel that they are at moderate risk at this time.  Today, I have spent 12 minutes with the patient with telehealth technology discussing afib .    Army Fossa, MD  10/11/2018 12:15 PM     [address] [address] [phone] (office) [phone] (fax)

## 2018-10-11 NOTE — Patient Outreach (Addendum)
  [facility]) Care Management Chronic Special Needs Program  10/11/2018  Name: Alison Bell DOB: 07-13-1946  MRN: [id number]  Alison Bell is enrolled in a chronic special needs plan for  Diabetes. A completed health risk assessment has not been received from the client and client has not responded to outreach attempts by their health care concierge.  The client's individualized care plan was developed based on available data.  Plan:  . Send unsuccessful outreach letter with a copy of individualized care plan to client . Send Emergency planning/management officer to client . Send Neurosurgeon on HTN to client . Refer client to [place] Management pharmacist related to polypharmacy . Send individualized care plan to provider  Chronic care management coordinator, [name], will attempt outreach in 2-4 months.   Barrington Ellison RN,CCM,CDE [place] Management Coordinator Office Phone [phone] Office Fax [phone]

## 2018-10-12 ENCOUNTER — Other Ambulatory Visit: Payer: Self-pay | Admitting: Pharmacist

## 2018-10-12 NOTE — Patient Outreach (Signed)
[facility]) Care Management  [place]   10/12/2018  Laela Deviney Gugel 12/19/1945 [id number]  Reason for referral: Medication Assistance, Medication Review  Referral source: [facility] RN Current insurance: [employer] C-SNP  PMHx includes but not limited to:   CHF, right hip pain, Afib with RVR, type 2 diabetes, COPD, hypertension, history of GI bleed, and rheumatoid arthritis.  Outreach:  Successful telephone call with patient.  HIPAA identifiers verified.   Subjective:  Patient reports using pill box as an adherence strategy.  Does the patient ever forget to take medication?  no Does the patient have problems obtaining medications due to transportation?   no Does the patient have problems obtaining medications due to cost?  no  Does the patient feel that medications prescribed are effective?  yes Does the patient ever experience ce any side effects to the medications prescribed?  no  Does the patient measure his/her own blood glucose at home?  Yes-112 mg/dl this morning Does the patient measure his/her own blood pressure at home? Yes  141/79 HR 66  Objective: Lab Results  Component Value Date   CREATININE 1.21 (H) 03/20/2017   CREATININE 1.15 (H) 03/19/2017   CREATININE 1.11 (H) 03/18/2017    Lab Results  Component Value Date   HGBA1C 5.6 08/04/2014    Lipid Panel     Component Value Date/Time   CHOL 153 05/02/2014 2050   TRIG 116 05/02/2014 2050   HDL 56 05/02/2014 2050   CHOLHDL 2.7 05/02/2014 2050   VLDL 23 05/02/2014 2050   LDLCALC 74 05/02/2014 2050    BP Readings from Last 3 Encounters:  10/11/18 (!) 148/81  07/05/18 130/62  03/24/18 122/68    Allergies  Allergen Reactions  . Altace [Ramipril] Anaphylaxis    Tongue swelled  . Benicar [Olmesartan] Anaphylaxis  . Latex Hives  . Other Itching and Swelling    White meat chicken causes hands and feet to itch and swell.  Phyllis Ginger [Dofetilide] Other (See Comments)     Torsades after 1 dose of Tikosyn without significant QT prolongation  . Maxzide [Hydrochlorothiazide W-Triamterene]     unknown  . Tekturna [Aliskiren]     unknown    Medications Reviewed Today    Reviewed by [name], [facility] (Pharmacist) on 10/12/18 at 1352  Med List Status: <None>  Medication Order Taking? Sig Documenting Provider Last Dose Status Informant  amitriptyline (ELAVIL) 25 MG tablet [id number] Yes Take 50 mg by mouth at bedtime.  [provider] Taking Active Spouse/Significant Other           Med Note [name], MEGAN L   Thu Aug 03, 2014  1:14 PM) .  atorvastatin (LIPITOR) 10 MG tablet [id number] Yes Take 10 mg by mouth at bedtime.  [provider] Taking Active Spouse/Significant Other           Med Note [name], [name]   Fri Nov 11, 2013 12:02 PM)    cetirizine (ZYRTEC) 10 MG tablet [id number] Yes Take 10 mg by mouth daily. [provider] Taking Active Spouse/Significant Other  Cholecalciferol (VITAMIN D3) 2000 units TABS [id number] Yes Take 2,000 Units by mouth daily. [provider] Taking Active Spouse/Significant Other  Continuous Blood Gluc Receiver (FREESTYLE LIBRE 14 DAY READER) DEVI [id number] Yes by Does not apply route. [provider] Taking Active   cyanocobalamin (CVS VITAMIN B12) 2000 MCG tablet [id number]  Take 2,000 mcg by mouth daily. [provider]  Active Spouse/Significant Other  diltiazem (CARDIZEM CD) 120 MG 24 hr capsule [id number] Yes TAKE ONE CAPSULE BY MOUTH DAILY [name], NP Taking Active   ELIQUIS 5 MG TABS tablet [id number] Yes TAKE 1 TABLET BY MOUTH TWICE DAILY. [name], MD Taking Active   famotidine (PEPCID) 20 MG tablet [id number] Yes Take 20 mg by mouth 2 (two) times daily. [provider] Taking Active Spouse/Significant Other  folic acid (FOLVITE) 1 MG tablet [id number] Yes Take 1 mg by mouth daily. [provider] Taking Active Spouse/Significant Other   furosemide (LASIX) 40 MG tablet [id number]  Take 1 tablet (40 mg total) by mouth daily. [name], PA-C  Expired 07/05/18 2359   gabapentin (NEURONTIN) 300 MG capsule [id number] Yes Take 300 mg by mouth 3 (three) times daily as needed (for pain.).  [provider] Taking Active Spouse/Significant Other  HUMIRA PEN 40 MG/0.8ML PNKT [id number] Yes Inject 40 mg into the skin every 14 (fourteen) days.  [provider] Taking Active Spouse/Significant Other  Insulin Syringe-Needle U-100 (INSULIN SYRINGE 1CC/31GX5/16") 31G X 5/16" 1 ML MISC [id number] Yes 1 each by Other route 3 (three) times daily.  [provider] Taking Active Spouse/Significant Other           Med Note [name]   Mon Feb 23, 2017  3:40 PM)    methimazole (TAPAZOLE) 10 MG tablet [id number] Yes TK 1 T PO QD WF [provider] Taking Active   Multiple Vitamin (MULTIVITAMIN WITH MINERALS) TABS [id number] Yes Take 1 tablet by mouth daily. [provider] Taking Active Spouse/Significant Other  NOVOLIN 70/30 RELION (70-30) 100 UNIT/ML injection [id number] Yes 60 units in the morning and 20 units at lunch; 45 units after dinner [provider] Taking Active Spouse/Significant Other           Med Note [name]   Mon Feb 23, 2017  3:40 PM)    potassium chloride SA (K-DUR,KLOR-CON) 20 MEQ tablet [id number] Yes Take 1 tablet (20 mEq total) by mouth daily. [name], MD Taking Active Spouse/Significant Other  sertraline (ZOLOFT) 100 MG tablet [id number] Yes Take 150 mg by mouth every evening.  [provider] Taking Active Spouse/Significant Other           Med Note [name], [name]   Thu Jul 27, 2014 11:38 AM)    traZODone (DESYREL) 50 MG tablet [id number] Yes Take 100 mg by mouth at bedtime.  [provider] Taking Active Spouse/Significant Other          Assessment:  Drugs sorted by system:  Neurologic/Psychologic: Sertraline, Trazodone,  Amitriptyline, Gabapentin  Cardiovascular: Atorvastatin, Furosemide, Diltiazem, Eliquis  Pulmonary/Allergy: Cetirizine  Gastrointestinal: Famotidine  Endocrine: Novolin 70/30, Methimazole   Vitamins/Minerals/Supplements: Potassium, Cholecalciferol, Vitamin F63, Folic Acid, Multiple Vitamin  Miscellaneous: Humira  Medication Review Findings:  . Diabetes therapy and goals- patient's provider is not on the Epic system. A phone call has been placed to find out the goals of care for the patient as well as to get her most recent labs. . Patient reported her HgA1c was >9.6.   Marland Kitchen Her currently therapy may need to be optimized as she is only taking Novolin 70/30.  Marland Kitchen Patient mentioned she used to take metformin but stopped due to cost. . Metformin is a tier 1 medication and has a $0 copay since the patient is on the [facility] program.  . Potential Dose too high-cetirizine-at patient's age, 5mg  is recommended over 10mg   . Potential for Increased  fall risk and CNS depression-amitriptyline, trazodone an gabapentin combination    Medication Assistance Findings:  No medication assistance needs identified  Extra Help:  Already receiving Full Extra Help Low Income Subsidy   Additional medication assistance options reviewed with patient as warranted:  No other options identified  Plan: Will route note to Dr. [name].  Await a call back from Dr. [name] (she had left for the day when I called). Follow up in 5-7 business days.   [name], PharmD, [place] Clinical Pharmacist [phone]

## 2018-10-18 ENCOUNTER — Other Ambulatory Visit: Payer: Self-pay | Admitting: Pharmacist

## 2018-10-18 ENCOUNTER — Ambulatory Visit: Payer: Self-pay | Admitting: Pharmacist

## 2018-10-18 NOTE — Patient Outreach (Addendum)
[facility]) Care Management  10/18/2018  Alison Bell July 20, 1946 [id number]   Dr. Almetta Lovely office was called back about the patient's therapy.  Spoke with Freda Munro who said I would get a call back.  [name], PharmD, BCACP [facility] Clinical Pharmacist [phone]  Plan: Follow up in 1 week if I do not hear back from Dr. [name].   [name], PharmD, [place] Clinical Pharmacist [phone]  Dr. Almetta Lovely nurse called back to gather some information.  It was explained that the patient said metformin was stopped due to cost. (Metformin has a $0 copay). Dr. Almetta Lovely nurse confirmed the patient is supposed to be taking Metformin.  Nurse was informed the patient has Full Extra Help and is on the CSNP Program so the cost of medications will not be a barrier if Dr. [name] would like to intensify the patient's drug therapy.  Patient was called back. Metformin was discussed. Patient confirmed she has not taken metformin in a very long time.   Patient was encouraged to call Dr. Almetta Lovely office.  Patient's A1c was 9.2% according to Dr. Almetta Lovely office at her 08/16/2018 visit.  Patient was unsure about when her next visit would be with all the COVID-19 stuff going on.   Plan: Follow up with the patient in 4-6 weeks.    [name], PharmD, [place] Clinical Pharmacist [phone]

## 2018-10-25 ENCOUNTER — Ambulatory Visit: Payer: Self-pay | Admitting: Pharmacist

## 2018-11-16 ENCOUNTER — Ambulatory Visit: Payer: Self-pay

## 2018-11-18 ENCOUNTER — Ambulatory Visit: Payer: Self-pay

## 2018-11-19 ENCOUNTER — Other Ambulatory Visit: Payer: Self-pay

## 2018-11-19 NOTE — Patient Outreach (Signed)
[facility]) Care Management Chronic Special Needs Program  11/19/2018  Name: Alison Bell DOB: [birth year]  MRN: [id number]  Ms. Alison Bell is enrolled in a chronic special needs plan for Diabetes. Chronic Care Management Coordinator telephoned client to review health risk assessment and to develop individualized care plan.  Introduced the chronic care management program, importance of client participation, and taking their care plan to all provider appointments and inpatient facilities.  Reviewed the transition of care process and possible referral to community care management.  Subjective: Client states that she does not know why her blood sugars are going up in the evening.  States Dr. [name] increased her lunch time insulin when she saw her in January.  States her morning readings have been good ranging 80-125 but her reading are in the 200s up to 300 later in the day.  Denies any low readings.  States she does not exercise due to her arthritis.  States she tries to watch what she eats.  States her atrial fib and blood pressure are controlled.  States her neighbor/landlord helps her around her house if needed.  States she received the Advanced Directives packet but wants to talk to her son in person before getting them completed.  States she is socially isolating and limits her trips out to the pharmacy and grocery store if needed.   Goals Addressed            This Visit's Progress   . COMPLETED:  Acknowledge receipt of Advanced Directive package       Received packet    . Client understands the importance of follow-up with providers by attending scheduled visits   On track   . Client will report no worsening of symptoms of Atrial Fibrillation within the next 9 months      . Client will verbalize knowledge of self management of Hypertension as evidences by BP reading of 140/90 or less; or as defined by provider   On track   . HEMOGLOBIN A1C < 7.0       Diabetes self  management actions:  Glucose monitoring per provider recommendations  Eat Healthy  Check feet daily  Visit provider every 3-6 months as directed  Hbg A1C level every 3-6 months.  Eye Exam yearly    . Maintain timely refills of diabetic medication as prescribed within the year .   On track   . Obtain annual  Lipid Profile, LDL-C   On track   . Obtain Annual Eye (retinal)  Exam    On track   . Obtain Annual Foot Exam   On track   . Obtain annual screen for micro albuminuria (urine) , nephropathy (kidney problems)   On track   . Obtain Hemoglobin A1C at least 2 times per year   On track   . Visit Primary Care Provider or Endocrinologist at least 2 times per year    On track    Client is not meeting diabetes self management goal of hemoglobin A1C of <7% with last reading of 9.2%. [employer] scientist (medical) is working with client and has contacted Dr. [name] about intensifying clients diabetes therapy.  Client reports good adherence to low CHO diet but does not exercise due to limitations with mobility.  Encouraged to increase activity slowly and plan to send copy of chair exercises to client.  Encouraged client to contact Dr. [name] also to report recent blood sugars to see if she wishes to adjust medications.  Declines social  work referral for Advanced Directives at this time  Discussed COVID19 cause, symptoms, precautions (social distancing, stay at home order, hand washing), confirmed client knows how to contact provider.  Plan:  Send successful outreach letter with a copy of their individualized care plan, Send individual care plan to provider and Send educational material Chair exercises, CHO counting sheet  Chronic care management coordination will outreach in:  3 Months     [place], [employer], [place] Management Coordinator [place] Management [phone]

## 2018-11-22 ENCOUNTER — Other Ambulatory Visit: Payer: Self-pay | Admitting: Pharmacist

## 2018-11-22 NOTE — Patient Outreach (Signed)
[place] [facility]) Care Management  11/22/2018  Alison Bell May 18, 1946 [id number]  Called patient to follow up on metformin. HIPAA identifiers were obtained. Patient reported she is still not taking metformin.  Dr. Almetta Bell office confirmed the patient should be taking metformin on 10/18/18.  The patient was called on that day and encouraged to call Dr. Almetta Bell office for a refill. Unfortunately, patient did not call or request a refill on metformin.   Patient was encouraged to call Dr. Almetta Bell office today to discuss metformin. Her HgA1c was 9.2%.  Patient reported her fasting blood sugar this morning was >200 mg/dl.  Patient said she would call Dr. Almetta Bell office today.  Spoke with patient's CSNP Nurse, Alison Garter, RN to discuss metformin.  Medications Reviewed Today    Reviewed by Alison Bell, [facility] (Pharmacist) on 11/22/18 at [name] List Status: <None>  Medication Order Taking? Sig Documenting Provider Last Dose Status Informant  amitriptyline (ELAVIL) 25 MG tablet [id number] Yes Take 50 mg by mouth at bedtime.  [provider] Taking Active Spouse/Significant Other           Med Note [name], MEGAN L   Thu Aug 03, 2014  1:14 PM) .  atorvastatin (LIPITOR) 10 MG tablet [id number] Yes Take 10 mg by mouth at bedtime.  [provider] Taking Active Spouse/Significant Other           Med Note [name], [name]   Fri Nov 11, 2013 12:02 PM)    cetirizine (ZYRTEC) 10 MG tablet [id number] Yes Take 10 mg by mouth daily. [provider] Taking Active Spouse/Significant Other  Cholecalciferol (VITAMIN D3) 2000 units TABS [id number] Yes Take 2,000 Units by mouth daily. [provider] Taking Active Spouse/Significant Other  Continuous Blood Gluc Receiver (FREESTYLE LIBRE 14 DAY READER) DEVI [id number] Yes by Does not apply route. [provider] Taking Active   cyanocobalamin ([employer] VITAMIN B12) 2000 MCG tablet [id number] Yes Take 2,000 mcg by  mouth daily. [provider] Taking Active Spouse/Significant Other  diltiazem (CARDIZEM CD) 120 MG 24 hr capsule [id number] Yes TAKE ONE CAPSULE BY MOUTH DAILY [name], NP Taking Active   ELIQUIS 5 MG TABS tablet [id number] Yes TAKE 1 TABLET BY MOUTH TWICE DAILY. [name], MD Taking Active   famotidine (PEPCID) 20 MG tablet [id number] Yes Take 20 mg by mouth 2 (two) times daily. [provider] Taking Active Spouse/Significant Other  folic acid (FOLVITE) 1 MG tablet [id number] Yes Take 1 mg by mouth daily. [provider] Taking Active Spouse/Significant Other  furosemide (LASIX) 40 MG tablet [id number]  Take 1 tablet (40 mg total) by mouth daily. [name], PA-C  Expired 07/05/18 2359   gabapentin (NEURONTIN) 300 MG capsule [id number] Yes Take 300 mg by mouth 3 (three) times daily as needed (for pain.).  [provider] Taking Active Spouse/Significant Other  HUMIRA PEN 40 MG/0.8ML PNKT [id number] Yes Inject 40 mg into the skin every 14 (fourteen) days.  [provider] Taking Active Spouse/Significant Other  Insulin Syringe-Needle U-100 (INSULIN SYRINGE 1CC/31GX5/16") 31G X 5/16" 1 ML MISC [id number] Yes 1 each by Other route 3 (three) times daily.  [provider] Taking Active Spouse/Significant Other           Med Note [name]   Mon Feb 23, 2017  3:40 PM)    methimazole (TAPAZOLE) 10 MG tablet [id number] Yes TK 1 T PO QD WF [provider] Taking Active  Multiple Vitamin (MULTIVITAMIN WITH MINERALS) TABS [id number] Yes Take 1 tablet by mouth daily. [provider] Taking Active Spouse/Significant Other  NOVOLIN 70/30 RELION (70-30) 100 UNIT/ML injection [id number] Yes 60 units in the morning and 20 units at lunch; 45 units after dinner [provider] Taking Active Spouse/Significant Other           Med Note [name]   Mon Feb 23, 2017  3:40 PM)    potassium chloride SA (K-DUR,KLOR-CON) 20  MEQ tablet [id number] Yes Take 1 tablet (20 mEq total) by mouth daily. [name], MD Taking Active Spouse/Significant Other  sertraline (ZOLOFT) 100 MG tablet [id number] Yes Take 150 mg by mouth every evening.  [provider] Taking Active Spouse/Significant Other           Med Note [name], [name]   Thu Jul 27, 2014 11:38 AM)    traZODone (DESYREL) 50 MG tablet [id number] Yes Take 100 mg by mouth at bedtime.  [provider] Taking Active Spouse/Significant Other          Plan: Call patient back in 3-5 business days to get the outcome of her call with Dr. [name] and to see if help is needed with obtaining a refill.   Alison Bell, PharmD, [place] Clinical Pharmacist [phone]

## 2018-11-24 ENCOUNTER — Other Ambulatory Visit: Payer: Self-pay

## 2018-11-24 NOTE — Patient Outreach (Signed)
  [place] [facility]) Care Management Chronic Special Needs Program   11/24/2018  Name: Alison Bell, DOB: Nov 22, 1945  MRN: [id number]  The client was discussed in today's interdisciplinary care team meeting.  The following issues were discussed:  Client's needs, Key risk triggers/risk stratification, Care Plan and Issues/barriers to care  Participants present:  Mahlon Gammon, MSN, RN, CCM, CNS    Thea Silversmith, MSN, RN, CCM   Gissell Barra RN,BSN,CCM, CDE     Marco Collie, MD Coralie Carpen, MD   Recommendations:  Continue to outreach to endocrinologist about medications   Plan:  Plan to continue to work with [place] pharmacist to outreach to  endocrinologist about medications, Provide education on CHO counting and diet   Follow-up:  As per scheduled outreach  [name] RN, [place], [place] Management [phone]

## 2018-11-25 ENCOUNTER — Other Ambulatory Visit: Payer: Self-pay | Admitting: Pharmacist

## 2018-11-25 NOTE — Patient Outreach (Signed)
[name]) Care Management  11/25/2018  Alison Bell [place] 04-Jul-1946 [id number]  Called patient to follow up on metformin.  HIPAA identifiers were obtained. Patient's HGA1c has remained elevated. Dr. Almetta Lovely office reported they had metformin on the patient's active medication list.  Patient remains stern that she was not told she should still be taking metformin .  She was asked to call Dr. Almetta Lovely office last week to discuss metformin and she made an appointment.  Patient is part of CSNP with [facility] and is actively being managed by a CSNP Nurse.  Plan: Follow up with patient after her appointment with Dr. [name].   [name], PharmD, [place] Clinical Pharmacist [phone]     Appointment with Dr. [name] on 12/15/2018.

## 2018-12-01 DIAGNOSIS — I4891 Unspecified atrial fibrillation: Secondary | ICD-10-CM | POA: Diagnosis not present

## 2018-12-01 DIAGNOSIS — M25561 Pain in right knee: Secondary | ICD-10-CM | POA: Diagnosis not present

## 2018-12-01 DIAGNOSIS — E119 Type 2 diabetes mellitus without complications: Secondary | ICD-10-CM | POA: Diagnosis not present

## 2018-12-01 DIAGNOSIS — M542 Cervicalgia: Secondary | ICD-10-CM | POA: Diagnosis not present

## 2018-12-01 DIAGNOSIS — N289 Disorder of kidney and ureter, unspecified: Secondary | ICD-10-CM | POA: Diagnosis not present

## 2018-12-01 DIAGNOSIS — M0579 Rheumatoid arthritis with rheumatoid factor of multiple sites without organ or systems involvement: Secondary | ICD-10-CM | POA: Diagnosis not present

## 2018-12-01 DIAGNOSIS — M79671 Pain in right foot: Secondary | ICD-10-CM | POA: Diagnosis not present

## 2018-12-01 DIAGNOSIS — M549 Dorsalgia, unspecified: Secondary | ICD-10-CM | POA: Diagnosis not present

## 2018-12-01 DIAGNOSIS — E669 Obesity, unspecified: Secondary | ICD-10-CM | POA: Diagnosis not present

## 2018-12-01 DIAGNOSIS — M1990 Unspecified osteoarthritis, unspecified site: Secondary | ICD-10-CM | POA: Diagnosis not present

## 2018-12-01 DIAGNOSIS — I10 Essential (primary) hypertension: Secondary | ICD-10-CM | POA: Diagnosis not present

## 2018-12-01 DIAGNOSIS — Z79899 Other long term (current) drug therapy: Secondary | ICD-10-CM | POA: Diagnosis not present

## 2018-12-15 DIAGNOSIS — G609 Hereditary and idiopathic neuropathy, unspecified: Secondary | ICD-10-CM | POA: Diagnosis not present

## 2018-12-15 DIAGNOSIS — E0590 Thyrotoxicosis, unspecified without thyrotoxic crisis or storm: Secondary | ICD-10-CM | POA: Diagnosis not present

## 2018-12-15 DIAGNOSIS — E11319 Type 2 diabetes mellitus with unspecified diabetic retinopathy without macular edema: Secondary | ICD-10-CM | POA: Diagnosis not present

## 2018-12-15 DIAGNOSIS — I10 Essential (primary) hypertension: Secondary | ICD-10-CM | POA: Diagnosis not present

## 2018-12-24 ENCOUNTER — Other Ambulatory Visit: Payer: Self-pay | Admitting: Pharmacist

## 2018-12-24 NOTE — Patient Outreach (Signed)
[place] [facility]) Care Management  12/24/2018  Alison Bell 1946-01-10 [id number]   Patient was called for CSNP quarterly follow up.  HIPAA identifiers were obtained. Patient shared that she had a telephone visit with Dr. [name] who said she did not want the patient on metformin. (Metformin had previously been on the patient's medication list and was removed.)  Patient said she was taking all of her medications as instructed and was feeling "fine".   Plan: Follow up with patient in 3 months.   [name], PharmD, [place] Clinical Pharmacist [phone]

## 2019-01-17 ENCOUNTER — Other Ambulatory Visit (HOSPITAL_COMMUNITY): Payer: Self-pay | Admitting: Nurse Practitioner

## 2019-02-13 ENCOUNTER — Other Ambulatory Visit: Payer: Self-pay | Admitting: Internal Medicine

## 2019-02-14 ENCOUNTER — Other Ambulatory Visit: Payer: Self-pay

## 2019-02-14 NOTE — Telephone Encounter (Signed)
Eliquis 5mg  refill request received; pt is 73 yrs old, wt-93.1kg, Crea-1.27 via [facility] from [place] on 02/18/2018, last Telemedicine visit by Dr. [name] on 10/11/2018; will send in refill to requested pharmacy.

## 2019-02-14 NOTE — Patient Outreach (Signed)
[place] [facility]) Care Management Chronic Special Needs Program  02/14/2019  Name: Alison Bell DOB: 1946/05/23  MRN: [id number]  Alison Bell is enrolled in a chronic special needs plan for Diabetes. Reviewed and updated care plan.  Subjective: Client states she had a telephonic visit with Dr. [name] a couple of months ago and she increased her insulin.  States her morning readings have improved with readings of 80-90s but her other readings at lunch and dinner are still up over 300-400 some days.  Denies any low readings.  States she is watching what she eats closely.  States she has not had any labs drawn since January and she has not felt safe to go out with all of her health issues.  States she would like to go to a Podiatrist as her granddaughter is not able to cut her toenails at this time.  Goals Addressed            This Visit's Progress   . Client understands the importance of follow-up with providers by attending scheduled visits   On track   . Client will report no worsening of symptoms of Atrial Fibrillation within the next 9 months   On track   . Client will verbalize knowledge of self management of Hypertension as evidences by BP reading of 140/90 or less; or as defined by provider   On track   . HEMOGLOBIN A1C < 7.0       Continue Diabetes self management actions:  Glucose monitoring per provider recommendations  Eat Healthy  Check feet daily  Visit provider every 3-6 months as directed  Hbg A1C level every 3-6 months.  Eye Exam yearly    . Maintain timely refills of diabetic medication as prescribed within the year .   On track   . Obtain annual  Lipid Profile, LDL-C   On track   . Obtain Annual Eye (retinal)  Exam    On track   . Obtain Annual Foot Exam   On track   . Obtain annual screen for micro albuminuria (urine) , nephropathy (kidney problems)   On track   . Obtain Hemoglobin A1C at least 2 times per year   On track   . Visit Primary Care  Provider or Endocrinologist at least 2 times per year    On track    Client is not meeting diabetes self management goal of hemoglobin A1C of <7% with last reading of 9.2%.  Client has not had Hemoglobin A1C rechecked as she had telephonic visit with Dr. [name].  Reports she is still having elevated blood sugar readings at lunch and dinner.  Reports some improvement in her AM readings after her insulin was increased. Instructed to call and report her blood sugar readings to Dr. [name] to see if she wishes to adjust her insulin more  Reinforced to watch her CHO intake closely and to be sure to have protein with meals and snacks Instructed that she does not need a referral to see a Podiatrist and to call her concierge to get list of in network providers.   Reviewed number for 24 hour nurse Line Discussed COVID19 cause, symptoms, precautions (social distancing, stay at home order, hand washing), confirmed client knows how to contact provider.  Plan:  Send successful outreach letter with a copy of their individualized care plan and Send individual care plan to provider  Chronic care management coordinator will outreach in:  3 Months     Melissa Sandlin  RN, [place], [place] Management [phone]

## 2019-02-25 DIAGNOSIS — Z1159 Encounter for screening for other viral diseases: Secondary | ICD-10-CM | POA: Diagnosis not present

## 2019-02-25 DIAGNOSIS — E0590 Thyrotoxicosis, unspecified without thyrotoxic crisis or storm: Secondary | ICD-10-CM | POA: Diagnosis not present

## 2019-02-25 DIAGNOSIS — I10 Essential (primary) hypertension: Secondary | ICD-10-CM | POA: Diagnosis not present

## 2019-02-25 DIAGNOSIS — E1165 Type 2 diabetes mellitus with hyperglycemia: Secondary | ICD-10-CM | POA: Diagnosis not present

## 2019-02-25 DIAGNOSIS — E7800 Pure hypercholesterolemia, unspecified: Secondary | ICD-10-CM | POA: Diagnosis not present

## 2019-03-02 DIAGNOSIS — K219 Gastro-esophageal reflux disease without esophagitis: Secondary | ICD-10-CM | POA: Diagnosis not present

## 2019-03-02 DIAGNOSIS — N183 Chronic kidney disease, stage 3 (moderate): Secondary | ICD-10-CM | POA: Diagnosis not present

## 2019-03-02 DIAGNOSIS — I5030 Unspecified diastolic (congestive) heart failure: Secondary | ICD-10-CM | POA: Diagnosis not present

## 2019-03-02 DIAGNOSIS — E1122 Type 2 diabetes mellitus with diabetic chronic kidney disease: Secondary | ICD-10-CM | POA: Diagnosis not present

## 2019-03-02 DIAGNOSIS — I4891 Unspecified atrial fibrillation: Secondary | ICD-10-CM | POA: Diagnosis not present

## 2019-03-02 DIAGNOSIS — Z0000 Encounter for general adult medical examination without abnormal findings: Secondary | ICD-10-CM | POA: Diagnosis not present

## 2019-03-02 DIAGNOSIS — F331 Major depressive disorder, recurrent, moderate: Secondary | ICD-10-CM | POA: Diagnosis not present

## 2019-03-02 DIAGNOSIS — I10 Essential (primary) hypertension: Secondary | ICD-10-CM | POA: Diagnosis not present

## 2019-03-02 DIAGNOSIS — G609 Hereditary and idiopathic neuropathy, unspecified: Secondary | ICD-10-CM | POA: Diagnosis not present

## 2019-03-02 DIAGNOSIS — M1990 Unspecified osteoarthritis, unspecified site: Secondary | ICD-10-CM | POA: Diagnosis not present

## 2019-03-02 DIAGNOSIS — J439 Emphysema, unspecified: Secondary | ICD-10-CM | POA: Diagnosis not present

## 2019-03-02 DIAGNOSIS — E0590 Thyrotoxicosis, unspecified without thyrotoxic crisis or storm: Secondary | ICD-10-CM | POA: Diagnosis not present

## 2019-03-24 DIAGNOSIS — E785 Hyperlipidemia, unspecified: Secondary | ICD-10-CM | POA: Diagnosis not present

## 2019-03-24 DIAGNOSIS — E0590 Thyrotoxicosis, unspecified without thyrotoxic crisis or storm: Secondary | ICD-10-CM | POA: Diagnosis not present

## 2019-03-24 DIAGNOSIS — I10 Essential (primary) hypertension: Secondary | ICD-10-CM | POA: Diagnosis not present

## 2019-03-24 DIAGNOSIS — E1122 Type 2 diabetes mellitus with diabetic chronic kidney disease: Secondary | ICD-10-CM | POA: Diagnosis not present

## 2019-03-24 DIAGNOSIS — N289 Disorder of kidney and ureter, unspecified: Secondary | ICD-10-CM | POA: Diagnosis not present

## 2019-04-04 ENCOUNTER — Other Ambulatory Visit: Payer: Self-pay | Admitting: Pharmacist

## 2019-04-04 NOTE — Patient Outreach (Signed)
[place] [facility]) Care Management  04/04/2019  Alison Bell 23-Dec-1945 [id number]  Patient was called for CSNP Follow up. HIPAA identifiers were obtained. Patient reported feeling "just fine".  Patient is a 73 year old female with multiple medical conditions including but not limited to:  CHF, A Fib, type 2 diabetes, hypertension, cardiomyopathy, and rheumatoid arthritis.  Patient reported her blood sugar was 110mg /dl this morning.  Her medications were reviewed:  Medications Reviewed Today    Reviewed by [name], [facility] (Pharmacist) on 04/04/19 at 45  Med List Status: <None>  Medication Order Taking? Sig Documenting Provider Last Dose Status Informant  amitriptyline (ELAVIL) 25 MG tablet [id number] Yes Take 50 mg by mouth at bedtime.  [provider] Taking Active Spouse/Significant Other           Med Note [name], MEGAN L   Thu Aug 03, 2014  1:14 PM) .  atorvastatin (LIPITOR) 10 MG tablet [id number] Yes Take 10 mg by mouth at bedtime.  [provider] Taking Active Spouse/Significant Other           Med Note [name], [name]   Fri Nov 11, 2013 12:02 PM)    cetirizine (ZYRTEC) 10 MG tablet [id number] Yes Take 10 mg by mouth daily. [provider] Taking Active Spouse/Significant Other  Cholecalciferol (VITAMIN D3) 2000 units TABS [id number] Yes Take 2,000 Units by mouth daily. [provider] Taking Active Spouse/Significant Other  Continuous Blood Gluc Receiver (FREESTYLE LIBRE 14 DAY READER) DEVI [id number] Yes by Does not apply route. [provider] Taking Active   Continuous Blood Gluc Sensor (FREESTYLE LIBRE [place]) [place] [id number] Yes Use to test blood sugars [provider] Taking Active   cyanocobalamin ([employer]S VITAMIN B12) 2000 MCG tablet [id number] Yes Take 2,000 mcg by mouth daily. [provider] Taking Active Spouse/Significant Other  diltiazem (CARDIZEM CD) 120 MG 24 hr capsule [id number] Yes TAKE  ONE CAPSULE BY MOUTH DAILY [name], NP Taking Active   ELIQUIS 5 MG TABS tablet [id number] Yes TAKE 1 TABLET BY MOUTH TWICE DAILY. [name], MD Taking Active   famotidine (PEPCID) 20 MG tablet [id number] Yes Take 20 mg by mouth 2 (two) times daily. [provider] Taking Active Spouse/Significant Other  folic acid (FOLVITE) 1 MG tablet [id number] Yes Take 1 mg by mouth daily. [provider] Taking Active Spouse/Significant Other  furosemide (LASIX) 40 MG tablet [id number]  Take 1 tablet (40 mg total) by mouth daily. [name], PA-C  Expired 07/05/18 2359   gabapentin (NEURONTIN) 300 MG capsule [id number] Yes Take 300 mg by mouth 3 (three) times daily as needed (for pain.).  [provider] Taking Active Spouse/Significant Other  HUMIRA PEN 40 MG/0.8ML PNKT [id number] Yes Inject 40 mg into the skin every 14 (fourteen) days.  [provider] Taking Active Spouse/Significant Other  Insulin Syringe-Needle U-100 (INSULIN SYRINGE 1CC/31GX5/16") 31G X 5/16" 1 ML MISC [id number] Yes 1 each by Other route 3 (three) times daily.  [provider] Taking Active Spouse/Significant Other           Med Note [name]   Mon Feb 23, 2017  3:40 PM)    methimazole (TAPAZOLE) 10 MG tablet [id number] Yes TK 1 T PO QD WF [provider] Taking Active   Multiple Vitamin (MULTIVITAMIN WITH MINERALS) TABS [id number] Yes Take 1 tablet by mouth daily. [provider] Taking Active Spouse/Significant Other  NOVOLIN 70/30 RELION (70-30) 100 UNIT/ML injection  [id number] Yes 70 units in the morning and 40 units at lunch; 30 units after dinner [provider] Taking Active Spouse/Significant Other           Med Note [name]   Mon Feb 23, 2017  3:40 PM)    potassium chloride SA (K-DUR,KLOR-CON) 20 MEQ tablet [id number] Yes Take 1 tablet (20 mEq total) by mouth daily. [name], MD Taking Active Spouse/Significant Other  sertraline  (ZOLOFT) 100 MG tablet [id number] Yes Take 150 mg by mouth every evening.  [provider] Taking Active Spouse/Significant Other           Med Note [name], [name]   Thu Jul 27, 2014 11:38 AM)    traZODone (DESYREL) 50 MG tablet [id number] Yes Take 100 mg by mouth at bedtime.  [provider] Taking Active Spouse/Significant Other          Patient reported Dr. [name] increased her 70/30  insulin dose to:  70 units in the morning and 40 units at lunch; 30 units after dinner.   Patient's most recent HgA1c was 8.7% 02/25/2019  On statin, LDL -94 Atorvastatin-last filled 02/13/2019 90 day supply  FBG this morning- 110 mg/dl  Increased risk of CNS depression at patient's age with amitriptyline and trazodone.   Plan: Follow up with patient in 3 months for CSNP follow up.  [name], PharmD, [place] Clinical Pharmacist [phone]

## 2019-04-13 ENCOUNTER — Encounter (HOSPITAL_COMMUNITY): Payer: Self-pay | Admitting: Nurse Practitioner

## 2019-04-13 ENCOUNTER — Ambulatory Visit (HOSPITAL_COMMUNITY)
Admission: RE | Admit: 2019-04-13 | Discharge: 2019-04-13 | Disposition: A | Discharge status: Home / Self Care | Payer: HMO | Source: Ambulatory Visit | Attending: Nurse Practitioner | Admitting: Nurse Practitioner

## 2019-04-13 ENCOUNTER — Other Ambulatory Visit: Payer: Self-pay

## 2019-04-13 VITALS — BP 138/60 | HR 77 | Ht 62.0 in | Wt 206.4 lb

## 2019-04-13 DIAGNOSIS — D649 Anemia, unspecified: Secondary | ICD-10-CM | POA: Insufficient documentation

## 2019-04-13 DIAGNOSIS — Z8249 Family history of ischemic heart disease and other diseases of the circulatory system: Secondary | ICD-10-CM | POA: Diagnosis not present

## 2019-04-13 DIAGNOSIS — Z800 Family history of malignant neoplasm of digestive organs: Secondary | ICD-10-CM | POA: Diagnosis not present

## 2019-04-13 DIAGNOSIS — Z888 Allergy status to other drugs, medicaments and biological substances status: Secondary | ICD-10-CM | POA: Diagnosis not present

## 2019-04-13 DIAGNOSIS — E119 Type 2 diabetes mellitus without complications: Secondary | ICD-10-CM | POA: Diagnosis not present

## 2019-04-13 DIAGNOSIS — Z79899 Other long term (current) drug therapy: Secondary | ICD-10-CM | POA: Diagnosis not present

## 2019-04-13 DIAGNOSIS — Z885 Allergy status to narcotic agent status: Secondary | ICD-10-CM | POA: Insufficient documentation

## 2019-04-13 DIAGNOSIS — Z7901 Long term (current) use of anticoagulants: Secondary | ICD-10-CM | POA: Diagnosis not present

## 2019-04-13 DIAGNOSIS — Z833 Family history of diabetes mellitus: Secondary | ICD-10-CM | POA: Insufficient documentation

## 2019-04-13 DIAGNOSIS — M069 Rheumatoid arthritis, unspecified: Secondary | ICD-10-CM | POA: Insufficient documentation

## 2019-04-13 DIAGNOSIS — I4819 Other persistent atrial fibrillation: Secondary | ICD-10-CM

## 2019-04-13 DIAGNOSIS — K219 Gastro-esophageal reflux disease without esophagitis: Secondary | ICD-10-CM | POA: Diagnosis not present

## 2019-04-13 DIAGNOSIS — I4510 Unspecified right bundle-branch block: Secondary | ICD-10-CM | POA: Diagnosis not present

## 2019-04-13 DIAGNOSIS — I10 Essential (primary) hypertension: Secondary | ICD-10-CM | POA: Insufficient documentation

## 2019-04-13 DIAGNOSIS — Z91040 Latex allergy status: Secondary | ICD-10-CM | POA: Diagnosis not present

## 2019-04-13 DIAGNOSIS — Z91018 Allergy to other foods: Secondary | ICD-10-CM | POA: Insufficient documentation

## 2019-04-13 DIAGNOSIS — Z87891 Personal history of nicotine dependence: Secondary | ICD-10-CM | POA: Insufficient documentation

## 2019-04-13 DIAGNOSIS — R9431 Abnormal electrocardiogram [ECG] [EKG]: Secondary | ICD-10-CM | POA: Diagnosis not present

## 2019-04-13 DIAGNOSIS — I480 Paroxysmal atrial fibrillation: Secondary | ICD-10-CM | POA: Diagnosis not present

## 2019-04-13 DIAGNOSIS — Z803 Family history of malignant neoplasm of breast: Secondary | ICD-10-CM | POA: Insufficient documentation

## 2019-04-13 DIAGNOSIS — F329 Major depressive disorder, single episode, unspecified: Secondary | ICD-10-CM | POA: Insufficient documentation

## 2019-04-13 DIAGNOSIS — E785 Hyperlipidemia, unspecified: Secondary | ICD-10-CM | POA: Diagnosis not present

## 2019-04-13 NOTE — Progress Notes (Signed)
Primary Care Physician: Deland Pretty, MD Referring Physician: Dr. Aurea Graff P [place] is a 73 y.o. female with a h/o afib s/p ablation x 2, last one in 2018 with amiodarone being stopped at that time. She has been doing well staying in [place]. She has not noted any afib at all. No issues with bleeding with eliquis.   Today, she denies symptoms of palpitations, chest pain, shortness of breath, orthopnea, PND, lower extremity edema, dizziness, presyncope, syncope, or neurologic sequela. The patient is tolerating medications without difficulties and is otherwise without complaint today.   Past Medical History:  Diagnosis Date  . Anemia   . Arthritis    rheumatoid  . Colon adenoma   . Depression   . Diabetes mellitus without complication ([place])   . Difficulty sleeping   . GERD (gastroesophageal reflux disease)   . Headache    migraines  . History of blood transfusion   . HTN (hypertension)   . Hyperlipidemia   . LV dysfunction, EF 45-50% due to a. fib per echo 08/27/12  08/28/2012   a. Echo (10/15):  EF 55-60%, no RWMA, Gr 2 DD, mild MR, mod LAE, normal RVF, mild RAE, mild TR (LA 44 mm)  . Persistent atrial fibrillation   . Rheumatoid arthritis ([place]) 10/11/2018  . Torsades de pointes [facility])    due to [facility] 04/2014   Past Surgical History:  Procedure Laterality Date  . ATRIAL FIBRILLATION ABLATION N/A 11/02/2012   PVI by Dr [name]  . ATRIAL FIBRILLATION ABLATION N/A 03/17/2017   Procedure: Atrial Fibrillation Ablation;  Surgeon: [name], MD;  Location: [place] [facility];  Service: Cardiovascular;  Laterality: N/A;  . CARDIOVERSION N/A 09/06/2012   Procedure: CARDIOVERSION;  Surgeon: [name], MD;  Location: [place] [facility];  Service: Cardiovascular;  Laterality: N/A;  . CARDIOVERSION N/A 01/07/2013   Procedure: CARDIOVERSION;  Surgeon: [name], MD;  Location: [place] ENDOSCOPY;  Service: Cardiovascular;  Laterality: N/A;  . CARDIOVERSION N/A 11/14/2013   Procedure:  CARDIOVERSION;  Surgeon: [name], MD;  Location: [facility] ENDOSCOPY;  Service: Cardiovascular;  Laterality: N/A;  . CARDIOVERSION N/A 01/19/2017   Procedure: CARDIOVERSION;  Surgeon: [name], MD;  Location: [facility] ENDOSCOPY;  Service: Cardiovascular;  Laterality: N/A;  . COLONOSCOPY N/A 07/28/2014   Procedure: COLONOSCOPY;  Surgeon: Ladene Artist, MD;  Location: [facility] ENDOSCOPY;  Service: Endoscopy;  Laterality: N/A;  . LAPAROSCOPIC SIGMOID COLECTOMY N/A 08/08/2014   Procedure: LAPAROSCOPIC SIGMOID COLECTOMY [facility] PROCTOSCOPY;  Surgeon: Excell Seltzer, MD;  Location: [facility] ORS;  Service: General;  Laterality: N/A;  . TEE WITHOUT CARDIOVERSION N/A 09/06/2012   Procedure: TRANSESOPHAGEAL ECHOCARDIOGRAM (TEE);  Surgeon: [name], MD;  Location: [facility] ENDOSCOPY;  Service: Cardiovascular;  Laterality: N/A;  . TEE WITHOUT CARDIOVERSION N/A 11/01/2012   Procedure: TRANSESOPHAGEAL ECHOCARDIOGRAM (TEE);  Surgeon: [name], MD;  Location: [facility] ENDOSCOPY;  Service: Cardiovascular;  Laterality: N/A;  . TUBAL LIGATION     bilateral    Current Outpatient Medications  Medication Sig Dispense Refill  . amitriptyline (ELAVIL) 25 MG tablet Take 75 mg by mouth at bedtime.   3  . atorvastatin (LIPITOR) 10 MG tablet Take 10 mg by mouth at bedtime.     . cetirizine (ZYRTEC) 10 MG tablet Take 10 mg by mouth daily.    . Cholecalciferol (VITAMIN D3) 2000 units TABS Take 2,000 Units by mouth daily.    . Continuous Blood Gluc Receiver (FREESTYLE LIBRE 14 DAY READER) DEVI by Does  not apply route.    . Continuous Blood Gluc Sensor (FREESTYLE LIBRE 14 DAY SENSOR) MISC Use to test blood sugars    . cyanocobalamin (CVS VITAMIN B12) 2000 MCG tablet Take 2,000 mcg by mouth daily.     Marland Kitchen diltiazem (CARDIZEM CD) 120 MG 24 hr capsule TAKE ONE CAPSULE BY MOUTH DAILY 30 capsule 6  . ELIQUIS 5 MG TABS tablet TAKE 1 TABLET BY MOUTH TWICE DAILY. 180 tablet 1  . famotidine (PEPCID) 20 MG tablet Take 20 mg by mouth 2 (two)  times daily.    . folic acid (FOLVITE) 1 MG tablet Take 1 mg by mouth daily.    . furosemide (LASIX) 40 MG tablet Take 1 tablet (40 mg total) by mouth daily. 30 tablet 6  . gabapentin (NEURONTIN) 300 MG capsule Take 300 mg by mouth 3 (three) times daily as needed (for pain.).     Marland Kitchen HUMIRA PEN 40 MG/0.8ML PNKT Inject 40 mg into the skin every 14 (fourteen) days.     . Insulin Syringe-Needle U-100 (INSULIN SYRINGE 1CC/31GX5/16") 31G X 5/16" 1 ML MISC 1 each by Other route 3 (three) times daily.   0  . methimazole (TAPAZOLE) 10 MG tablet TK 1 T PO QD WF  3  . Multiple Vitamin (MULTIVITAMIN WITH MINERALS) TABS Take 1 tablet by mouth daily.    Marland Kitchen NOVOLIN 70/30 RELION (70-30) 100 UNIT/ML injection 70 units in the morning and 40 units at lunch; 30 units after dinner    . potassium chloride SA (K-DUR,KLOR-CON) 20 MEQ tablet Take 1 tablet (20 mEq total) by mouth daily. 30 tablet 6  . sertraline (ZOLOFT) 100 MG tablet Take 150 mg by mouth every evening.   1  . traZODone (DESYREL) 50 MG tablet Take 100 mg by mouth at bedtime.      No current facility-administered medications for this encounter.     Allergies  Allergen Reactions  . Altace [Ramipril] Anaphylaxis    Tongue swelled  . Benicar [Olmesartan] Anaphylaxis  . Latex Hives  . Other Itching and Swelling    White meat chicken causes hands and feet to itch and swell.  Phyllis Ginger [Dofetilide] Other (See Comments)    Torsades after 1 dose of Tikosyn without significant QT prolongation  . Maxzide [Hydrochlorothiazide W-Triamterene]     unknown  . Tekturna [Aliskiren]     unknown    Social History   Socioeconomic History  . Marital status: Widowed    Spouse name: Not on file  . Number of children: 4  . Years of education: 74  . Highest education level: Not on file  Occupational History  . Not on file  Social Needs  . Financial resource strain: Not on file  . Food insecurity    Worry: Never true    Inability: Never true  .  Transportation needs    Medical: No    Non-medical: No  Tobacco Use  . Smoking status: Former Smoker    Quit date: 07/21/1994    Years since quitting: 24.7  . Smokeless tobacco: Never Used  Substance and Sexual Activity  . Alcohol use: No  . Drug use: No  . Sexual activity: Yes  Lifestyle  . Physical activity    Days per week: Not on file    Minutes per session: Not on file  . Stress: Not on file  Relationships  . Social Herbalist on phone: Not on file    Gets together: Not on file  Attends religious service: Not on file    Active member of club or organization: Not on file    Attends meetings of clubs or organizations: Not on file    Relationship status: Not on file  . Intimate partner violence    Fear of current or ex partner: Not on file    Emotionally abused: Not on file    Physically abused: Not on file    Forced sexual activity: Not on file  Other Topics Concern  . Not on file  Social History Narrative   Lives in [place], daughter with her.  Retired   Four children   Caffeine -none   high school    Family History  Problem Relation Age of Onset  . Heart attack Mother   . Breast cancer Mother   . Diabetes type II Father   . Hypertension Father   . Throat cancer Brother   . Heart attack Paternal Grandmother   . Heart attack Maternal Grandfather   . Heart attack Maternal Grandmother     ROS- All systems are reviewed and negative except as per the HPI above  Physical Exam: Vitals:   04/13/19 1045  BP: 138/60  Pulse: 77  Weight: 93.6 kg  Height: 5\' 2"  (1.575 m)   Wt Readings from Last 3 Encounters:  04/13/19 93.6 kg  07/05/18 91.2 kg  03/24/18 93.1 kg    Labs: Lab Results  Component Value Date   NA 140 03/20/2017   K 4.0 03/20/2017   CL 100 (L) 03/20/2017   CO2 33 (H) 03/20/2017   GLUCOSE 187 (H) 03/20/2017   BUN 20 03/20/2017   CREATININE 1.21 (H) 03/20/2017   CALCIUM 9.5 03/20/2017   MG 1.9 03/20/2017   Lab Results   Component Value Date   INR 1.04 08/04/2014   Lab Results  Component Value Date   CHOL 153 05/02/2014   HDL 56 05/02/2014   LDLCALC 74 05/02/2014   TRIG 116 05/02/2014     GEN- The patient is well appearing, alert and oriented x 3 today.   Head- normocephalic, atraumatic Eyes-  Sclera clear, conjunctiva pink Ears- hearing intact Oropharynx- clear Neck- supple, no JVP Lymph- no cervical lymphadenopathy Lungs- Clear to ausculation bilaterally, normal work of breathing Heart- Regular rate and rhythm, no murmurs, rubs or gallops, PMI not laterally displaced GI- soft, NT, ND, + BS Extremities- no clubbing, cyanosis, or edema MS- no significant deformity or atrophy Skin- no rash or lesion Psych- euthymic mood, full affect Neuro- strength and sensation are intact  EKG-SR with first degree AV block, pr int at 212 ms, qrs int 130 ms, qtc 475 ms    Assessment and Plan: 1. Paroxysmal afib  Very quiet since last ablatinin 2018 Continue  daily diltiazem   2. HTN Stable   3. CHA2DS2VASc score of at least 4 Continue  eliquis 5 mg bid   F/u with Dr. [name] in 6 months  Geroge Baseman. Bentleigh Stankus, [facility] [address] [place], [place] [address] [phone]

## 2019-05-18 ENCOUNTER — Other Ambulatory Visit: Payer: Self-pay

## 2019-05-18 NOTE — Patient Outreach (Signed)
  [facility]) Care Management Chronic Special Needs Program  05/18/2019  Name: Alison Bell DOB: 1946-02-25  MRN: [id number]  Ms. Alison Bell is enrolled in a chronic special needs plan for Diabetes. Reviewed and updated care plan.  Subjective: Client states she has been doing good.  States her blood sugars have improved since Dr. [name] increased her insulin.  States her sugars range in the low 100's in the morning , around 200 at lunch and 150's at night.  Denies any low readings.  States she is trying to eat healthier.  Denies any falls.  States she has not been able to get her Advanced Directives done yet.  Goals Addressed            This Visit's Progress   . Client understands the importance of follow-up with providers by attending scheduled visits   On track   . Client will report no worsening of symptoms of Atrial Fibrillation within the next 9 months(continue 05/18/19)   On track   . Client will verbalize knowledge of self management of Hypertension as evidences by BP reading of 140/90 or less; or as defined by provider   On track   . HEMOGLOBIN A1C < 7.0       Diabetes self management actions:  Glucose monitoring per provider recommendations  Eat Healthy  Check feet daily  Visit provider every 3-6 months as directed  Hbg A1C level every 3-6 months.  Eye Exam yearly    . Maintain timely refills of diabetic medication as prescribed within the year .   On track   . COMPLETED: Obtain annual  Lipid Profile, LDL-C       Completed 02/25/19    . Obtain Annual Eye (retinal)  Exam    On track    Call to schedule eye appointment    . Obtain Annual Foot Exam   On track   . Obtain annual screen for micro albuminuria (urine) , nephropathy (kidney problems)   On track   . COMPLETED: Obtain Hemoglobin A1C at least 2 times per year       Completed  08/16/18, 12/17/18, 02/25/19    . COMPLETED: Visit Primary Care Provider or Endocrinologist at least 2 times per year        Visits 08/16/18, 03/02/19     Client is not meeting diabetes self management goal of hemoglobin A1C of <7% with last reading of 8.9% which is improved from 9.2% Reinforced to call Dr. [name] if her blood sugars are elevated or if she starts to have hypoglycemia Reinforced to follow a low CHO low sodium diet Instructed to call to schedule her annual eye exam Reviewed number for 24-hour nurse Line Reviewed COVID 19 precautions Plan:  Send successful outreach letter with a copy of their individualized care plan and Send individual care plan to provider  Chronic care management coordinator will outreach in:  3 Months     [name] RN, [facility], [place] Management Coordinator [place] Management [phone]

## 2019-06-06 DIAGNOSIS — M549 Dorsalgia, unspecified: Secondary | ICD-10-CM | POA: Diagnosis not present

## 2019-06-06 DIAGNOSIS — E785 Hyperlipidemia, unspecified: Secondary | ICD-10-CM | POA: Diagnosis not present

## 2019-06-06 DIAGNOSIS — Z79899 Other long term (current) drug therapy: Secondary | ICD-10-CM | POA: Diagnosis not present

## 2019-06-06 DIAGNOSIS — M1990 Unspecified osteoarthritis, unspecified site: Secondary | ICD-10-CM | POA: Diagnosis not present

## 2019-06-06 DIAGNOSIS — M542 Cervicalgia: Secondary | ICD-10-CM | POA: Diagnosis not present

## 2019-06-06 DIAGNOSIS — M0579 Rheumatoid arthritis with rheumatoid factor of multiple sites without organ or systems involvement: Secondary | ICD-10-CM | POA: Diagnosis not present

## 2019-06-06 DIAGNOSIS — M25561 Pain in right knee: Secondary | ICD-10-CM | POA: Diagnosis not present

## 2019-06-06 DIAGNOSIS — N289 Disorder of kidney and ureter, unspecified: Secondary | ICD-10-CM | POA: Diagnosis not present

## 2019-06-06 DIAGNOSIS — I10 Essential (primary) hypertension: Secondary | ICD-10-CM | POA: Diagnosis not present

## 2019-06-06 DIAGNOSIS — M79671 Pain in right foot: Secondary | ICD-10-CM | POA: Diagnosis not present

## 2019-06-06 DIAGNOSIS — I4891 Unspecified atrial fibrillation: Secondary | ICD-10-CM | POA: Diagnosis not present

## 2019-06-21 ENCOUNTER — Other Ambulatory Visit: Payer: Self-pay | Admitting: Pharmacist

## 2019-06-21 NOTE — Patient Outreach (Signed)
[facility]) Care Management  06/21/2019  Alison Bell 1945/11/21 [id number]  Patient was called for CNSP follow up. HIPAA identifiers were obtained.  Patient is a 73 year old female with multiple medical conditions including but not limited to:  CHF, A fib, type 2 diabetes, hypertension, and rheumatoid arthritis.  Patient reported feeling well today. She did report having an issue with getting her Freestyle Sensors. She said she was not able to test her blood sugar today because she was out of sensors.  [facility] was called with the patient on conference call. Spoke with a technician and was able to get the patient her sensors refilled. She said she will pick them up later today.  HgA1c- 8.7% On 70/30 insulin TID was on metformin but discontinued it. Basal bolus may be a more effective regimen but patient has been concerned about the cost.  On statin-atorvastatin- 05/14/2019 #90 filled  Reviewed patient's medications:  Medications Reviewed Today    Reviewed by [name], [facility] (Pharmacist) on 06/21/19 at [place] List Status: <None>  Medication Order Taking? Sig Documenting Provider Last Dose Status Informant  amitriptyline (ELAVIL) 25 MG tablet [id number] Yes Take 75 mg by mouth at bedtime.  [provider] Taking Active Spouse/Significant Other           Med Note [name], MEGAN L   Thu Aug 03, 2014  1:14 PM) .  atorvastatin (LIPITOR) 10 MG tablet [id number] Yes Take 10 mg by mouth at bedtime.  [provider] Taking Active Spouse/Significant Other           Med Note [name], [name]   Fri Nov 11, 2013 12:02 PM)    cetirizine (ZYRTEC) 10 MG tablet [id number] Yes Take 10 mg by mouth daily. [provider] Taking Active Spouse/Significant Other  Cholecalciferol (VITAMIN D3) 2000 units TABS [id number] Yes Take 2,000 Units by mouth daily. [provider] Taking Active Spouse/Significant Other  Continuous Blood Gluc Receiver  (FREESTYLE LIBRE 14 DAY READER) DEVI [id number] Yes by Does not apply route. [provider] Taking Active   Continuous Blood Gluc Sensor (FREESTYLE LIBRE [place]) [place] [id number] Yes Use to test blood sugars [provider] Taking Active   cyanocobalamin (CVS VITAMIN B12) 2000 MCG tablet [id number] Yes Take 2,000 mcg by mouth daily.  [provider] Taking Active Spouse/Significant Other  diltiazem (CARDIZEM CD) 120 MG 24 hr capsule [id number] Yes TAKE ONE CAPSULE BY MOUTH DAILY [name], NP Taking Active   ELIQUIS 5 MG TABS tablet [id number] Yes TAKE 1 TABLET BY MOUTH TWICE DAILY. [name], MD Taking Active   famotidine (PEPCID) 20 MG tablet [id number] Yes Take 20 mg by mouth 2 (two) times daily. [provider] Taking Active Spouse/Significant Other  folic acid (FOLVITE) 1 MG tablet [id number] Yes Take 1 mg by mouth daily. [provider] Taking Active Spouse/Significant Other  furosemide (LASIX) 40 MG tablet [id number]  Take 1 tablet (40 mg total) by mouth daily. [name], PA-C  Expired 04/13/19 2359   gabapentin (NEURONTIN) 300 MG capsule [id number] Yes Take 300 mg by mouth 3 (three) times daily as needed (for pain.).  [provider] Taking Active Spouse/Significant Other  HUMIRA PEN 40 MG/0.8ML PNKT [id number] Yes Inject 40 mg into the skin every 14 (fourteen) days.  [provider] Taking Active Spouse/Significant Other  Insulin Syringe-Needle U-100 (INSULIN SYRINGE 1CC/31GX5/16") 31G X 5/16" 1 ML MISC [id number] Yes 1 each by Other route  3 (three) times daily.  [provider] Taking Active Spouse/Significant Other           Med Note [name]   Mon Feb 23, 2017  3:40 PM)    methimazole (TAPAZOLE) 10 MG tablet [id number] Yes TK 1 T PO QD WF [provider] Taking Active   Multiple Vitamin (MULTIVITAMIN WITH MINERALS) TABS [id number] Yes Take 1 tablet by mouth daily. [provider]  Taking Active Spouse/Significant Other  NOVOLIN 70/30 RELION (70-30) 100 UNIT/ML injection [id number] Yes 70 units in the morning and 40 units at lunch; 30 units after dinner [provider] Taking Active Spouse/Significant Other           Med Note [name]   Mon Feb 23, 2017  3:40 PM)    potassium chloride SA (K-DUR,KLOR-CON) 20 MEQ tablet [id number] Yes Take 1 tablet (20 mEq total) by mouth daily. [name], MD Taking Active Spouse/Significant Other  sertraline (ZOLOFT) 100 MG tablet [id number] Yes Take 150 mg by mouth every evening.  [provider] Taking Active Spouse/Significant Other           Med Note [name], [name]   Thu Jul 27, 2014 11:38 AM)    traZODone (DESYREL) 50 MG tablet [id number] Yes Take 100 mg by mouth at bedtime.  [provider] Taking Active Spouse/Significant Other         Plan: Call patient back in 3 months for CSNP follow up  [name], PharmD, [place] Clinical Pharmacist [phone]

## 2019-07-12 ENCOUNTER — Other Ambulatory Visit (HOSPITAL_COMMUNITY): Payer: Self-pay | Admitting: Nurse Practitioner

## 2019-07-28 DIAGNOSIS — E1122 Type 2 diabetes mellitus with diabetic chronic kidney disease: Secondary | ICD-10-CM | POA: Diagnosis not present

## 2019-07-28 DIAGNOSIS — G609 Hereditary and idiopathic neuropathy, unspecified: Secondary | ICD-10-CM | POA: Diagnosis not present

## 2019-07-28 DIAGNOSIS — I10 Essential (primary) hypertension: Secondary | ICD-10-CM | POA: Diagnosis not present

## 2019-07-28 DIAGNOSIS — Z23 Encounter for immunization: Secondary | ICD-10-CM | POA: Diagnosis not present

## 2019-07-28 DIAGNOSIS — E1165 Type 2 diabetes mellitus with hyperglycemia: Secondary | ICD-10-CM | POA: Diagnosis not present

## 2019-07-28 DIAGNOSIS — E0590 Thyrotoxicosis, unspecified without thyrotoxic crisis or storm: Secondary | ICD-10-CM | POA: Diagnosis not present

## 2019-08-11 ENCOUNTER — Other Ambulatory Visit: Payer: Self-pay | Admitting: Internal Medicine

## 2019-08-11 NOTE — Telephone Encounter (Signed)
Eliquis 5mg  refill request received. Pt is 74yrs old, weight-93.6kg, Crea-1.17 on 02/25/2019 via [place] [facility] [place], and last seen by Roderic Palau on 04/13/2019. Dose is appropriate based on dosing criteria. Will send in refill to requested pharmacy.

## 2019-08-12 ENCOUNTER — Ambulatory Visit: Payer: Self-pay

## 2019-08-18 ENCOUNTER — Other Ambulatory Visit: Payer: Self-pay

## 2019-08-18 NOTE — Patient Outreach (Signed)
[facility]) Care Management Chronic Special Needs Program  08/18/2019  Name: Alison Bell DOB: 09/15/1945  MRN: [id number]  Alison Bell is enrolled in a chronic special needs plan for Diabetes. Chronic Care Management Coordinator telephoned client to review health risk assessment and to develop individualized care plan.  Reviewed the chronic care management program, importance of client participation, and taking their care plan to all provider appointments and inpatient facilities.  Reviewed the transition of care process and possible referral to community care management.  Subjective: Client states she has been doing good.  States she saw Dr.Balan at the first of the year and her Hemoglobin A1C was 9%.  States she did not change her insulin.  States she is scanning her blood sugars 3-4 times a day and they range in the low 100s at breakfast, 170-190 at lunch and 200-300 in the evening.  States she sometimes only eats 2 meals so her midday meal is usually her breakfast.  States she has had a few low reading in the morning.  States she know when to call Dr. [name] with her blood sugars if they are too high or low.  States her granddaughter is living with her now and she is helping her with transportation, shopping and cleaning.  states she is now cooking supper and she is cooking healthy meals.  States she has the Advanced Directives forms but does not wish to complete at this time.  States her B/P is usually in the 120/80 when she checks at home.  Denies any problems with her atrial fibrillation   Goals Addressed            This Visit's Progress   . Client understands the importance of follow-up with providers by attending scheduled visits   On track    Plan to keep scheduled appointments with providers    . Client will report no worsening of symptoms of Atrial Fibrillation within the next 9 months(continue 08/18/19)   On track    No reports of worsening of atrial  fibrillation Plan to take anticoagulant as ordered to prevent stroke    . Client will verbalize knowledge of self management of Hypertension as evidences by BP reading of 140/90 or less; or as defined by provider   On track    Reports B/P below 140/90 when she checks at home and at providers Plan to check B/P regularly Take B/P medications as ordered Plan to follow a low salt diet     . HEMOGLOBIN A1C < 7.0       Reports last Hemoglobin A1C 9%  Scan blood sugars before meals  with goal of 80-130 fasting and 180 or less after meals Diabetes self management actions:  Glucose monitoring per provider recommendations  Eat Healthy  Check feet daily  Visit provider every 3-6 months as directed  Hbg A1C level every 3-6 months.  Eye Exam yearly    . Maintain timely refills of diabetic medication as prescribed within the year .   On track    Maintaining timely refills per dispense report    . COMPLETED: Obtain annual  Lipid Profile, LDL-C       Completed 02/25/19 LDL 94 The goal for LDL is less than 70 mg/dL as you are at high risk for complications Plan to take your atorvastatin as ordered and limit saturated fats    . Obtain Annual Eye (retinal)  Exam    Not on track    Call to schedule  eye appointment The number to your eye doctor [facility] is [phone]    . Obtain Annual Foot Exam   On track    Check your skin and feet every day for cuts, bruises, redness, blisters, or sores. Schedule a foot exam with your health care provider once every year    . Obtain annual screen for micro albuminuria (urine) , nephropathy (kidney problems)   On track    It is important for your doctor to check your urine for protein at least every year    . Obtain Hemoglobin A1C at least 2 times per year   On track    Completed  08/16/18, 12/17/18, 02/25/19 It is important to have your Hemoglobin A1C checked every 6 months if you are at goal and every 3 months if you are not at goal    . Visit Primary Care  Provider or Endocrinologist at least 2 times per year    On track    Plan to keep scheduled visits with primary care provider on 08/31/19 Endocrinologist on 10/26/19     Client is not meeting diabetes self management goal of hemoglobin A1C of <7% with last reading reported of 9% Client is currently active with [employer] scientist (medical) for polypharmacy.  Client does receive extra help with the cost of her medications. Encouraged to call Dr. [name] if her evening blood sugars continue to run higher as she may need her lunch time insulin adjusted Reviewed s/s of hypoglycemia and actions to take and when to notify her endocrinologist Reinforced to follow a low CHO low sodium diet.  Encouraged to watch her portion sizes and to avoid cold cereal Instructed to call and schedule her annual eye exam and given phone number of her eye doctor [facility] [phone] Reinforced importance of getting Advanced Directives completed. Client has forms but does not wish to complete at this time Reviewed number for 24-hour nurse Line Reviewed COVID 19 precautions Encouraged to speak with her rheumatologist about getting the  [place] vaccination   Plan:  Send successful outreach letter with a copy of their individualized care plan, Send individual care plan to provider and Send educational material  Chronic care management coordination will outreach in:  6 Months     [name] RN, [facility], [place] Management Coordinator [place] Management [phone]

## 2019-09-19 ENCOUNTER — Other Ambulatory Visit: Payer: Self-pay | Admitting: Pharmacist

## 2019-09-19 NOTE — Patient Outreach (Signed)
[facility]) Care Management  [place]   09/19/2019  Alison Bell 22-Aug-1945 [id number]  Reason for referral: Medication Assistance, Medication Management  Referral source: [employer] Care Manager with [facility] Current insurance: [employer]  PMHx includes but not limited to:  A Fib, CHF, type 2 diabetes, COPD, hyperlipidemia, hypokalemia and rheumatoid arthritis.  Outreach:  Successful telephone call with patient.  HIPAA identifiers verified.   Subjective:  Patient is a 74 year old female with the above mentioned medical conditions. Patient reported her chronic disease medications have been costing her $9.80 vs $0 since she is a CSNP patient. Patient also has full LIS.  Objective: The ASCVD Risk score [name], et al., 2013) failed to calculate for the following reasons:   Cannot find a previous HDL lab   Cannot find a previous total cholesterol lab  Lab Results  Component Value Date   CREATININE 1.21 (H) 03/20/2017   CREATININE 1.15 (H) 03/19/2017   CREATININE 1.11 (H) 03/18/2017    Lab Results  Component Value Date   HGBA1C 9.2 08/16/2018    Lipid Panel     Component Value Date/Time   CHOL 153 05/02/2014 2050   TRIG 116 05/02/2014 2050   HDL 56 05/02/2014 2050   CHOLHDL 2.7 05/02/2014 2050   VLDL 23 05/02/2014 2050   LDLCALC 74 05/02/2014 2050    BP Readings from Last 3 Encounters:  04/13/19 138/60  10/11/18 (!) 148/81  07/05/18 130/62    Allergies  Allergen Reactions  . Altace [Ramipril] Anaphylaxis    Tongue swelled  . Benicar [Olmesartan] Anaphylaxis  . Latex Hives  . Other Itching and Swelling    White meat chicken causes hands and feet to itch and swell.  Phyllis Ginger [Dofetilide] Other (See Comments)    Torsades after 1 dose of Tikosyn without significant QT prolongation  . Maxzide [Hydrochlorothiazide W-Triamterene]     unknown  . Tekturna [Aliskiren]     unknown    Medications  Reviewed Today    Reviewed by [name], [facility] (Pharmacist) on 09/19/19 at 1301  Med List Status: <None>  Medication Order Taking? Sig Documenting Provider Last Dose Status Informant  amitriptyline (ELAVIL) 25 MG tablet [id number] Yes Take 75 mg by mouth at bedtime.  [provider] Taking Active Spouse/Significant Other           Med Note [name], MEGAN L   Thu Aug 03, 2014  1:14 PM) .  atorvastatin (LIPITOR) 10 MG tablet [id number] Yes Take 10 mg by mouth at bedtime.  [provider] Taking Active Spouse/Significant Other           Med Note [name], [name]   Fri Nov 11, 2013 12:02 PM)    cetirizine (ZYRTEC) 10 MG tablet [id number] Yes Take 10 mg by mouth daily. [provider] Taking Active Spouse/Significant Other  Cholecalciferol (VITAMIN D3) 2000 units TABS [id number] Yes Take 2,000 Units by mouth daily. [provider] Taking Active Spouse/Significant Other  Continuous Blood Gluc Receiver (FREESTYLE LIBRE 14 DAY READER) DEVI [id number] Yes by Does not apply route. [provider] Taking Active   Continuous Blood Gluc Sensor (FREESTYLE LIBRE [place]) [place] [id number] Yes Use to test blood sugars [provider] Taking Active   cyanocobalamin ([employer] VITAMIN B12) 2000 MCG tablet [id number] Yes Take 2,000 mcg by mouth daily.  [provider] Taking Active Spouse/Significant Other  diltiazem (CARDIZEM CD) 120 MG 24 hr  capsule [id number] Yes TAKE ONE CAPSULE BY MOUTH DAILY [name], NP Taking Active   ELIQUIS 5 MG TABS tablet [id number] Yes TAKE 1 TABLET BY MOUTH TWICE DAILY. [name], NP Taking Active   famotidine (PEPCID) 20 MG tablet [id number] Yes Take 20 mg by mouth 2 (two) times daily. [provider] Taking Active Spouse/Significant Other  folic acid (FOLVITE) 1 MG tablet [id number] Yes Take 1 mg by mouth daily. [provider] Taking Active Spouse/Significant Other  furosemide (LASIX) 40 MG tablet  [id number]  Take 1 tablet (40 mg total) by mouth daily. [name], PA-C  Expired 04/13/19 2359   gabapentin (NEURONTIN) 300 MG capsule [id number] Yes Take 300 mg by mouth 3 (three) times daily as needed (for pain.).  [provider] Taking Active Spouse/Significant Other  HUMIRA PEN 40 MG/0.8ML PNKT [id number] Yes Inject 40 mg into the skin every 14 (fourteen) days.  [provider] Taking Active Spouse/Significant Other  Insulin Syringe-Needle U-100 (INSULIN SYRINGE 1CC/31GX5/16") 31G X 5/16" 1 ML MISC [id number] Yes 1 each by Other route 3 (three) times daily.  [provider] Taking Active Spouse/Significant Other           Med Note [name]   Mon Feb 23, 2017  3:40 PM)    methimazole (TAPAZOLE) 10 MG tablet [id number] Yes TK 1 T PO QD WF [provider] Taking Active   Multiple Vitamin (MULTIVITAMIN WITH MINERALS) TABS [id number] Yes Take 1 tablet by mouth daily. [provider] Taking Active Spouse/Significant Other  NOVOLIN 70/30 RELION (70-30) 100 UNIT/ML injection [id number] Yes 70 units in the morning and 30 units at lunch; 30 units after dinner [provider] Taking Active Spouse/Significant Other           Med Note [name]   Mon Feb 23, 2017  3:40 PM)    potassium chloride SA (K-DUR,KLOR-CON) 20 MEQ tablet [id number] Yes Take 1 tablet (20 mEq total) by mouth daily. [name], MD Taking Active Spouse/Significant Other  sertraline (ZOLOFT) 100 MG tablet [id number] Yes Take 150 mg by mouth every evening.  [provider] Taking Active Spouse/Significant Other           Med Note [name], [name]   Thu Jul 27, 2014 11:38 AM)    traZODone (DESYREL) 50 MG tablet [id number] Yes Take 100 mg by mouth at bedtime.  [provider] Taking Active Spouse/Significant Other          Assessment:    Medication Assistance Findings:  Medication assistance needs identified: patient reported her diabetes  medications have been costing her $9.80 despite her being in the donut hole.  Extra Help:  Already receiving Full Extra Help Low Income Subsidy  Patient Assistance Programs: -Not eligible due to Full LIS -Called [employer] about the patient's drug pricing -Gregary Signs at [employer] said there had been an issue and [employer] was trying to figure out how they were going to reimburse the patient.   Plan: . Will follow-up in 2-3 business days.   [name], PharmD, [place] Clinical Pharmacist [phone]

## 2019-09-23 ENCOUNTER — Other Ambulatory Visit: Payer: Self-pay | Admitting: Pharmacist

## 2019-09-23 NOTE — Patient Outreach (Signed)
[facility]) Care Management  09/23/2019  Ebenezer Lazarin Negash August 28, 1945 [id number]   Called patient to follow up with her. HIPAA identifiers were obtained.   Patient previously mentioned she was being charged a copay with [facility]. [employer] was called on her behalf.   Patient was billed in error and will be receiving a refund from [employer] as there was a glitch in the system.  Patient confirmed someone from [employer] reached out to her and she will be looking for her refund to be delivered via check to her home in the next few weeks.  Plan: Close patient's pharmacy case as she she does not have any other pharmacy needs. She has my contact information and can reach out at anytime in the future.  [name], PharmD, [place] Clinical Pharmacist [phone]

## 2019-10-25 ENCOUNTER — Other Ambulatory Visit (HOSPITAL_COMMUNITY): Payer: Self-pay | Admitting: Nurse Practitioner

## 2019-10-25 ENCOUNTER — Other Ambulatory Visit (HOSPITAL_COMMUNITY): Payer: Self-pay

## 2019-10-25 MED ORDER — DILTIAZEM HCL ER COATED BEADS 120 MG PO CP24: 120.0000 mg | ORAL_CAPSULE | Freq: Every day | ORAL | 0 refills | Status: DC

## 2019-10-26 DIAGNOSIS — E7800 Pure hypercholesterolemia, unspecified: Secondary | ICD-10-CM | POA: Diagnosis not present

## 2019-10-26 DIAGNOSIS — N183 Chronic kidney disease, stage 3 unspecified: Secondary | ICD-10-CM | POA: Diagnosis not present

## 2019-10-26 DIAGNOSIS — E1122 Type 2 diabetes mellitus with diabetic chronic kidney disease: Secondary | ICD-10-CM | POA: Diagnosis not present

## 2019-10-26 DIAGNOSIS — I10 Essential (primary) hypertension: Secondary | ICD-10-CM | POA: Diagnosis not present

## 2019-10-26 DIAGNOSIS — E0590 Thyrotoxicosis, unspecified without thyrotoxic crisis or storm: Secondary | ICD-10-CM | POA: Diagnosis not present

## 2019-11-24 ENCOUNTER — Other Ambulatory Visit (HOSPITAL_COMMUNITY): Payer: Self-pay | Admitting: Nurse Practitioner

## 2019-12-05 ENCOUNTER — Other Ambulatory Visit (HOSPITAL_COMMUNITY): Payer: Self-pay

## 2019-12-05 DIAGNOSIS — M0579 Rheumatoid arthritis with rheumatoid factor of multiple sites without organ or systems involvement: Secondary | ICD-10-CM | POA: Diagnosis not present

## 2019-12-05 DIAGNOSIS — M25561 Pain in right knee: Secondary | ICD-10-CM | POA: Diagnosis not present

## 2019-12-05 DIAGNOSIS — M549 Dorsalgia, unspecified: Secondary | ICD-10-CM | POA: Diagnosis not present

## 2019-12-05 DIAGNOSIS — Z79899 Other long term (current) drug therapy: Secondary | ICD-10-CM | POA: Diagnosis not present

## 2019-12-05 DIAGNOSIS — I10 Essential (primary) hypertension: Secondary | ICD-10-CM | POA: Diagnosis not present

## 2019-12-05 DIAGNOSIS — E785 Hyperlipidemia, unspecified: Secondary | ICD-10-CM | POA: Diagnosis not present

## 2019-12-05 DIAGNOSIS — E119 Type 2 diabetes mellitus without complications: Secondary | ICD-10-CM | POA: Diagnosis not present

## 2019-12-05 DIAGNOSIS — I4891 Unspecified atrial fibrillation: Secondary | ICD-10-CM | POA: Diagnosis not present

## 2019-12-05 DIAGNOSIS — M1990 Unspecified osteoarthritis, unspecified site: Secondary | ICD-10-CM | POA: Diagnosis not present

## 2019-12-05 DIAGNOSIS — N289 Disorder of kidney and ureter, unspecified: Secondary | ICD-10-CM | POA: Diagnosis not present

## 2019-12-05 MED ORDER — APIXABAN 5 MG PO TABS: 5.0000 mg | ORAL_TABLET | Freq: Two times a day (BID) | ORAL | 0 refills | Status: DC

## 2020-02-07 ENCOUNTER — Other Ambulatory Visit: Payer: Self-pay

## 2020-02-07 NOTE — Patient Outreach (Signed)
[place] [facility]) Care Management Chronic Special Needs Program  02/07/2020  Name: Alison Bell DOB: 07-01-1946  MRN: [id number]  Alison Bell is enrolled in a chronic special needs plan for Diabetes. Reviewed and updated care plan.  Subjective:Client states that she has been feeling good.  States that her blood sugars have been improving and her Hemoglobin A1C was 7.8% when the [facility] provider checked it. States she saw Dr. [name] a few months ago and she did not change her insulin doses.    States she is using her Freestyle [place] and scans at least three times a day.  States she ranges from 60-150 in the morning, around 150 at lunch and in the 200's at bedtime.  States she has low readings in the morning when she gets up but she does not feel low.  States she drinks juice and eats her breakfast .  States she has not been eating a bedtime snack.  States she has not gotten her food stamps for the last 2 months and her food budget is very tight.  States she does not go without food.  States she is trying to eat more salads and fewer CHOs.  States she has not called to make an eye appointment yet.  Denies any pain or falls. States her atrial fibrillation has not been a problem recently.  States her B/P has been good around 120/70  States she has the Advanced Directives forms but has not completed them.  States she has not gotten the COVID shots and she does not believe in getting them.    Goals Addressed              This Visit's Progress   .  COMPLETED: Client understands the importance of follow-up with providers by attending scheduled visits   On track     Keeping scheduled appointments with providers Plan to keep scheduled appointments with providers Goal completed 02/07/20     .  Client will have food insecurities addressed by next 6 months        Referral made to [facility] for social worker to address food insecurity     .  Client will report no worsening of symptoms of  Atrial Fibrillation within the next 9 months(continue 02/07/20)   On track     No reports of worsening of atrial fibrillation Plan to take anticoagulant as ordered to prevent stroke    .  Client will verbalize knowledge of self management of Hypertension as evidences by BP reading of 140/90 or less; or as defined by provider   On track     Reports B/P below 140/90 when she checks at home and at providers Plan to check B/P regularly Take B/P medications as ordered Plan to follow a low salt diet     .  HEMOGLOBIN A1C < 7 (pt-stated)        Last Hemoglobin A1C 7.8% on 01/12/20 Scan blood sugars before meals  with goal of 80-130 fasting and 180 or less after meals Reinforced to follow a low carbohydrate low salt diet and to watch portion sizes Reviewed use and possible side effects of diabetes medications  Reviewed signs and symptoms of hypoglycemia and actions to take Plan to eat a snack at bedtime with protein such as peanut butter, cheese or nuts with cracker or fruit Please consider getting COVID vaccination as you are at high risk     .  COMPLETED: Maintain timely refills of diabetic medication as prescribed  within the year .   On track     Maintaining timely refills per dispense report Goal completed 02/07/20    .  Obtain annual  Lipid Profile, LDL-C   On track     Completed 02/25/19 LDL 94 The goal for LDL is less than 70 mg/dL as you are at high risk for complications Plan to take your atorvastatin as ordered and limit saturated fats    .  Obtain Annual Eye (retinal)  Exam    Not on track     It is important to have a dilated eye exam each year Call to schedule eye appointment The number to your eye doctor [facility] is [phone]    .  COMPLETED: Obtain Annual Foot Exam   On track     Reports completed by [facility] on 01/12/20 Check your skin and feet every day for cuts, bruises, redness, blisters, or sores. Schedule a foot exam with your health care provider once every year Goal  completed 02/07/20    .  Obtain annual screen for micro albuminuria (urine) , nephropathy (kidney problems)   On track     It is important for your doctor to check your urine for protein at least every year    .  Obtain Hemoglobin A1C at least 2 times per year   On track     Completed  01/12/20 It is important to have your Hemoglobin A1C checked every 6 months if you are at goal and every 3 months if you are not at goal    .  Visit Primary Care Provider or Endocrinologist at least 2 times per year    On track     Primary care provider 03/01/20 Endocrinologist  10/26/19 [facility] provider 01/12/20 next scheduled visit 07/23/20 Please schedule your annual wellness visit     Client now actively engaged with [facility].  Last seen by [facility] provider 01/12/20.  Reviewed [facility] plan of care for client.  Client has had visit with [facility] dietitian to help with diet choices. Communicated to [facility] of client's need for their social worker to assist client with food resources and that she is past due for dilated eye exam Plan:  Send successful outreach letter with a copy of their individualized care plan and Send individual care plan to provider  Chronic care management coordinator will review [facility]'s plan of care and will collaborate with [facility] team as indicated in 6 Months  Client will be outreached by a [employer] RNCM in 6 months per tier level   Will refer to:  Social Work([facility])   [name] RN, [facility], [place] Management Coordinator [place] [facility] [phone]

## 2020-02-15 ENCOUNTER — Ambulatory Visit: Payer: HMO

## 2020-02-27 DIAGNOSIS — I10 Essential (primary) hypertension: Secondary | ICD-10-CM | POA: Diagnosis not present

## 2020-02-27 DIAGNOSIS — E0590 Thyrotoxicosis, unspecified without thyrotoxic crisis or storm: Secondary | ICD-10-CM | POA: Diagnosis not present

## 2020-02-27 DIAGNOSIS — E1122 Type 2 diabetes mellitus with diabetic chronic kidney disease: Secondary | ICD-10-CM | POA: Diagnosis not present

## 2020-02-27 DIAGNOSIS — E1165 Type 2 diabetes mellitus with hyperglycemia: Secondary | ICD-10-CM | POA: Diagnosis not present

## 2020-03-07 DIAGNOSIS — I10 Essential (primary) hypertension: Secondary | ICD-10-CM | POA: Diagnosis not present

## 2020-03-07 DIAGNOSIS — N390 Urinary tract infection, site not specified: Secondary | ICD-10-CM | POA: Diagnosis not present

## 2020-03-14 DIAGNOSIS — I517 Cardiomegaly: Secondary | ICD-10-CM | POA: Diagnosis not present

## 2020-03-14 DIAGNOSIS — K219 Gastro-esophageal reflux disease without esophagitis: Secondary | ICD-10-CM | POA: Diagnosis not present

## 2020-03-14 DIAGNOSIS — J849 Interstitial pulmonary disease, unspecified: Secondary | ICD-10-CM | POA: Diagnosis not present

## 2020-03-14 DIAGNOSIS — M0579 Rheumatoid arthritis with rheumatoid factor of multiple sites without organ or systems involvement: Secondary | ICD-10-CM | POA: Diagnosis not present

## 2020-03-14 DIAGNOSIS — E1122 Type 2 diabetes mellitus with diabetic chronic kidney disease: Secondary | ICD-10-CM | POA: Diagnosis not present

## 2020-03-14 DIAGNOSIS — I4510 Unspecified right bundle-branch block: Secondary | ICD-10-CM | POA: Diagnosis not present

## 2020-03-14 DIAGNOSIS — R0989 Other specified symptoms and signs involving the circulatory and respiratory systems: Secondary | ICD-10-CM | POA: Diagnosis not present

## 2020-03-14 DIAGNOSIS — R413 Other amnesia: Secondary | ICD-10-CM | POA: Diagnosis not present

## 2020-03-14 DIAGNOSIS — N1831 Chronic kidney disease, stage 3a: Secondary | ICD-10-CM | POA: Diagnosis not present

## 2020-03-14 DIAGNOSIS — D6869 Other thrombophilia: Secondary | ICD-10-CM | POA: Diagnosis not present

## 2020-03-14 DIAGNOSIS — I5030 Unspecified diastolic (congestive) heart failure: Secondary | ICD-10-CM | POA: Diagnosis not present

## 2020-03-14 DIAGNOSIS — I4891 Unspecified atrial fibrillation: Secondary | ICD-10-CM | POA: Diagnosis not present

## 2020-03-14 DIAGNOSIS — N3000 Acute cystitis without hematuria: Secondary | ICD-10-CM | POA: Diagnosis not present

## 2020-03-14 DIAGNOSIS — Z0000 Encounter for general adult medical examination without abnormal findings: Secondary | ICD-10-CM | POA: Diagnosis not present

## 2020-03-14 DIAGNOSIS — I10 Essential (primary) hypertension: Secondary | ICD-10-CM | POA: Diagnosis not present

## 2020-03-14 DIAGNOSIS — F331 Major depressive disorder, recurrent, moderate: Secondary | ICD-10-CM | POA: Diagnosis not present

## 2020-03-21 ENCOUNTER — Other Ambulatory Visit: Payer: Self-pay

## 2020-03-21 NOTE — Patient Outreach (Signed)
  [facility]) Care Management Chronic Special Needs Program    03/21/2020  Name: Alison Bell, DOB: 13-Apr-1946  MRN: [id number]   Alison Bell was enrolled in a chronic special needs plan for Diabetes. Case closed as client has disenrolled from [place] to send case closure letter to [facility] [employer] to notify client of dis-enrollment from [place], [facility], [place] Management Coordinator [place] [facility] [phone]

## 2020-03-30 ENCOUNTER — Other Ambulatory Visit (HOSPITAL_COMMUNITY): Payer: Self-pay | Admitting: Internal Medicine

## 2020-03-30 ENCOUNTER — Other Ambulatory Visit (HOSPITAL_COMMUNITY): Payer: Self-pay | Admitting: Nurse Practitioner

## 2020-04-19 ENCOUNTER — Encounter: Payer: Self-pay | Admitting: *Deleted

## 2020-04-23 ENCOUNTER — Encounter: Payer: Self-pay | Admitting: Diagnostic Neuroimaging

## 2020-04-23 ENCOUNTER — Ambulatory Visit (INDEPENDENT_AMBULATORY_CARE_PROVIDER_SITE_OTHER): Payer: Medicare HMO | Admitting: Diagnostic Neuroimaging

## 2020-04-23 VITALS — BP 164/73 | HR 73 | Ht 62.0 in | Wt 193.8 lb

## 2020-04-23 DIAGNOSIS — R413 Other amnesia: Secondary | ICD-10-CM

## 2020-04-23 DIAGNOSIS — G2581 Restless legs syndrome: Secondary | ICD-10-CM

## 2020-04-23 DIAGNOSIS — G250 Essential tremor: Secondary | ICD-10-CM | POA: Diagnosis not present

## 2020-04-23 NOTE — Progress Notes (Signed)
[facility]  PATIENT: Alison Bell DOB: 05-Jan-1946  REFERRING CLINICIAN: Audie Pinto HISTORY FROM: patient and chart review REASON FOR VISIT: new consult    HISTORICAL  CHIEF COMPLAINT:  Chief Complaint  Patient presents with  . Memory Loss    rm 6 MMSE 22  . Neuropathy, RLS    HISTORY OF PRESENT ILLNESS:   UPDATE (04/23/20, VRP): Since last visit, doing well. Symptoms are stable. Severity is mild. No alleviating or aggravating factors. Tolerating meds. Some memory loss. Insomnia better on higher amitriptyline and trazodone.   PRIOR HPI (06/23/17): 74 year old left-handed female here for evaluation of tremor.  History of diabetes,  heart disease, atrial fibrillation migraine and depression.  Patient has had intermittent tremors, postural and action, 3 years ago.  This is worse with activities and certain positions.  In last 2 months this is significantly worsened.  This is affecting her quality of life and day-to-day activities.  No family history of tremor.  No family history of Parkinson's disease.  Patient uses a Rollator walker for gait difficulty.  She uses home oxygen.  Patient has been diagnosed with hyperthyroidism, and has follow-up with endocrinology.  Patient feels that her tremor has worsened in the same timeframe that her thyroid function has been abnormal.    REVIEW OF SYSTEMS: Full 14 system review of systems performed and negative with exception of: Weight gain fatigue blurred vision shortness of breath wheezing feeling cold memory loss headaches snoring restless legs depression not enough sleep allergies.  ALLERGIES: Allergies  Allergen Reactions  . Altace [Ramipril] Anaphylaxis    Tongue swelled  . Benicar [Olmesartan] Anaphylaxis  . Latex Hives  . Other Itching and Swelling    White meat chicken causes hands and feet to itch and swell.  Phyllis Ginger [Dofetilide] Other (See Comments)    Torsades after 1 dose of Tikosyn without significant QT  prolongation  . Maxzide [Hydrochlorothiazide W-Triamterene]     unknown  . Tekturna [Aliskiren]     unknown    HOME MEDICATIONS: Outpatient Medications Prior to Visit  Medication Sig Dispense Refill  . apixaban (ELIQUIS) 5 MG TABS tablet Take 1 tablet (5 mg total) by mouth 2 (two) times daily. appt required for refills [id number] 60 tablet 0  . cetirizine (ZYRTEC) 10 MG tablet Take 10 mg by mouth daily.    . Cholecalciferol (VITAMIN D3) 2000 units TABS Take 2,000 Units by mouth daily.    . Continuous Blood Gluc Receiver (FREESTYLE LIBRE 14 DAY READER) DEVI by Does not apply route.    . Continuous Blood Gluc Sensor (FREESTYLE LIBRE 14 DAY SENSOR) MISC Use to test blood sugars    . cyanocobalamin (CVS VITAMIN B12) 2000 MCG tablet Take 2,000 mcg by mouth daily.     Marland Kitchen diltiazem (CARDIZEM CD) 120 MG 24 hr capsule Take 1 capsule (120 mg total) by mouth daily. APPOINTMENT NEEDED FOR FURTHER REFILLS. 1ST ATTEMPT 30 capsule 0  . famotidine (PEPCID) 20 MG tablet Take 20 mg by mouth 2 (two) times daily.    . folic acid (FOLVITE) 1 MG tablet Take 1 mg by mouth daily.    . furosemide (LASIX) 40 MG tablet Take 1 tablet (40 mg total) by mouth daily. 30 tablet 6  . HUMIRA PEN 40 MG/0.8ML PNKT Inject 40 mg into the skin every 14 (fourteen) days.     . Insulin Syringe-Needle U-100 (INSULIN SYRINGE 1CC/31GX5/16") 31G X 5/16" 1 ML MISC 1 each by Other route 3 (three) times  daily.   0  . methimazole (TAPAZOLE) 10 MG tablet TK 1 T PO QD WF  3  . Multiple Vitamin (MULTIVITAMIN WITH MINERALS) TABS Take 1 tablet by mouth daily.    Marland Kitchen NOVOLIN 70/30 RELION (70-30) 100 UNIT/ML injection 70 units in the morning and 30 units at lunch; 30 units after dinner    . potassium chloride SA (K-DUR,KLOR-CON) 20 MEQ tablet Take 1 tablet (20 mEq total) by mouth daily. 30 tablet 6  . amitriptyline (ELAVIL) 25 MG tablet Take 75 mg by mouth at bedtime.  (Patient not taking: Reported on 04/23/2020)  3  . atorvastatin (LIPITOR) 10 MG  tablet Take 10 mg by mouth at bedtime.  (Patient not taking: Reported on 04/23/2020)    . gabapentin (NEURONTIN) 300 MG capsule Take 300 mg by mouth 3 (three) times daily as needed (for pain.).  (Patient not taking: Reported on 04/23/2020)    . sertraline (ZOLOFT) 100 MG tablet Take 150 mg by mouth every evening.  (Patient not taking: Reported on 04/23/2020)  1  . traZODone (DESYREL) 50 MG tablet Take 100 mg by mouth at bedtime.  (Patient not taking: Reported on 04/23/2020)     No facility-administered medications prior to visit.    PAST MEDICAL HISTORY: Past Medical History:  Diagnosis Date  . Anemia   . Arthritis    rheumatoid  . Atrial fibrillation ([place])   . CKD (chronic kidney disease)   . Colon adenoma   . Depression   . Diabetes mellitus without complication ([place])   . Diastolic heart failure ([place])   . Difficulty sleeping   . GERD (gastroesophageal reflux disease)   . Headache    migraines  . History of blood transfusion   . HTN (hypertension)   . Hyperlipidemia   . Insomnia   . LV dysfunction, EF 45-50% due to a. fib per echo 08/27/12  08/28/2012   a. Echo (10/15):  EF 55-60%, no RWMA, Gr 2 DD, mild MR, mod LAE, normal RVF, mild RAE, mild TR (LA 44 mm)  . Memory difficulty   . Neuropathy   . Persistent atrial fibrillation ([place])   . Rheumatoid arthritis ([place]) 10/11/2018  . RLS (restless legs syndrome)   . Torsades de pointes ([place])    due to [facility] 04/2014    PAST SURGICAL HISTORY: Past Surgical History:  Procedure Laterality Date  . ATRIAL FIBRILLATION ABLATION N/A 11/02/2012   PVI by Dr [name]  . ATRIAL FIBRILLATION ABLATION N/A 03/17/2017   Procedure: Atrial Fibrillation Ablation;  Surgeon: [name], MD;  Location: [place] [facility];  Service: Cardiovascular;  Laterality: N/A;  . CARDIOVERSION N/A 09/06/2012   Procedure: CARDIOVERSION;  Surgeon: [name], MD;  Location: [place];  Service: Cardiovascular;  Laterality: N/A;  . CARDIOVERSION N/A 01/07/2013     Procedure: CARDIOVERSION;  Surgeon: [name], MD;  Location: [facility];  Service: Cardiovascular;  Laterality: N/A;  . CARDIOVERSION N/A 11/14/2013   Procedure: CARDIOVERSION;  Surgeon: [name], MD;  Location: [facility] ENDOSCOPY;  Service: Cardiovascular;  Laterality: N/A;  . CARDIOVERSION N/A 01/19/2017   Procedure: CARDIOVERSION;  Surgeon: [name], MD;  Location: [facility] ENDOSCOPY;  Service: Cardiovascular;  Laterality: N/A;  . COLONOSCOPY N/A 07/28/2014   Procedure: COLONOSCOPY;  Surgeon: Ladene Artist, MD;  Location: [facility] ENDOSCOPY;  Service: Endoscopy;  Laterality: N/A;  . LAPAROSCOPIC SIGMOID COLECTOMY N/A 08/08/2014   Procedure: LAPAROSCOPIC SIGMOID COLECTOMY Sylvie Farrier PROCTOSCOPY;  Surgeon: Excell Seltzer, MD;  Location: [facility];  Service: General;  Laterality: N/A;  . TEE WITHOUT CARDIOVERSION N/A 09/06/2012   Procedure: TRANSESOPHAGEAL ECHOCARDIOGRAM (TEE);  Surgeon: [name], MD;  Location: [facility] ENDOSCOPY;  Service: Cardiovascular;  Laterality: N/A;  . TEE WITHOUT CARDIOVERSION N/A 11/01/2012   Procedure: TRANSESOPHAGEAL ECHOCARDIOGRAM (TEE);  Surgeon: [name], MD;  Location: [facility] ENDOSCOPY;  Service: Cardiovascular;  Laterality: N/A;  . TUBAL LIGATION     bilateral    FAMILY HISTORY: Family History  Problem Relation Age of Onset  . Heart attack Mother   . Breast cancer Mother   . Diabetes type II Father   . Hypertension Father   . Throat cancer Brother   . Heart attack Paternal Grandmother   . Heart attack Maternal Grandfather   . Heart attack Maternal Grandmother     SOCIAL HISTORY:  Social History   Socioeconomic History  . Marital status: Widowed    Spouse name: Not on file  . Number of children: 4  . Years of education: 21  . Highest education level: Not on file  Occupational History    Comment: retired  Tobacco Use  . Smoking status: Former Smoker    Quit date: 07/21/1994    Years since quitting: 25.7  . Smokeless tobacco: Never Used   Vaping Use  . Vaping Use: Never used  Substance and Sexual Activity  . Alcohol use: No  . Drug use: No  . Sexual activity: Yes  Other Topics Concern  . Not on file  Social History Narrative   04/23/20 Lives with grand daughter,   Retired   Four children   Caffeine -none   high school   Social Determinants of Health   Financial Resource Strain:   . Difficulty of Paying Living Expenses: Not on file  Food Insecurity: Food Insecurity Present  . Worried About Charity fundraiser in the Last Year: Sometimes true  . Ran Out of Food in the Last Year: Sometimes true  Transportation Needs: No Transportation Needs  . Lack of Transportation (Medical): No  . Lack of Transportation (Non-Medical): No  Physical Activity:   . Days of Exercise per Week: Not on file  . Minutes of Exercise per Session: Not on file  Stress:   . Feeling of Stress : Not on file  Social Connections:   . Frequency of Communication with Friends and Family: Not on file  . Frequency of Social Gatherings with Friends and Family: Not on file  . Attends Religious Services: Not on file  . Active Member of Clubs or Organizations: Not on file  . Attends Archivist Meetings: Not on file  . Marital Status: Not on file  Intimate Partner Violence:   . Fear of Current or Ex-Partner: Not on file  . Emotionally Abused: Not on file  . Physically Abused: Not on file  . Sexually Abused: Not on file     PHYSICAL EXAM  GENERAL EXAM/CONSTITUTIONAL: Vitals:  Vitals:   04/23/20 1355  BP: (!) 164/73  Pulse: 73  Weight: 193 lb 12.8 oz (87.9 kg)  Height: 5\' 2"  (1.575 m)   Body mass index is 35.45 kg/m. No exam data present  Patient is in no distress; well developed, nourished and groomed; neck is supple  02 VIA [place]  CARDIOVASCULAR:  Examination of carotid arteries is normal; no carotid bruits  Regular rate and rhythm, no murmurs  Examination of peripheral vascular system by observation and palpation is  normal  EYES:  Ophthalmoscopic exam of optic discs  and posterior segments is normal; no papilledema or hemorrhages  MUSCULOSKELETAL:  Gait, strength, tone, movements noted in Neurologic exam below  NEUROLOGIC: MENTAL STATUS:  MMSE - [place] Exam 04/23/2020  Orientation to time 4  Orientation to Place 4  Registration 3  Attention/ Calculation 1  Recall 2  Language- name 2 objects 2  Language- repeat 1  Language- follow 3 step command 3  Language- read & follow direction 1  Write a sentence 1  Copy design 0  Total score 22    awake, alert, oriented to person, place and time  recent and remote memory intact  normal attention and concentration  language fluent, comprehension intact, naming intact,   fund of knowledge appropriate  CRANIAL NERVE:   2nd - no papilledema on fundoscopic exam  2nd, 3rd, 4th, 6th - pupils equal and reactive to light, visual fields full to confrontation, extraocular muscles intact, no nystagmus  5th - facial sensation symmetric  7th - facial strength symmetric  8th - hearing intact  9th - palate elevates symmetrically, uvula midline  11th - shoulder shrug symmetric  12th - tongue protrusion midline  MOTOR:   POSTURAL / ACTION TREMOR BUE  VERY SUBTLE RESTING TREMOR IN LUE  INT MILD MOUTH TREMOR  MODERATE BRADYKINESIA IN RUE > LUE; MILD BRADYKINESIA IN BLE  MILD COGWHEELING IN RUE > LUE  normal bulk and tone, full strength in the BUE, BLE  SENSORY:   normal and symmetric to light touch, temperature; DECR VIB AT TOES  COORDINATION:   finger-nose-finger, fine finger movements --> SLOW  REFLEXES:   deep tendon reflexes TRACE and symmetric  NEG SNOUT AND ROOTING REFLEXES  GAIT/STATION:   narrow based gait; STOOPED POSTURE; USING ROLLATOR WALKER    DIAGNOSTIC DATA (LABS, IMAGING, TESTING) - I reviewed patient records, labs, notes, testing and imaging myself where available.  Lab Results  Component  Value Date   WBC 9.5 03/05/2017   HGB 13.6 03/05/2017   HCT 40.0 03/05/2017   MCV 92 03/05/2017   PLT 374 03/05/2017      Component Value Date/Time   NA 140 03/20/2017 0257   NA 140 03/05/2017 1125   K 4.0 03/20/2017 0257   CL 100 (L) 03/20/2017 0257   CO2 33 (H) 03/20/2017 0257   GLUCOSE 187 (H) 03/20/2017 0257   BUN 20 03/20/2017 0257   BUN 23 03/05/2017 1125   CREATININE 1.21 (H) 03/20/2017 0257   CREATININE 1.03 01/06/2013 1210   CALCIUM 9.5 03/20/2017 0257   PROT 7.1 02/08/2015 1009   ALBUMIN 3.5 02/08/2015 1009   AST 18 02/08/2015 1009   ALT 13 (L) 02/08/2015 1009   ALKPHOS 60 02/08/2015 1009   BILITOT 0.3 02/08/2015 1009   GFRNONAA 43 (L) 03/20/2017 0257   GFRAA 50 (L) 03/20/2017 0257   Lab Results  Component Value Date   CHOL 153 05/02/2014   HDL 56 05/02/2014   LDLCALC 74 05/02/2014   TRIG 116 05/02/2014   CHOLHDL 2.7 05/02/2014   Lab Results  Component Value Date   HGBA1C 9.2 08/16/2018   No results found for: [id number] Lab Results  Component Value Date   TSH 0.291 (L) 08/30/2015       ASSESSMENT AND PLAN  74 y.o. year old female here with tremors since 2015.   Ddx: essential tremor, hyperthyroidism, stress / anxiety  1. Memory loss   2. Essential tremor   3. RLS (restless legs syndrome)      PLAN:  MILD  MEMORY LOSS (related to insomnia, depression, RA pain) - mild; monitor for now; supportive care - safety / supervision issues reviewed - daily physical activity / exercise (at least 15-30 minutes) - eat more plants / vegetables - increase social activities, brain stimulation, games, puzzles, hobbies, crafts, arts, music - aim for at least 7-8 hours sleep per night (or more) - avoid smoking and alcohol - caution with driving and finances  MILD TREMOR (likely essential tremor)  - monitor symptoms  RLS (mild) - monitor symptoms  Return for return to PCP, pending if symptoms worsen or fail to improve.    Penni Bombard,  MD 37/10/4512, 6:04 PM Certified in Neurology, Neurophysiology and Neuroimaging  [facility] [address], [place], [place] [address] [phone]

## 2020-04-23 NOTE — Patient Instructions (Signed)
  MILD MEMORY LOSS (related to insomnia, depression, RA pain) - mild; monitor for now; supportive care - safety / supervision issues reviewed - daily physical activity / exercise (at least 15-30 minutes) - eat more plants / vegetables - increase social activities, brain stimulation, games, puzzles, hobbies, crafts, arts, music - aim for at least 7-8 hours sleep per night (or more) - avoid smoking and alcohol - caution with driving and finances  MILD TREMOR (likely essential tremor)  - monitor symptoms  RLS (mild) - monitor symptoms

## 2020-04-29 ENCOUNTER — Other Ambulatory Visit (HOSPITAL_COMMUNITY): Payer: Self-pay | Admitting: Internal Medicine

## 2020-04-29 ENCOUNTER — Other Ambulatory Visit (HOSPITAL_COMMUNITY): Payer: Self-pay | Admitting: Nurse Practitioner

## 2020-04-30 NOTE — Telephone Encounter (Signed)
Pt last saw Roderic Palau, NP on 04/13/19, overdue for follow-up, was due to see Dr [name] in 6 mos per recall (due 09/2019).  Send staff meg to schedulers to contact pt for appt.

## 2020-05-02 DIAGNOSIS — E1165 Type 2 diabetes mellitus with hyperglycemia: Secondary | ICD-10-CM | POA: Diagnosis not present

## 2020-05-02 NOTE — Telephone Encounter (Signed)
Eliquis 5mg  refill request received. Patient is 74 years old, weight-87.9kg, Cre[place]-1.05 on 03/07/2020 via [place] at [place], [place], and last seen by Roderic Palau on 04/13/2019 and has an appt with Dr. [name] on 05/23/2020 at 945am. Dose is appropriate based on dosing criteria. Will send in a refill to requested pharmacy.

## 2020-05-03 DIAGNOSIS — E1165 Type 2 diabetes mellitus with hyperglycemia: Secondary | ICD-10-CM | POA: Diagnosis not present

## 2020-05-23 ENCOUNTER — Encounter: Payer: Self-pay | Admitting: Internal Medicine

## 2020-05-23 ENCOUNTER — Ambulatory Visit: Payer: HMO | Admitting: Internal Medicine

## 2020-05-23 ENCOUNTER — Other Ambulatory Visit: Payer: Self-pay

## 2020-05-23 VITALS — BP 132/60 | HR 88 | Ht 62.0 in | Wt 195.0 lb

## 2020-05-23 DIAGNOSIS — I10 Essential (primary) hypertension: Secondary | ICD-10-CM | POA: Diagnosis not present

## 2020-05-23 DIAGNOSIS — I4819 Other persistent atrial fibrillation: Secondary | ICD-10-CM | POA: Diagnosis not present

## 2020-05-23 NOTE — Patient Instructions (Addendum)
Medication Instructions:  Your physician recommends that you continue on your current medications as directed. Please refer to the Current Medication list given to you today.  *If you need a refill on your cardiac medications before your next appointment, please call your pharmacy*  Lab Work: None ordered.  If you have labs (blood work) drawn today and your tests are completely normal, you will receive your results only by: Marland Kitchen MyChart Message (if you have MyChart) OR . A paper copy in the mail If you have any lab test that is abnormal or we need to change your treatment, we will call you to review the results.  Testing/Procedures: None ordered.  Follow-Up: At [facility], you and your health needs are our priority.  As part of our continuing mission to provide you with exceptional heart care, we have created designated Provider Care Teams.  These Care Teams include your primary Cardiologist (physician) and Advanced Practice Providers (APPs -  Physician Assistants and Nurse Practitioners) who all work together to provide you with the care you need, when you need it.  We recommend signing up for the patient portal called "MyChart".  Sign up information is provided on this After Visit Summary.  MyChart is used to connect with patients for Virtual Visits (Telemedicine).  Patients are able to view lab/test results, encounter notes, upcoming appointments, etc.  Non-urgent messages can be sent to your provider as well.   To learn more about what you can do with MyChart, go to [email or web address].    Your next appointment:   Your physician wants you to follow-up in: 1 year with Dr. [name]. You will receive a reminder letter in the mail two months in advance. If you don't receive a letter, please call our office to schedule the follow-up appointment. 6 months with the Afib clinic, they will reach out to you to schedule   Other Instructions:

## 2020-05-23 NOTE — Progress Notes (Signed)
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 female who presents today for routine electrophysiology followup.  Since last being seen in our clinic, the patient reports doing very well.  Today, she denies symptoms of palpitations, chest pain, shortness of breath,  lower extremity edema, dizziness, presyncope, or syncope.  The patient is otherwise without complaint today.   Past Medical History:  Diagnosis Date  . Anemia   . Arthritis    rheumatoid  . Atrial fibrillation ([place])   . CKD (chronic kidney disease)   . Colon adenoma   . Depression   . Diabetes mellitus without complication ([place])   . Diastolic heart failure ([place])   . Difficulty sleeping   . GERD (gastroesophageal reflux disease)   . Headache    migraines  . History of blood transfusion   . HTN (hypertension)   . Hyperlipidemia   . Insomnia   . LV dysfunction, EF 45-50% due to a. fib per echo 08/27/12  08/28/2012   a. Echo (10/15):  EF 55-60%, no RWMA, Gr 2 DD, mild MR, mod LAE, normal RVF, mild RAE, mild TR (LA 44 mm)  . Memory difficulty   . Neuropathy   . Persistent atrial fibrillation ([place])   . Rheumatoid arthritis ([place]) 10/11/2018  . RLS (restless legs syndrome)   . Torsades de pointes [facility])    due to [facility] 04/2014   Past Surgical History:  Procedure Laterality Date  . ATRIAL FIBRILLATION ABLATION N/A 11/02/2012   PVI by Dr [name]  . ATRIAL FIBRILLATION ABLATION N/A 03/17/2017   Procedure: Atrial Fibrillation Ablation;  Surgeon: [name], MD;  Location: [place] [facility];  Service: Cardiovascular;  Laterality: N/A;  . CARDIOVERSION N/A 09/06/2012   Procedure: CARDIOVERSION;  Surgeon: [name], MD;  Location: [place];  Service: Cardiovascular;  Laterality: N/A;  . CARDIOVERSION N/A 01/07/2013   Procedure: CARDIOVERSION;  Surgeon: [name], MD;  Location: [place] ENDOSCOPY;  Service: Cardiovascular;  Laterality: N/A;  . CARDIOVERSION N/A 11/14/2013   Procedure:  CARDIOVERSION;  Surgeon: [name], MD;  Location: [facility] ENDOSCOPY;  Service: Cardiovascular;  Laterality: N/A;  . CARDIOVERSION N/A 01/19/2017   Procedure: CARDIOVERSION;  Surgeon: [name], MD;  Location: [facility] ENDOSCOPY;  Service: Cardiovascular;  Laterality: N/A;  . COLONOSCOPY N/A 07/28/2014   Procedure: COLONOSCOPY;  Surgeon: Ladene Artist, MD;  Location: [facility] ENDOSCOPY;  Service: Endoscopy;  Laterality: N/A;  . LAPAROSCOPIC SIGMOID COLECTOMY N/A 08/08/2014   Procedure: LAPAROSCOPIC SIGMOID COLECTOMY Sylvie Farrier PROCTOSCOPY;  Surgeon: Excell Seltzer, MD;  Location: [facility];  Service: General;  Laterality: N/A;  . TEE WITHOUT CARDIOVERSION N/A 09/06/2012   Procedure: TRANSESOPHAGEAL ECHOCARDIOGRAM (TEE);  Surgeon: [name], MD;  Location: [facility] ENDOSCOPY;  Service: Cardiovascular;  Laterality: N/A;  . TEE WITHOUT CARDIOVERSION N/A 11/01/2012   Procedure: TRANSESOPHAGEAL ECHOCARDIOGRAM (TEE);  Surgeon: [name], MD;  Location: [facility] ENDOSCOPY;  Service: Cardiovascular;  Laterality: N/A;  . TUBAL LIGATION     bilateral    ROS- all systems are reviewed and negatives except as per HPI above  Current Outpatient Medications  Medication Sig Dispense Refill  . amitriptyline (ELAVIL) 25 MG tablet Take 75 mg by mouth at bedtime.   3  . atorvastatin (LIPITOR) 10 MG tablet Take 10 mg by mouth at bedtime.     . cetirizine (ZYRTEC) 10 MG tablet Take 10 mg by mouth daily.    . Cholecalciferol (VITAMIN D3) 2000  units TABS Take 2,000 Units by mouth daily.    . Continuous Blood Gluc Receiver (FREESTYLE LIBRE 14 DAY READER) DEVI by Does not apply route.    . Continuous Blood Gluc Sensor (FREESTYLE LIBRE 14 DAY SENSOR) MISC Use to test blood sugars    . cyanocobalamin (CVS VITAMIN B12) 2000 MCG tablet Take 2,000 mcg by mouth daily.     Marland Kitchen diltiazem (CARDIZEM CD) 120 MG 24 hr capsule TAKE 1 CAPSULE BY MOUTH EVERY DAY. APPT NEED FOR FURTHER REFILLS. 30 capsule 0  . ELIQUIS 5 MG TABS tablet TAKE 1  TABLET BY MOUTH TWICE DAILY 60 tablet 0  . famotidine (PEPCID) 20 MG tablet Take 20 mg by mouth 2 (two) times daily.    . folic acid (FOLVITE) 1 MG tablet Take 1 mg by mouth daily.    . furosemide (LASIX) 40 MG tablet Take 1 tablet (40 mg total) by mouth daily. 30 tablet 6  . gabapentin (NEURONTIN) 300 MG capsule Take 300 mg by mouth 3 (three) times daily as needed (for pain.).     Marland Kitchen HUMIRA PEN 40 MG/0.8ML PNKT Inject 40 mg into the skin every 14 (fourteen) days.     . Insulin Syringe-Needle U-100 (INSULIN SYRINGE 1CC/31GX5/16") 31G X 5/16" 1 ML MISC 1 each by Other route 3 (three) times daily.   0  . methimazole (TAPAZOLE) 10 MG tablet TK 1 T PO QD WF  3  . Multiple Vitamin (MULTIVITAMIN WITH MINERALS) TABS Take 1 tablet by mouth daily.    Marland Kitchen NOVOLIN 70/30 RELION (70-30) 100 UNIT/ML injection 70 units in the morning and 30 units at lunch; 30 units after dinner    . potassium chloride SA (K-DUR,KLOR-CON) 20 MEQ tablet Take 1 tablet (20 mEq total) by mouth daily. 30 tablet 6  . sertraline (ZOLOFT) 100 MG tablet Take 150 mg by mouth every evening.   1  . traZODone (DESYREL) 50 MG tablet Take 100 mg by mouth at bedtime.      No current facility-administered medications for this visit.    Physical Exam: Vitals:   05/23/20 1036  BP: 132/60  Pulse: 88  SpO2: 97%  Weight: 195 lb (88.5 kg)  Height: 5\' 2"  (1.575 m)    GEN- The patient is well appearing, alert and oriented x 3 today.   Head- normocephalic, atraumatic Eyes-  Sclera clear, conjunctiva pink Ears- hearing intact Oropharynx- clear Lungs- Clear to ausculation bilaterally, normal work of breathing Heart- Regular rate and rhythm, no murmurs, rubs or gallops, PMI not laterally displaced GI- soft, NT, ND, + BS Extremities- no clubbing, cyanosis, or edema  Wt Readings from Last 3 Encounters:  05/23/20 195 lb (88.5 kg)  04/23/20 193 lb 12.8 oz (87.9 kg)  04/13/19 206 lb 6.4 oz (93.6 kg)    EKG tracing ordered today is personally  reviewed and shows sinus with PR 208 msec, RBBB  Assessment and Plan:  1. Persistent afib Doing well post ablation chads2vasc score is 4.  He is on eliquis  2. HTN Stable No change required today  3. Obesity Body mass index is 35.67 kg/m. Lifestyle modification is advised  Risks, benefits and potential toxicities for medications prescribed and/or refilled reviewed with patient today.   Return to AF clinic in 6 months I will see in a year  [name] MD, [facility] 05/23/2020 11:28 AM

## 2020-05-29 ENCOUNTER — Other Ambulatory Visit (HOSPITAL_COMMUNITY): Payer: Self-pay | Admitting: Internal Medicine

## 2020-05-29 NOTE — Telephone Encounter (Signed)
Pt last saw Dr [name] 05/23/20, last labs 03/07/20 Creat 1.05 at [place] per [place], age 74, weight 88.5kg, based on specified criteria pt is on appropriate dosage of Eliquis 5mg  BID.  Will refill rx.

## 2020-06-03 DIAGNOSIS — E1165 Type 2 diabetes mellitus with hyperglycemia: Secondary | ICD-10-CM | POA: Diagnosis not present

## 2020-06-19 ENCOUNTER — Other Ambulatory Visit: Payer: Self-pay

## 2020-06-19 NOTE — Patient Outreach (Signed)
  [facility]) Care Management Chronic Special Needs Program    06/19/2020  Name: Alison Bell, DOB: November 04, 1945  MRN: [id number]   Alison Bell is enrolled in a chronic special needs plan for Diabetes. Called client to update care plan as she has re-enrolled in the chronic special needs plan. Client states that she is unable to talk at this time and requests RNCM call back in a few days RNCM will attempt to call in 2-3 days .  [name] RN, Jackquline Denmark, CDE Chronic Care Management Coordinator [facility] Management [phone]

## 2020-06-22 ENCOUNTER — Other Ambulatory Visit: Payer: Self-pay

## 2020-06-22 NOTE — Patient Outreach (Signed)
[facility]) Care Management Chronic Special Needs Program  06/22/2020  Name: Alison Bell DOB: Apr 26, 1946  MRN: [id number]  Alison Bell is enrolled in a chronic special needs plan for Diabetes. Reviewed and updated/reactivated care plan. Client has re-enrolled in chronic special needs plan.  Subjective: Client states that she has been doing good.  States that she saw Dr. [name] a few months ago and her Hemoglobin A1C was 7 something.  States that she cut out her lunch time dose of insulin and increased her dinner dose.  States her morning sugars are around 120 and her evening readings are 180-200.  States she has not had a low reading in about 2 months. States she is trying to eat healthy.  States she is affording food at this time. States her B/P is good when it is checked.  States her rheumatoid  arthritis has been stable.  Denies any problems with her atrial fibrillation and she is taking her Eliquis as ordered. Denies any issues affording her medications.   Denies any falls.  States she has the Advanced Directives forms but has not completed them.  States she has not gotten her flu shot yet but plans on getting one.  States she will not get the COVID shot.  States her granddaughter is living with her and she helps her with housework, shopping and appointments.    Goals Addressed              This Visit's Progress   .  COMPLETED:  Acknowledge receipt of Advanced Directive package   On track     Received packet Reinforced importance of completing Advanced Directives   Goal completed 06/22/20    .  Client will report no worsening of symptoms of Atrial Fibrillation within the next 9 months(continue 02/07/20)   On track     No reports of worsening of atrial fibrillation Plan to take anticoagulant as ordered to prevent stroke    .  Client will verbalize knowledge of self management of Hypertension as evidences by BP reading of 140/90 or less; or as defined by provider   On  track     Reports B/P below 140/90 when she checks at home and at providers Plan to check B/P regularly Take B/P medications as ordered Plan to follow a low salt diet     .  HEMOGLOBIN A1C < 7 (pt-stated)        Last Hemoglobin A1C 7.8% on 01/12/20 Scan or check blood sugars before meals  with goal of 80-130 fasting and 180 or less after meals Reinforced to follow a low carbohydrate low salt diet and to watch portion sizes Reviewed use and possible side effects of insulin Reviewed signs and symptoms of hypoglycemia and actions to take Plan to eat a snack at bedtime with protein such as peanut butter, cheese or nuts with cracker or fruit Please consider getting COVID vaccination as you are at high risk  Plan to get your annual Flu shot    .  COMPLETED: Obtain annual  Lipid Profile, LDL-C   On track     Last results 03/07/20 LDL 91 The goal for LDL is less than 70 mg/dL as you are at high risk for complications Try to avoid saturated fats, trans-fats and eat more fiber Plan to take statin as ordered  Goal completed 06/22/20     .  Obtain Annual Eye (retinal)  Exam    Not on track     Reinforced  importance of having annual eye exam Given number for her provider [facility] to call and schedule visit [phone]    .  COMPLETED: Obtain Annual Foot Exam        Reports completed by [facility] on 01/12/20 Check your skin and feet every day for cuts, bruises, redness, blisters, or sores. Schedule a foot exam with your health care provider once every year Goal completed 02/07/20    .  Obtain annual screen for micro albuminuria (urine) , nephropathy (kidney problems)   On track     It is important for your doctor to check your urine for protein at least every year    .  COMPLETED: Obtain Hemoglobin A1C at least 2 times per year   On track     Checked 10/26/19, 01/12/20 It is important to have your Hemoglobin A1C checked every 6 months if you are at goal and every 3 months if you are not at goal Goal  completed 06/22/20    .  COMPLETED: Visit Primary Care Provider or Endocrinologist at least 2 times per year    On track     Primary care provider 03/14/20 Annual Wellness visit 03/14/20 Endocrinologist  10/26/19, 02/27/20 [facility] provider 01/12/20 next scheduled visit 07/23/20 Please schedule your annual wellness visit Goal completed 06/22/20       Plan:  Send successful outreach letter with a copy of their individualized care plan, Send individual care plan to provider   [facility] will continue to provide services for this client through 07/20/2020. The [facility] care management team will assume care 07/21/2020.  Client will be outreached by a [facility]) RNCM in 6 months per tier level    [place], [facility], [place] Management [phone]

## 2020-07-03 DIAGNOSIS — E1165 Type 2 diabetes mellitus with hyperglycemia: Secondary | ICD-10-CM | POA: Diagnosis not present

## 2020-07-06 ENCOUNTER — Other Ambulatory Visit: Payer: Self-pay

## 2020-07-06 NOTE — Patient Outreach (Signed)
  [name]) Care Management Chronic Special Needs Program    07/06/2020  Name: Alison Bell, DOB: 01-Feb-1946  MRN: [id number]   Ms. Alison Bell is enrolled in a chronic special needs plan for Diabetes.  [place] Management will continue to provide services for this client through 07/20/2020. The [facility] care management team will assume care 07/21/2020.  [name] RN, Jackquline Denmark, CDE Chronic Care Management Coordinator [facility] Management [phone]

## 2020-07-27 ENCOUNTER — Other Ambulatory Visit: Payer: Self-pay

## 2020-08-03 DIAGNOSIS — E1165 Type 2 diabetes mellitus with hyperglycemia: Secondary | ICD-10-CM | POA: Diagnosis not present

## 2020-09-03 DIAGNOSIS — E1165 Type 2 diabetes mellitus with hyperglycemia: Secondary | ICD-10-CM | POA: Diagnosis not present

## 2020-09-07 DIAGNOSIS — E1122 Type 2 diabetes mellitus with diabetic chronic kidney disease: Secondary | ICD-10-CM | POA: Diagnosis not present

## 2020-09-07 DIAGNOSIS — E0590 Thyrotoxicosis, unspecified without thyrotoxic crisis or storm: Secondary | ICD-10-CM | POA: Diagnosis not present

## 2020-09-07 DIAGNOSIS — E1165 Type 2 diabetes mellitus with hyperglycemia: Secondary | ICD-10-CM | POA: Diagnosis not present

## 2020-09-12 DIAGNOSIS — I10 Essential (primary) hypertension: Secondary | ICD-10-CM | POA: Diagnosis not present

## 2020-09-12 DIAGNOSIS — E7800 Pure hypercholesterolemia, unspecified: Secondary | ICD-10-CM | POA: Diagnosis not present

## 2020-09-12 DIAGNOSIS — E0590 Thyrotoxicosis, unspecified without thyrotoxic crisis or storm: Secondary | ICD-10-CM | POA: Diagnosis not present

## 2020-09-12 DIAGNOSIS — E1122 Type 2 diabetes mellitus with diabetic chronic kidney disease: Secondary | ICD-10-CM | POA: Diagnosis not present

## 2020-10-01 DIAGNOSIS — E1165 Type 2 diabetes mellitus with hyperglycemia: Secondary | ICD-10-CM | POA: Diagnosis not present

## 2020-10-16 DIAGNOSIS — E782 Mixed hyperlipidemia: Secondary | ICD-10-CM | POA: Diagnosis not present

## 2020-10-16 DIAGNOSIS — E1122 Type 2 diabetes mellitus with diabetic chronic kidney disease: Secondary | ICD-10-CM | POA: Diagnosis not present

## 2020-10-16 DIAGNOSIS — E0590 Thyrotoxicosis, unspecified without thyrotoxic crisis or storm: Secondary | ICD-10-CM | POA: Diagnosis not present

## 2020-10-16 DIAGNOSIS — I10 Essential (primary) hypertension: Secondary | ICD-10-CM | POA: Diagnosis not present

## 2020-11-01 DIAGNOSIS — E1165 Type 2 diabetes mellitus with hyperglycemia: Secondary | ICD-10-CM | POA: Diagnosis not present

## 2020-12-01 DIAGNOSIS — E1165 Type 2 diabetes mellitus with hyperglycemia: Secondary | ICD-10-CM | POA: Diagnosis not present

## 2021-01-01 DIAGNOSIS — E1165 Type 2 diabetes mellitus with hyperglycemia: Secondary | ICD-10-CM | POA: Diagnosis not present

## 2021-01-31 DIAGNOSIS — E1165 Type 2 diabetes mellitus with hyperglycemia: Secondary | ICD-10-CM | POA: Diagnosis not present

## 2021-03-03 DIAGNOSIS — E1165 Type 2 diabetes mellitus with hyperglycemia: Secondary | ICD-10-CM | POA: Diagnosis not present

## 2021-03-06 ENCOUNTER — Other Ambulatory Visit: Payer: Self-pay

## 2021-03-06 MED ORDER — APIXABAN 5 MG PO TABS: 5.0000 mg | ORAL_TABLET | Freq: Two times a day (BID) | ORAL | 5 refills | Status: AC

## 2021-03-06 NOTE — Telephone Encounter (Signed)
Faxed refill request received from [facility] for Alison Bell rx.  Pt last saw Dr [name] 05/23/20, last labs 09/07/20 Creat 1.18 at [place] per [place], age 75, weight 88.5kg, based on specified criteria pt is on appropriate dosage of Alison Bell '5mg'$  BID.  Will refill rx.

## 2021-04-03 DIAGNOSIS — E1165 Type 2 diabetes mellitus with hyperglycemia: Secondary | ICD-10-CM | POA: Diagnosis not present

## 2021-04-19 DIAGNOSIS — I5030 Unspecified diastolic (congestive) heart failure: Secondary | ICD-10-CM | POA: Diagnosis not present

## 2021-04-19 DIAGNOSIS — N1831 Chronic kidney disease, stage 3a: Secondary | ICD-10-CM | POA: Diagnosis not present

## 2021-04-19 DIAGNOSIS — E1122 Type 2 diabetes mellitus with diabetic chronic kidney disease: Secondary | ICD-10-CM | POA: Diagnosis not present

## 2021-04-19 DIAGNOSIS — I130 Hypertensive heart and chronic kidney disease with heart failure and stage 1 through stage 4 chronic kidney disease, or unspecified chronic kidney disease: Secondary | ICD-10-CM | POA: Diagnosis not present

## 2021-05-03 DIAGNOSIS — E1165 Type 2 diabetes mellitus with hyperglycemia: Secondary | ICD-10-CM | POA: Diagnosis not present

## 2021-05-10 DIAGNOSIS — Z7901 Long term (current) use of anticoagulants: Secondary | ICD-10-CM | POA: Diagnosis not present

## 2021-05-10 DIAGNOSIS — Z23 Encounter for immunization: Secondary | ICD-10-CM | POA: Diagnosis not present

## 2021-05-10 DIAGNOSIS — E782 Mixed hyperlipidemia: Secondary | ICD-10-CM | POA: Diagnosis not present

## 2021-05-10 DIAGNOSIS — E113553 Type 2 diabetes mellitus with stable proliferative diabetic retinopathy, bilateral: Secondary | ICD-10-CM | POA: Diagnosis not present

## 2021-05-10 DIAGNOSIS — M0579 Rheumatoid arthritis with rheumatoid factor of multiple sites without organ or systems involvement: Secondary | ICD-10-CM | POA: Diagnosis not present

## 2021-05-10 DIAGNOSIS — I4891 Unspecified atrial fibrillation: Secondary | ICD-10-CM | POA: Diagnosis not present

## 2021-05-10 DIAGNOSIS — E1165 Type 2 diabetes mellitus with hyperglycemia: Secondary | ICD-10-CM | POA: Diagnosis not present

## 2021-05-10 DIAGNOSIS — F331 Major depressive disorder, recurrent, moderate: Secondary | ICD-10-CM | POA: Diagnosis not present

## 2021-05-10 DIAGNOSIS — N1831 Chronic kidney disease, stage 3a: Secondary | ICD-10-CM | POA: Diagnosis not present

## 2021-05-10 DIAGNOSIS — E559 Vitamin D deficiency, unspecified: Secondary | ICD-10-CM | POA: Diagnosis not present

## 2021-05-10 DIAGNOSIS — Z0000 Encounter for general adult medical examination without abnormal findings: Secondary | ICD-10-CM | POA: Diagnosis not present

## 2021-05-20 DIAGNOSIS — E1122 Type 2 diabetes mellitus with diabetic chronic kidney disease: Secondary | ICD-10-CM | POA: Diagnosis not present

## 2021-05-20 DIAGNOSIS — I130 Hypertensive heart and chronic kidney disease with heart failure and stage 1 through stage 4 chronic kidney disease, or unspecified chronic kidney disease: Secondary | ICD-10-CM | POA: Diagnosis not present

## 2021-05-20 DIAGNOSIS — I5030 Unspecified diastolic (congestive) heart failure: Secondary | ICD-10-CM | POA: Diagnosis not present

## 2021-05-20 DIAGNOSIS — N1831 Chronic kidney disease, stage 3a: Secondary | ICD-10-CM | POA: Diagnosis not present

## 2021-05-21 DIAGNOSIS — E782 Mixed hyperlipidemia: Secondary | ICD-10-CM | POA: Diagnosis not present

## 2021-05-21 DIAGNOSIS — E1122 Type 2 diabetes mellitus with diabetic chronic kidney disease: Secondary | ICD-10-CM | POA: Diagnosis not present

## 2021-05-21 DIAGNOSIS — E0590 Thyrotoxicosis, unspecified without thyrotoxic crisis or storm: Secondary | ICD-10-CM | POA: Diagnosis not present

## 2021-05-21 DIAGNOSIS — G609 Hereditary and idiopathic neuropathy, unspecified: Secondary | ICD-10-CM | POA: Diagnosis not present

## 2021-05-21 DIAGNOSIS — I10 Essential (primary) hypertension: Secondary | ICD-10-CM | POA: Diagnosis not present

## 2021-06-03 DIAGNOSIS — E1165 Type 2 diabetes mellitus with hyperglycemia: Secondary | ICD-10-CM | POA: Diagnosis not present

## 2021-06-19 DIAGNOSIS — I130 Hypertensive heart and chronic kidney disease with heart failure and stage 1 through stage 4 chronic kidney disease, or unspecified chronic kidney disease: Secondary | ICD-10-CM | POA: Diagnosis not present

## 2021-06-19 DIAGNOSIS — N1831 Chronic kidney disease, stage 3a: Secondary | ICD-10-CM | POA: Diagnosis not present

## 2021-06-19 DIAGNOSIS — E1122 Type 2 diabetes mellitus with diabetic chronic kidney disease: Secondary | ICD-10-CM | POA: Diagnosis not present

## 2021-06-19 DIAGNOSIS — I5030 Unspecified diastolic (congestive) heart failure: Secondary | ICD-10-CM | POA: Diagnosis not present

## 2021-07-19 DIAGNOSIS — E1122 Type 2 diabetes mellitus with diabetic chronic kidney disease: Secondary | ICD-10-CM | POA: Diagnosis not present

## 2021-07-19 DIAGNOSIS — I5030 Unspecified diastolic (congestive) heart failure: Secondary | ICD-10-CM | POA: Diagnosis not present

## 2021-07-19 DIAGNOSIS — N1831 Chronic kidney disease, stage 3a: Secondary | ICD-10-CM | POA: Diagnosis not present

## 2021-07-19 DIAGNOSIS — I130 Hypertensive heart and chronic kidney disease with heart failure and stage 1 through stage 4 chronic kidney disease, or unspecified chronic kidney disease: Secondary | ICD-10-CM | POA: Diagnosis not present

## 2021-07-24 DIAGNOSIS — I10 Essential (primary) hypertension: Secondary | ICD-10-CM | POA: Diagnosis not present

## 2021-07-24 DIAGNOSIS — E1122 Type 2 diabetes mellitus with diabetic chronic kidney disease: Secondary | ICD-10-CM | POA: Diagnosis not present

## 2021-07-24 DIAGNOSIS — E0590 Thyrotoxicosis, unspecified without thyrotoxic crisis or storm: Secondary | ICD-10-CM | POA: Diagnosis not present

## 2021-07-24 DIAGNOSIS — E782 Mixed hyperlipidemia: Secondary | ICD-10-CM | POA: Diagnosis not present

## 2021-07-24 DIAGNOSIS — E1165 Type 2 diabetes mellitus with hyperglycemia: Secondary | ICD-10-CM | POA: Diagnosis not present

## 2021-07-29 DIAGNOSIS — D6869 Other thrombophilia: Secondary | ICD-10-CM | POA: Diagnosis not present

## 2021-07-29 DIAGNOSIS — Z7901 Long term (current) use of anticoagulants: Secondary | ICD-10-CM | POA: Diagnosis not present

## 2021-07-29 DIAGNOSIS — F3342 Major depressive disorder, recurrent, in full remission: Secondary | ICD-10-CM | POA: Diagnosis not present

## 2021-07-29 DIAGNOSIS — N183 Chronic kidney disease, stage 3 unspecified: Secondary | ICD-10-CM | POA: Diagnosis not present

## 2021-07-29 DIAGNOSIS — I480 Paroxysmal atrial fibrillation: Secondary | ICD-10-CM | POA: Diagnosis not present

## 2021-07-29 DIAGNOSIS — Z794 Long term (current) use of insulin: Secondary | ICD-10-CM | POA: Diagnosis not present

## 2021-07-29 DIAGNOSIS — M06041 Rheumatoid arthritis without rheumatoid factor, right hand: Secondary | ICD-10-CM | POA: Diagnosis not present

## 2021-07-29 DIAGNOSIS — Z7984 Long term (current) use of oral hypoglycemic drugs: Secondary | ICD-10-CM | POA: Diagnosis not present

## 2021-07-29 DIAGNOSIS — E1122 Type 2 diabetes mellitus with diabetic chronic kidney disease: Secondary | ICD-10-CM | POA: Diagnosis not present

## 2021-07-29 DIAGNOSIS — M06042 Rheumatoid arthritis without rheumatoid factor, left hand: Secondary | ICD-10-CM | POA: Diagnosis not present

## 2021-08-09 DIAGNOSIS — E1122 Type 2 diabetes mellitus with diabetic chronic kidney disease: Secondary | ICD-10-CM | POA: Diagnosis not present

## 2021-08-20 DIAGNOSIS — I130 Hypertensive heart and chronic kidney disease with heart failure and stage 1 through stage 4 chronic kidney disease, or unspecified chronic kidney disease: Secondary | ICD-10-CM | POA: Diagnosis not present

## 2021-08-20 DIAGNOSIS — I5030 Unspecified diastolic (congestive) heart failure: Secondary | ICD-10-CM | POA: Diagnosis not present

## 2021-08-20 DIAGNOSIS — E1122 Type 2 diabetes mellitus with diabetic chronic kidney disease: Secondary | ICD-10-CM | POA: Diagnosis not present

## 2021-08-20 DIAGNOSIS — N1831 Chronic kidney disease, stage 3a: Secondary | ICD-10-CM | POA: Diagnosis not present

## 2021-09-17 DIAGNOSIS — I5030 Unspecified diastolic (congestive) heart failure: Secondary | ICD-10-CM | POA: Diagnosis not present

## 2021-09-17 DIAGNOSIS — E1122 Type 2 diabetes mellitus with diabetic chronic kidney disease: Secondary | ICD-10-CM | POA: Diagnosis not present

## 2021-09-17 DIAGNOSIS — N1831 Chronic kidney disease, stage 3a: Secondary | ICD-10-CM | POA: Diagnosis not present

## 2021-09-17 DIAGNOSIS — I130 Hypertensive heart and chronic kidney disease with heart failure and stage 1 through stage 4 chronic kidney disease, or unspecified chronic kidney disease: Secondary | ICD-10-CM | POA: Diagnosis not present

## 2022-01-17 DIAGNOSIS — E1122 Type 2 diabetes mellitus with diabetic chronic kidney disease: Secondary | ICD-10-CM | POA: Diagnosis not present

## 2022-01-17 DIAGNOSIS — N1831 Chronic kidney disease, stage 3a: Secondary | ICD-10-CM | POA: Diagnosis not present

## 2022-01-17 DIAGNOSIS — I130 Hypertensive heart and chronic kidney disease with heart failure and stage 1 through stage 4 chronic kidney disease, or unspecified chronic kidney disease: Secondary | ICD-10-CM | POA: Diagnosis not present

## 2022-01-17 DIAGNOSIS — I5030 Unspecified diastolic (congestive) heart failure: Secondary | ICD-10-CM | POA: Diagnosis not present

## 2022-03-07 DIAGNOSIS — E559 Vitamin D deficiency, unspecified: Secondary | ICD-10-CM | POA: Diagnosis not present

## 2022-03-07 DIAGNOSIS — E1165 Type 2 diabetes mellitus with hyperglycemia: Secondary | ICD-10-CM | POA: Diagnosis not present

## 2022-03-07 DIAGNOSIS — E782 Mixed hyperlipidemia: Secondary | ICD-10-CM | POA: Diagnosis not present

## 2022-03-17 DIAGNOSIS — E0590 Thyrotoxicosis, unspecified without thyrotoxic crisis or storm: Secondary | ICD-10-CM | POA: Diagnosis not present

## 2022-03-17 DIAGNOSIS — E559 Vitamin D deficiency, unspecified: Secondary | ICD-10-CM | POA: Diagnosis not present

## 2022-03-17 DIAGNOSIS — I10 Essential (primary) hypertension: Secondary | ICD-10-CM | POA: Diagnosis not present

## 2022-03-17 DIAGNOSIS — E11311 Type 2 diabetes mellitus with unspecified diabetic retinopathy with macular edema: Secondary | ICD-10-CM | POA: Diagnosis not present

## 2022-03-17 DIAGNOSIS — E11319 Type 2 diabetes mellitus with unspecified diabetic retinopathy without macular edema: Secondary | ICD-10-CM | POA: Diagnosis not present

## 2022-03-17 DIAGNOSIS — E1122 Type 2 diabetes mellitus with diabetic chronic kidney disease: Secondary | ICD-10-CM | POA: Diagnosis not present

## 2022-04-14 DIAGNOSIS — E7800 Pure hypercholesterolemia, unspecified: Secondary | ICD-10-CM | POA: Diagnosis not present

## 2022-04-14 DIAGNOSIS — N1831 Chronic kidney disease, stage 3a: Secondary | ICD-10-CM | POA: Diagnosis not present

## 2022-04-14 DIAGNOSIS — E782 Mixed hyperlipidemia: Secondary | ICD-10-CM | POA: Diagnosis not present

## 2022-04-14 DIAGNOSIS — I10 Essential (primary) hypertension: Secondary | ICD-10-CM | POA: Diagnosis not present

## 2022-04-14 DIAGNOSIS — E1122 Type 2 diabetes mellitus with diabetic chronic kidney disease: Secondary | ICD-10-CM | POA: Diagnosis not present

## 2022-04-14 DIAGNOSIS — E1165 Type 2 diabetes mellitus with hyperglycemia: Secondary | ICD-10-CM | POA: Diagnosis not present

## 2022-04-14 DIAGNOSIS — E0590 Thyrotoxicosis, unspecified without thyrotoxic crisis or storm: Secondary | ICD-10-CM | POA: Diagnosis not present

## 2022-05-30 DIAGNOSIS — E11311 Type 2 diabetes mellitus with unspecified diabetic retinopathy with macular edema: Secondary | ICD-10-CM | POA: Diagnosis not present

## 2022-07-11 DIAGNOSIS — E1165 Type 2 diabetes mellitus with hyperglycemia: Secondary | ICD-10-CM | POA: Diagnosis not present

## 2022-07-11 DIAGNOSIS — I10 Essential (primary) hypertension: Secondary | ICD-10-CM | POA: Diagnosis not present

## 2022-07-11 DIAGNOSIS — E1122 Type 2 diabetes mellitus with diabetic chronic kidney disease: Secondary | ICD-10-CM | POA: Diagnosis not present

## 2022-07-11 DIAGNOSIS — M0579 Rheumatoid arthritis with rheumatoid factor of multiple sites without organ or systems involvement: Secondary | ICD-10-CM | POA: Diagnosis not present

## 2022-07-11 DIAGNOSIS — Z23 Encounter for immunization: Secondary | ICD-10-CM | POA: Diagnosis not present

## 2022-07-11 DIAGNOSIS — E782 Mixed hyperlipidemia: Secondary | ICD-10-CM | POA: Diagnosis not present

## 2022-07-11 DIAGNOSIS — E0590 Thyrotoxicosis, unspecified without thyrotoxic crisis or storm: Secondary | ICD-10-CM | POA: Diagnosis not present

## 2022-07-11 DIAGNOSIS — E11319 Type 2 diabetes mellitus with unspecified diabetic retinopathy without macular edema: Secondary | ICD-10-CM | POA: Diagnosis not present

## 2022-07-20 DIAGNOSIS — I130 Hypertensive heart and chronic kidney disease with heart failure and stage 1 through stage 4 chronic kidney disease, or unspecified chronic kidney disease: Secondary | ICD-10-CM | POA: Diagnosis not present

## 2022-07-20 DIAGNOSIS — I5030 Unspecified diastolic (congestive) heart failure: Secondary | ICD-10-CM | POA: Diagnosis not present

## 2022-07-20 DIAGNOSIS — N1831 Chronic kidney disease, stage 3a: Secondary | ICD-10-CM | POA: Diagnosis not present

## 2022-07-20 DIAGNOSIS — E1122 Type 2 diabetes mellitus with diabetic chronic kidney disease: Secondary | ICD-10-CM | POA: Diagnosis not present

## 2022-07-23 DIAGNOSIS — M0579 Rheumatoid arthritis with rheumatoid factor of multiple sites without organ or systems involvement: Secondary | ICD-10-CM | POA: Diagnosis not present

## 2022-07-23 DIAGNOSIS — E559 Vitamin D deficiency, unspecified: Secondary | ICD-10-CM | POA: Diagnosis not present

## 2022-07-23 DIAGNOSIS — E1122 Type 2 diabetes mellitus with diabetic chronic kidney disease: Secondary | ICD-10-CM | POA: Diagnosis not present

## 2022-07-23 DIAGNOSIS — Z0000 Encounter for general adult medical examination without abnormal findings: Secondary | ICD-10-CM | POA: Diagnosis not present

## 2022-07-23 DIAGNOSIS — E113219 Type 2 diabetes mellitus with mild nonproliferative diabetic retinopathy with macular edema, unspecified eye: Secondary | ICD-10-CM | POA: Diagnosis not present

## 2022-07-23 DIAGNOSIS — J849 Interstitial pulmonary disease, unspecified: Secondary | ICD-10-CM | POA: Diagnosis not present

## 2022-07-23 DIAGNOSIS — I5030 Unspecified diastolic (congestive) heart failure: Secondary | ICD-10-CM | POA: Diagnosis not present

## 2022-07-23 DIAGNOSIS — N1831 Chronic kidney disease, stage 3a: Secondary | ICD-10-CM | POA: Diagnosis not present

## 2022-07-23 DIAGNOSIS — E782 Mixed hyperlipidemia: Secondary | ICD-10-CM | POA: Diagnosis not present

## 2022-07-23 DIAGNOSIS — I4891 Unspecified atrial fibrillation: Secondary | ICD-10-CM | POA: Diagnosis not present

## 2022-07-24 DIAGNOSIS — M25562 Pain in left knee: Secondary | ICD-10-CM | POA: Diagnosis not present

## 2022-07-24 DIAGNOSIS — M25512 Pain in left shoulder: Secondary | ICD-10-CM | POA: Diagnosis not present

## 2022-07-24 DIAGNOSIS — M1990 Unspecified osteoarthritis, unspecified site: Secondary | ICD-10-CM | POA: Diagnosis not present

## 2022-07-24 DIAGNOSIS — J849 Interstitial pulmonary disease, unspecified: Secondary | ICD-10-CM | POA: Diagnosis not present

## 2022-07-24 DIAGNOSIS — N1831 Chronic kidney disease, stage 3a: Secondary | ICD-10-CM | POA: Diagnosis not present

## 2022-07-24 DIAGNOSIS — M79642 Pain in left hand: Secondary | ICD-10-CM | POA: Diagnosis not present

## 2022-07-24 DIAGNOSIS — Z79899 Other long term (current) drug therapy: Secondary | ICD-10-CM | POA: Diagnosis not present

## 2022-07-24 DIAGNOSIS — I4891 Unspecified atrial fibrillation: Secondary | ICD-10-CM | POA: Diagnosis not present

## 2022-07-24 DIAGNOSIS — M79641 Pain in right hand: Secondary | ICD-10-CM | POA: Diagnosis not present

## 2022-07-24 DIAGNOSIS — M25511 Pain in right shoulder: Secondary | ICD-10-CM | POA: Diagnosis not present

## 2022-07-24 DIAGNOSIS — M25561 Pain in right knee: Secondary | ICD-10-CM | POA: Diagnosis not present

## 2022-07-24 DIAGNOSIS — E1122 Type 2 diabetes mellitus with diabetic chronic kidney disease: Secondary | ICD-10-CM | POA: Diagnosis not present

## 2022-07-24 DIAGNOSIS — M0579 Rheumatoid arthritis with rheumatoid factor of multiple sites without organ or systems involvement: Secondary | ICD-10-CM | POA: Diagnosis not present

## 2022-08-07 DIAGNOSIS — M0579 Rheumatoid arthritis with rheumatoid factor of multiple sites without organ or systems involvement: Secondary | ICD-10-CM | POA: Diagnosis not present

## 2022-08-21 DIAGNOSIS — M0579 Rheumatoid arthritis with rheumatoid factor of multiple sites without organ or systems involvement: Secondary | ICD-10-CM | POA: Diagnosis not present

## 2022-08-26 DIAGNOSIS — M25551 Pain in right hip: Secondary | ICD-10-CM | POA: Diagnosis not present

## 2022-08-26 DIAGNOSIS — M542 Cervicalgia: Secondary | ICD-10-CM | POA: Diagnosis not present

## 2022-08-26 DIAGNOSIS — R079 Chest pain, unspecified: Secondary | ICD-10-CM | POA: Diagnosis not present

## 2022-08-26 DIAGNOSIS — R296 Repeated falls: Secondary | ICD-10-CM | POA: Diagnosis not present

## 2022-08-26 DIAGNOSIS — R269 Unspecified abnormalities of gait and mobility: Secondary | ICD-10-CM | POA: Diagnosis not present

## 2022-08-26 DIAGNOSIS — Z043 Encounter for examination and observation following other accident: Secondary | ICD-10-CM | POA: Diagnosis not present

## 2022-08-26 DIAGNOSIS — S79911A Unspecified injury of right hip, initial encounter: Secondary | ICD-10-CM | POA: Diagnosis not present

## 2022-08-26 DIAGNOSIS — Z743 Need for continuous supervision: Secondary | ICD-10-CM | POA: Diagnosis not present

## 2022-08-26 DIAGNOSIS — R531 Weakness: Secondary | ICD-10-CM | POA: Diagnosis not present

## 2022-08-26 DIAGNOSIS — S8991XA Unspecified injury of right lower leg, initial encounter: Secondary | ICD-10-CM | POA: Diagnosis not present

## 2022-08-26 DIAGNOSIS — Z7901 Long term (current) use of anticoagulants: Secondary | ICD-10-CM | POA: Diagnosis not present

## 2022-08-26 DIAGNOSIS — M47812 Spondylosis without myelopathy or radiculopathy, cervical region: Secondary | ICD-10-CM | POA: Diagnosis not present

## 2022-08-28 ENCOUNTER — Encounter (HOSPITAL_COMMUNITY): Payer: Self-pay | Admitting: *Deleted

## 2022-09-02 ENCOUNTER — Ambulatory Visit: Payer: Medicare HMO | Attending: Cardiovascular Disease | Admitting: Cardiovascular Disease

## 2022-09-02 NOTE — Progress Notes (Deleted)
Electrophysiology Office Note:    Date:  09/02/2022   ID:  Alison Bell, DOB [birth year], MRN [id number]  PCP:  Deland Pretty, [place] Providers Cardiologist:  None { Click to update primary MD,subspecialty MD or APP then REFRESH:1}    Referring MD: Holland Commons, FNP   History of Present Illness:    Alison Bell is a 77 y.o. female with a hx listed below, significant for AF, TdP while on Tikosyn who presents for EP follow-up.  She underwent ablation for AF in 2018.    Past Medical History:  Diagnosis Date   Anemia    Arthritis    rheumatoid   Atrial fibrillation (HCC)    CKD (chronic kidney disease)    Colon adenoma    Depression    Diabetes mellitus without complication (HCC)    Diastolic heart failure (HCC)    Difficulty sleeping    GERD (gastroesophageal reflux disease)    Headache    migraines   History of blood transfusion    HTN (hypertension)    Hyperlipidemia    Insomnia    LV dysfunction, EF 45-50% due to a. fib per echo 08/27/12  08/28/2012   a. Echo (10/15):  EF 55-60%, no RWMA, Gr 2 DD, mild MR, mod LAE, normal RVF, mild RAE, mild TR (LA 44 mm)   Memory difficulty    Neuropathy    Persistent atrial fibrillation (HCC)    Rheumatoid arthritis ([place]) 10/11/2018   RLS (restless legs syndrome)    Torsades de pointes ([place])    due to Tikosyn 04/2014    Past Surgical History:  Procedure Laterality Date   ATRIAL FIBRILLATION ABLATION N/A 11/02/2012   PVI by Dr [name]   ATRIAL FIBRILLATION ABLATION N/A 03/17/2017   Procedure: Atrial Fibrillation Ablation;  Surgeon: [name], MD;  Location: [place] [facility];  Service: Cardiovascular;  Laterality: N/A;   CARDIOVERSION N/A 09/06/2012   Procedure: CARDIOVERSION;  Surgeon: [name], MD;  Location: [place];  Service: Cardiovascular;  Laterality: N/A;   CARDIOVERSION N/A 01/07/2013   Procedure: CARDIOVERSION;  Surgeon: [name], MD;  Location: [facility] ENDOSCOPY;  Service:  Cardiovascular;  Laterality: N/A;   CARDIOVERSION N/A 11/14/2013   Procedure: CARDIOVERSION;  Surgeon: [name], MD;  Location: [facility] ENDOSCOPY;  Service: Cardiovascular;  Laterality: N/A;   CARDIOVERSION N/A 01/19/2017   Procedure: CARDIOVERSION;  Surgeon: [name], MD;  Location: [facility] ENDOSCOPY;  Service: Cardiovascular;  Laterality: N/A;   COLONOSCOPY N/A 07/28/2014   Procedure: COLONOSCOPY;  Surgeon: Ladene Artist, MD;  Location: [facility] ENDOSCOPY;  Service: Endoscopy;  Laterality: N/A;   LAPAROSCOPIC SIGMOID COLECTOMY N/A 08/08/2014   Procedure: LAPAROSCOPIC SIGMOID COLECTOMY [facility] PROCTOSCOPY;  Surgeon: Excell Seltzer, MD;  Location: [facility];  Service: General;  Laterality: N/A;   TEE WITHOUT CARDIOVERSION N/A 09/06/2012   Procedure: TRANSESOPHAGEAL ECHOCARDIOGRAM (TEE);  Surgeon: [name], MD;  Location: [facility] ENDOSCOPY;  Service: Cardiovascular;  Laterality: N/A;   TEE WITHOUT CARDIOVERSION N/A 11/01/2012   Procedure: TRANSESOPHAGEAL ECHOCARDIOGRAM (TEE);  Surgeon: [name], MD;  Location: [facility] ENDOSCOPY;  Service: Cardiovascular;  Laterality: N/A;   TUBAL LIGATION     bilateral    Current Medications: No outpatient medications have been marked as taking for the 09/02/22 encounter (Appointment) with Angelisse Riso, Yetta Barre, MD.     Allergies:   Altace [ramipril], Benicar [olmesartan], Latex, Other, Tikosyn [dofetilide], Maxzide [hydrochlorothiazide w-triamterene], and Tekturna [aliskiren]   Social History  Socioeconomic History   Marital status: Widowed    Spouse name: Not on file   Number of children: 4   Years of education: 44   Highest education level: Not on file  Occupational History    Comment: retired  Tobacco Use   Smoking status: Former    Types: Cigarettes    Quit date: 07/21/1994    Years since quitting: 28.1   Smokeless tobacco: Never  Vaping Use   Vaping Use: Never used  Substance and Sexual Activity   Alcohol use: No   Drug use: No   Sexual  activity: Yes  Other Topics Concern   Not on file  Social History Narrative   04/23/20 Lives with grand daughter,   Retired   Four children   Caffeine -none   high school   Social Determinants of Health   Financial Resource Strain: Not on file  Food Insecurity: No [place] (06/22/2020)   Hunger Vital Sign    Worried About Running Out of Food in the Last Year: Never true    [place] in the Last Year: Never true  Transportation Needs: No Transportation Needs (06/22/2020)   PRAPARE - Hydrologist (Medical): No    Lack of Transportation (Non-Medical): No  Physical Activity: Not on file  Stress: Not on file  Social Connections: Not on file     Family History: The patient's family history includes Breast cancer in her mother; Diabetes type II in her father; Heart attack in her maternal grandfather, maternal grandmother, mother, and paternal grandmother; Hypertension in her father; Throat cancer in her brother.  ROS:   Please see the history of present illness.    All other systems reviewed and are negative.  EKGs/Labs/Other Studies Reviewed Today:     EKG:  Last EKG results: ***   Recent Labs: No results found for requested labs within last 365 days.     Physical Exam:    VS:  There were no vitals taken for this visit.    Wt Readings from Last 3 Encounters:  05/23/20 195 lb (88.5 kg)  04/23/20 193 lb 12.8 oz (87.9 kg)  04/13/19 206 lb 6.4 oz (93.6 kg)     GEN: Well nourished, well developed in no acute distress CARDIAC: RRR, no murmurs, rubs, gallops RESPIRATORY:  Normal work of breathing MUSCULOSKELETAL: no edema    ASSESSMENT & PLAN:    Atrial fibrillation: maintaining sinus thyrhm since ablation Obesity:         Medication Adjustments/Labs and Tests Ordered: Current medicines are reviewed at length with the patient today.  Concerns regarding medicines are outlined above.  No orders of the defined types were  placed in this encounter.  No orders of the defined types were placed in this encounter.    Signed, Melida Quitter, MD  09/02/2022 3:16 PM    [place]
